# Patient Record
Sex: Male | Born: 1950 | Hispanic: Refuse to answer | Smoking: Never smoker
Health system: Southern US, Community
[De-identification: ages and names within clinical notes are randomized; demographics above are authoritative.]

## PROBLEM LIST (undated history)

## (undated) DIAGNOSIS — M1990 Unspecified osteoarthritis, unspecified site: Secondary | ICD-10-CM

## (undated) DIAGNOSIS — R569 Unspecified convulsions: Secondary | ICD-10-CM

## (undated) DIAGNOSIS — I10 Essential (primary) hypertension: Secondary | ICD-10-CM

## (undated) DIAGNOSIS — D649 Anemia, unspecified: Secondary | ICD-10-CM

## (undated) DIAGNOSIS — M2550 Pain in unspecified joint: Secondary | ICD-10-CM

## (undated) DIAGNOSIS — C61 Malignant neoplasm of prostate: Secondary | ICD-10-CM

## (undated) DIAGNOSIS — M109 Gout, unspecified: Secondary | ICD-10-CM

## (undated) HISTORY — DX: Anemia, unspecified: D64.9

## (undated) HISTORY — DX: Essential (primary) hypertension: I10

## (undated) HISTORY — DX: Pain in unspecified joint: M25.50

## (undated) HISTORY — PX: PROSTATE BIOPSY: SHX241

## (undated) HISTORY — DX: Malignant neoplasm of prostate: C61

## (undated) HISTORY — DX: Gout, unspecified: M10.9

## (undated) HISTORY — DX: Unspecified convulsions: R56.9

## (undated) HISTORY — DX: Unspecified osteoarthritis, unspecified site: M19.90

## (undated) HISTORY — PX: EYE SURGERY: SHX253

---

## 2014-02-01 ENCOUNTER — Encounter: Payer: Self-pay | Admitting: Internal Medicine

## 2014-04-11 ENCOUNTER — Telehealth: Payer: Self-pay | Admitting: Internal Medicine

## 2014-04-11 ENCOUNTER — Ambulatory Visit: Payer: Self-pay | Admitting: Internal Medicine

## 2014-04-11 NOTE — Telephone Encounter (Signed)
No charge. 

## 2014-04-11 NOTE — Telephone Encounter (Signed)
Dr Henrene Pastor do you want to charge the pt?

## 2014-04-15 ENCOUNTER — Emergency Department (HOSPITAL_COMMUNITY)
Admission: EM | Admit: 2014-04-15 | Discharge: 2014-04-15 | Disposition: A | Discharge status: Home / Self Care | Payer: Medicaid Other | Attending: Emergency Medicine | Admitting: Emergency Medicine

## 2014-04-15 ENCOUNTER — Encounter (HOSPITAL_COMMUNITY): Payer: Self-pay | Admitting: Emergency Medicine

## 2014-04-15 DIAGNOSIS — D649 Anemia, unspecified: Secondary | ICD-10-CM | POA: Diagnosis not present

## 2014-04-15 DIAGNOSIS — Z79899 Other long term (current) drug therapy: Secondary | ICD-10-CM | POA: Insufficient documentation

## 2014-04-15 DIAGNOSIS — I10 Essential (primary) hypertension: Secondary | ICD-10-CM | POA: Insufficient documentation

## 2014-04-15 DIAGNOSIS — Z791 Long term (current) use of non-steroidal anti-inflammatories (NSAID): Secondary | ICD-10-CM | POA: Insufficient documentation

## 2014-04-15 DIAGNOSIS — R569 Unspecified convulsions: Secondary | ICD-10-CM | POA: Insufficient documentation

## 2014-04-15 DIAGNOSIS — F17200 Nicotine dependence, unspecified, uncomplicated: Secondary | ICD-10-CM | POA: Diagnosis not present

## 2014-04-15 LAB — URINE MICROSCOPIC-ADD ON

## 2014-04-15 LAB — CBC WITH DIFFERENTIAL/PLATELET
BASOS ABS: 0.1 10*3/uL (ref 0.0–0.1)
Basophils Relative: 1 % (ref 0–1)
EOS ABS: 0.1 10*3/uL (ref 0.0–0.7)
Eosinophils Relative: 1 % (ref 0–5)
HCT: 42.1 % (ref 39.0–52.0)
Hemoglobin: 14.5 g/dL (ref 13.0–17.0)
LYMPHS PCT: 21 % (ref 12–46)
Lymphs Abs: 2.2 10*3/uL (ref 0.7–4.0)
MCH: 36.8 pg — ABNORMAL HIGH (ref 26.0–34.0)
MCHC: 34.4 g/dL (ref 30.0–36.0)
MCV: 106.9 fL — ABNORMAL HIGH (ref 78.0–100.0)
Monocytes Absolute: 1.3 10*3/uL — ABNORMAL HIGH (ref 0.1–1.0)
Monocytes Relative: 13 % — ABNORMAL HIGH (ref 3–12)
NEUTROS PCT: 64 % (ref 43–77)
Neutro Abs: 6.7 10*3/uL (ref 1.7–7.7)
PLATELETS: 153 10*3/uL (ref 150–400)
RBC: 3.94 MIL/uL — ABNORMAL LOW (ref 4.22–5.81)
RDW: 14.1 % (ref 11.5–15.5)
WBC: 10.3 10*3/uL (ref 4.0–10.5)

## 2014-04-15 LAB — URINALYSIS, ROUTINE W REFLEX MICROSCOPIC
Bilirubin Urine: NEGATIVE
GLUCOSE, UA: NEGATIVE mg/dL
KETONES UR: NEGATIVE mg/dL
Leukocytes, UA: NEGATIVE
Nitrite: NEGATIVE
Specific Gravity, Urine: 1.014 (ref 1.005–1.030)
Urobilinogen, UA: 1 mg/dL (ref 0.0–1.0)
pH: 5.5 (ref 5.0–8.0)

## 2014-04-15 LAB — BASIC METABOLIC PANEL
Anion gap: 35 — ABNORMAL HIGH (ref 5–15)
Anion gap: 15 (ref 5–15)
BUN: 4 mg/dL — ABNORMAL LOW (ref 6–23)
BUN: 4 mg/dL — AB (ref 6–23)
CALCIUM: 8.5 mg/dL (ref 8.4–10.5)
CHLORIDE: 100 meq/L (ref 96–112)
CO2: 11 mEq/L — ABNORMAL LOW (ref 19–32)
CO2: 23 meq/L (ref 19–32)
CREATININE: 0.86 mg/dL (ref 0.50–1.35)
Calcium: 9.1 mg/dL (ref 8.4–10.5)
Chloride: 90 mEq/L — ABNORMAL LOW (ref 96–112)
Creatinine, Ser: 0.96 mg/dL (ref 0.50–1.35)
GFR calc Af Amer: >90 mL/min (ref >90)
GFR calc Af Amer: >90 mL/min (ref >90)
GFR calc non Af Amer: 90 mL/min — ABNORMAL LOW (ref >90)
GFR, EST NON AFRICAN AMERICAN: 86 mL/min — AB (ref >90)
GLUCOSE: 230 mg/dL — AB (ref 70–99)
GLUCOSE: 102 mg/dL — AB (ref 70–99)
POTASSIUM: 3.4 meq/L — AB (ref 3.7–5.3)
Potassium: 3.4 mEq/L — ABNORMAL LOW (ref 3.7–5.3)
SODIUM: 136 meq/L — AB (ref 137–147)
Sodium: 138 mEq/L (ref 137–147)

## 2014-04-15 MED ORDER — SODIUM CHLORIDE 0.9 % IV BOLUS (SEPSIS)
1000.0000 mL | Freq: Once | INTRAVENOUS | Status: AC
  Administered 2014-04-15: 1000 mL via INTRAVENOUS

## 2014-04-15 MED ORDER — LORAZEPAM 2 MG/ML IJ SOLN
1.0000 mg | Freq: Once | INTRAMUSCULAR | Status: AC
  Administered 2014-04-15: 1 mg via INTRAVENOUS
  Filled 2014-04-15: qty 1

## 2014-04-15 NOTE — ED Notes (Signed)
Patient denies pain and is resting comfortably.  

## 2014-04-15 NOTE — ED Notes (Signed)
Patient is resting comfortably. 

## 2014-04-15 NOTE — ED Notes (Signed)
Pt provided with urinal for sample.

## 2014-04-15 NOTE — ED Provider Notes (Signed)
CSN: [id number]     Arrival date & time 04/15/14  6333 History   First MD Initiated Contact with Patient 04/15/14 [phone]     Chief Complaint  Patient presents with  . Seizures  . Hypertension     (Consider location/radiation/quality/duration/timing/severity/associated sxs/prior Treatment) Patient is a 63 y.o. male presenting with seizures.  Seizures Seizure activity on arrival: no   Seizure type:  Grand mal Episode characteristics: generalized shaking and unresponsiveness   Postictal symptoms: confusion   Return to baseline: no   Severity:  Severe Duration:  2 minutes Timing:  Clustered Number of seizures this episode:  2 Progression:  Resolved Context comment:  Heavy alcohol use.  has been throwing up for past two days. History of seizures: yes (friend reports no seizures in at least 3 or 4 years)     Past Medical History  Diagnosis Date  . Anemia   . Hypertension   . Arthralgia    Past Surgical History  Procedure Laterality Date  . Eye surgery      R eye removal   No family history on file. History  Substance Use Topics  . Smoking status: Current Some Day Smoker  . Smokeless tobacco: Not on file  . Alcohol Use: Yes     Comment: pint of alcohol daily    Review of Systems  Unable to perform ROS: Mental status change  Neurological: Positive for seizures.      Allergies  Review of patient's allergies indicates no known allergies.  Home Medications   Prior to Admission medications   Medication Sig Start Date End Date Taking? Authorizing Provider  diltiazem (TIAZAC) 240 MG 24 hr capsule Take 240 mg by mouth daily.    Historical Provider, MD  ferrous sulfate 325 (65 FE) MG tablet Take 325 mg by mouth daily with breakfast.    Historical Provider, MD  lisinopril (PRINIVIL,ZESTRIL) 20 MG tablet Take 20 mg by mouth daily.    Historical Provider, MD  naproxen (NAPROSYN) 500 MG tablet Take 500 mg by mouth 2 (two) times daily with a meal.    Historical Provider, MD    BP 160/110  Pulse 120  Temp(Src) 98.4 F (36.9 C) (Oral)  Resp 20  SpO2 98% Physical Exam  Nursing note and vitals reviewed. Constitutional: He appears well-developed and well-nourished. No distress.  HENT:  Head: Normocephalic and atraumatic.  Mouth/Throat: Oropharynx is clear and moist.  Eyes: Conjunctivae are normal. Pupils are equal, round, and reactive to light. No scleral icterus.  Neck: Neck supple.  Cardiovascular: Normal rate, regular rhythm, normal heart sounds and intact distal pulses.   No murmur heard. Pulmonary/Chest: Effort normal and breath sounds normal. No stridor. No respiratory distress. He has no wheezes. He has no rales.  Abdominal: Soft. He exhibits no distension. There is no tenderness.  Musculoskeletal: Normal range of motion. He exhibits no edema.  Neurological: He is alert. He is disoriented.  Confused  Skin: Skin is warm and dry. No rash noted.  Psychiatric: He has a normal mood and affect. His behavior is normal.    ED Course  Procedures (including critical care time) Labs Review Labs Reviewed  CBC WITH DIFFERENTIAL - Abnormal; Notable for the following:    RBC 3.94 (*)    MCV 106.9 (*)    MCH 36.8 (*)    Monocytes Relative 13 (*)    Monocytes Absolute 1.3 (*)    All other components within normal limits  BASIC METABOLIC PANEL - Abnormal; Notable for the  following:    Sodium 136 (*)    Potassium 3.4 (*)    Chloride 90 (*)    CO2 11 (*)    Glucose, Bld 230 (*)    BUN 4 (*)    GFR calc non Af Amer 86 (*)    Anion gap 35 (*)    All other components within normal limits  URINALYSIS, ROUTINE W REFLEX MICROSCOPIC - Abnormal; Notable for the following:    Hgb urine dipstick SMALL (*)    Protein, ur >300 (*)    All other components within normal limits  URINE MICROSCOPIC-ADD ON - Abnormal; Notable for the following:    Casts HYALINE CASTS (*)    All other components within normal limits  BASIC METABOLIC PANEL - Abnormal; Notable for the  following:    Potassium 3.4 (*)    Glucose, Bld 102 (*)    BUN 4 (*)    GFR calc non Af Amer 90 (*)    All other components within normal limits    Imaging Review No results found.   EKG Interpretation   Date/Time:  Friday April 15 2014 09:07:41 EDT Ventricular Rate:  120 PR Interval:  167 QRS Duration: 85 QT Interval:  327 QTC Calculation: 462 R Axis:   33 Text Interpretation:  Sinus tachycardia Probable left atrial enlargement  No old tracing to compare Confirmed by [facility]  MD, TREY ([id number]) on  04/15/2014 11:06:50 AM      MDM   Final diagnoses:  Seizure    63 yo male with a history of alcoholism and alcohol related seizures who presents after two witnessed seizures.  Pt confused but alert on my initial exam.  I called his listed home phone number and spoke with Judd Gaudier, who says he knows Mr Mierzejewski well and witnessed the seizure this morning.  He saw generalized shaking and loss of consciousness.  EMS then found him post ictal and witnessed a second generalized TC seizure.   3:39 PM Pt has returned to baseline mental status.  He and his friend report that he has been suffering from a vomiting and diarrheal illness for past few days.  I suspect he has had decreased alcohol intake secondary to this, causing his seizures.  His labs initially showed signs of metabolic acidosis, felt to be secondary to seizure and dehydration.  Repeat labs showed improvement.  He appears stable for dc with outpatient follow up.    Houston Siren III, MD 04/15/14 [phone]

## 2014-04-15 NOTE — Discharge Instructions (Signed)
Seizure, Adult °A seizure is abnormal electrical activity in the brain. Seizures usually last from 30 seconds to 2 minutes. There are various types of seizures. °Before a seizure, you may have a warning sensation (aura) that a seizure is about to occur. An aura may include the following symptoms:  °· Fear or anxiety. °· Nausea. °· Feeling like the room is spinning (vertigo). °· Vision changes, such as seeing flashing lights or spots. °Common symptoms during a seizure include: °· A change in attention or behavior (altered mental status). °· Convulsions with rhythmic jerking movements. °· Drooling. °· Rapid eye movements. °· Grunting. °· Loss of bladder and bowel control. °· Bitter taste in the mouth. °· Tongue biting. °After a seizure, you may feel confused and sleepy. You may also have an injury resulting from convulsions during the seizure. °HOME CARE INSTRUCTIONS  °· If you are given medicines, take them exactly as prescribed by your health care provider. °· Keep all follow-up appointments as directed by your health care provider. °· Do not swim or drive or engage in risky activity during which a seizure could cause further injury to you or others until your health care provider says it is OK. °· Get adequate rest. °· Teach friends and family what to do if you have a seizure. They should: °¨ Lay you on the ground to prevent a fall. °¨ Put a cushion under your head. °¨ Loosen any tight clothing around your neck. °¨ Turn you on your side. If vomiting occurs, this helps keep your airway clear. °¨ Stay with you until you recover. °¨ Know whether or not you need emergency care. °SEEK IMMEDIATE MEDICAL CARE IF: °· The seizure lasts longer than 5 minutes. °· The seizure is severe or you do not wake up immediately after the seizure. °· You have an altered mental status after the seizure. °· You are having more frequent or worsening seizures. °Someone should drive you to the emergency department or call local emergency  services (911 in [place] °MAKE SURE YOU: °· Understand these instructions. °· Will watch your condition. °· Will get help right away if you are not doing well or get worse. °Document Released: 08/02/2000 Document Revised: 05/26/2013 Document Reviewed: 03/17/2013 °ExitCare® Patient Information ©2015 [employer]. This information is not intended to replace advice given to you by your health care provider. Make sure you discuss any questions you have with your health care provider. ° °Emergency Department Resource Guide °1) Find a Doctor and Pay Out of Pocket °Although you won't have to find out who is covered by your insurance plan, it is a good idea to ask around and get recommendations. You will then need to call the office and see if the doctor you have chosen will accept you as a new patient and what types of options they offer for patients who are self-pay. Some doctors offer discounts or will set up payment plans for their patients who do not have insurance, but you will need to ask so you aren't surprised when you get to your appointment. ° °2) Contact Your Local Health Department °Not all health departments have doctors that can see patients for sick visits, but many do, so it is worth a call to see if yours does. If you don't know where your local health department is, you can check in your phone book. The CDC also has a tool to help you locate your state's health department, and many state websites also have listings of all of their   local health departments. ° °3) Find a Walk-in Clinic °If your illness is not likely to be very severe or complicated, you may want to try a walk in clinic. These are popping up all over the country in pharmacies, drugstores, and shopping centers. They're usually staffed by nurse practitioners or physician assistants that have been trained to treat common illnesses and complaints. They're usually fairly quick and inexpensive. However, if you have serious medical issues or  chronic medical problems, these are probably not your best option. ° °No Primary Care Doctor: °- Call [facility] Connect at  832-8000 - they can help you locate a primary care doctor that  accepts your insurance, provides certain services, etc. °- Physician Referral Service- 1-800-533-3463 ° °Chronic Pain Problems: °Organization         Address  Phone   Notes  °[facility]  (336) 297-2271 Patients need to be referred by their primary care doctor.  ° °Medication Assistance: °Organization         Address  Phone   Notes  °[place] [facility] [address] °[place], [place] [address] (336) 641-8030 --Must be a resident of [place] °-- Must have NO insurance coverage whatsoever (no Medicaid/ Medicare, etc.) °-- The pt. MUST have a primary care doctor that directs their care regularly and follows them in the community °  °[facility]  (866) 331-1348   °[employer]  (888) 892-1162   ° °Agencies that provide inexpensive medical care: °Organization         Address  Phone   Notes  °[facility]  (336) 832-8035   °[facility]    (336) 832-7272   °[facility] [address] °[place], [place] [address] (336) 832-4777   °[facility] [address], °[place] (336) 271-4999   °[facility]    (336) 373-0678   °[facility]    (336) 272-1050   °[facility] ° [address], [place] Phone:  (336) 832-4444, Fax:  (336) 832-4440 Hours of Operation:  9 am - 6 pm, M-F.  Also accepts Medicaid/Medicare and self-pay.  °[facility] ° [address], [place] Phone: (336) 832-3150, Fax: (336) 832-3151. Hours of Operation:  8:30 am - 5:30 pm, M-F.  Also accepts Medicaid and self-pay.  °[facility] [place] [address], [place] Phone: (336) 878-6027   °[facility] [address], [place], [place]  (336)723-1848, Ext. 123 Mondays & Thursdays: 7-9 AM.  First 15 patients are seen on a first come, first serve basis. °  ° °Medicaid-accepting [place] Providers: ° °Organization         Address  Phone   Notes  °[facility] [address], [place] (336) 641-2100 Also accepts self-pay patients.  °[facility] [address], [place] ° (336) 856-9996   °[facility] [address], [place] (336) 288-8857   °[facility] [address] [place] [address], [place] (336) 299-7000   °Veita Bland [address], [place]  ° (336) 373-1557 Only accepts [place] Access Medicaid patients after they have their name applied to their card.  ° °Self-Pay (no insurance) in [place]: ° °Organization         Address  Phone   Notes  °Sickle Cell Patients,   [facility] [address], [place] (336) 832-1970   °[facility] [address], [place] (336) 832-4400   °[facility] [place] ° [address], [place] (336) 992-4800   °[facility]/Dr. Osei-Bonsu ° [address] [place] [address], [place] or [address], [place] (336) 841-8500 Phone number for both [place] and [place] locations is the same.  °[facility] [address], [place] (336) 299-0000   °[facility] [place] [address] [place] [address], [place] or [address] (336) 852-7530 °(336) 878-2260   °[facility] [address], [place] (336) 350-1642, phone; (336) 294-5005, fax Sees patients 1st and 3rd Saturday of every month.  Must not qualify for public or private insurance (i.e. Medicaid, Medicare, [place] Health Choice, Veterans' Benefits) • Household income should be no more than 200% of the poverty level •The clinic cannot treat you if you are pregnant or think you are pregnant • Sexually transmitted  diseases are not treated at the clinic.  ° ° °Dental Care: °Organization         Address  Phone  Notes  °[facility] [address], [place] (336) 641-6152 Accepts children up to age 21 who are enrolled in Medicaid or [place] Health Choice; pregnant women with a Medicaid card; and children who have applied for Medicaid or [place] Health Choice, but were declined, whose parents can pay a reduced fee at time of service.  °[facility] [place]  [address], [place] (336) 641-7733 Accepts children up to age 21 who are enrolled in Medicaid or [place] Health Choice; pregnant women with a Medicaid card; and children who have applied for Medicaid or [place] Health Choice, but were declined, whose parents can pay a reduced fee at time of service.  °[facility] ° [address], [place] (336) 641-4533 Patients are seen by appointment only. Walk-ins are not accepted. [facility] will see patients 18 years of age and older. °Monday - Tuesday (8am-5pm) °Most Wednesdays (8:30-5pm) °$30 per visit, cash only  °[facility] ° [address], [place] (336) 641-4533 Patients are seen by appointment only. Walk-ins are not accepted. [facility] will see patients 18 years of age and older. °One Wednesday Evening (Monthly: Volunteer Based).  $30 per visit, cash only  °[facility]  (919) 537-3737 for adults; Children under age 4, call Graduate Pediatric Dentistry at (919) [phone]. Children aged 4-14, please call (919) [phone] to request a pediatric application. ° Dental services are provided in all areas of dental care including fillings, crowns and bridges, complete and partial dentures, implants, gum treatment, root canals, and extractions. Preventive care is also provided. Treatment is provided to both adults and children. °Patients are selected via a  lottery and there is often a waiting list. °  °[facility] [address], °[place] ° (336) 763-8833 [email or web address] °  °[facility] [address], [place], [place] (336)723-1848, Ext. 123 Second and Fourth Thursday of each month, opens at 6:30 AM; Clinic ends at 9 AM.  Patients are seen on a first-come first-served basis, and a limited number are seen during each clinic.  ° °[facility] ° [address], [place], [place] (336) 723-7904   Eligibility Requirements °You must   have lived in [place], or [place] counties for at least the last three months. °  You cannot be eligible for state or federal sponsored healthcare insurance, including Veterans Administration, Medicaid, or Medicare. °  You generally cannot be eligible for healthcare insurance through your employer.  °  How to apply: °Eligibility screenings are held every Tuesday and Wednesday afternoon from 1:00 pm until 4:00 pm. You do not need an appointment for the interview!  °[facility] [address], [place], [place] 336-631-2330   °[facility] [facility]  336-342-8273   °[place] [facility]  336-703-3100   °[facility]  336-570-6415   ° °Behavioral Health Resources in the Community: °Intensive Outpatient Programs °Organization         Address  Phone  Notes  °[facility] [address], [place], [place] 336-878-6098   °[facility] [address], [place], [place] 336-832-9800   °[facility] [address], [place], [place] ° 336-882-2125   °[facility] [address],  °[place], [place] 1-800-853-5163 or 336-641-4981   °Substance Abuse Resources °Organization         Address  Phone  Notes  °[facility]  336-882-2125   °[facility]  336-784-9470   °[facility]  336-285-9073   °[facility]  336-845-3988   °Residential &  Outpatient Substance Abuse Program  1-800-659-3381   °Psychological Services °Organization         Address  Phone  Notes  °[facility]  336- 832-9600   °[facility]  336- 378-7881   °[facility] [address], [place] 1-800-853-5163 or 336-641-4981   ° °Mobile Crisis Teams °Organization         Address  Phone  Notes  °[facility], Mobile Crisis Care Unit  1-877-626-1772   °Assertive °[facility] ° [address] [place], [place] 336-834-9664   °Sharon DeEsch [address] °[place] 336-554-5454   ° °Self-Help/Support Groups °Organization         Address  Phone             Notes  °[facility] [place] - variety of support groups  336- 373-1402 Call for more information  °Narcotics Anonymous (NA), [facility] [address], °[place]  2 meetings at this location  ° °Residential Treatment Programs °Organization         Address  Phone  Notes  °[facility] [address],    °[place]  1-866-801-8205   °[facility] ° [address], [place], [place] 704-293-8524   °[facility] [address], [place] 336-845-3988 Admissions: 8am-3pm M-F  °[facility] [address]    °[place], [place] 336-841-1104   °[facility] [address], [place], [place] 336-379-7146   °[facility] [address]  °[place], [place] 336-285-9073   °[facility] Intensive Outpatient [address], [place], [place] 336-852-3033   °[facility] [address]  °[place], [place] 1-877-615-2722 or 336-784-9470   °[facility] [address] [place], [place] 336-227-7417 Accepts Medicaid  °[facility] [address]  °[place]  1-800-659-3381 Substance Abuse/Addiction Treatment  ° °[place] Behavioral Health Resources °Organization          Address    Phone  Notes  °[facility]  (888) 581-9988   °Julie Brannon, PhD [address] [place], [place]   (336) 349-5553 or (336) 951-0000   °[facility]   [address] °[place], [place] (336) 349-4454   °[facility] [address], [place], [place] (336) 342-8316 Insurance/Medicaid/sponsorship through [facility]  °[facility] [address]                                    [place], [place] (336) 342-8316 Therapy/tele-psych/case  °[facility] [address]  ° [place], [place] (336) 349-2233    °Dr. Arfeen  (336) 349-4544   °[facility] [place]  [employer] [place] [facility] 1) [address],  °2) [address], [place] °3)  [address], [place] (336) 349-3220 °(336) 342-7768 ° °(336) 342-8140   °[place] [facility] (336) 342-1394 or (336) 342-3537 (After Hours)    ° ° °

## 2014-04-15 NOTE — ED Notes (Signed)
MD at bedside. 

## 2014-04-15 NOTE — ED Notes (Signed)
Called pt friend to come and answer questions and take pt home per Dr. Doy Mince

## 2014-04-15 NOTE — ED Notes (Addendum)
Pt post-ictal and having a hard time answering questions. Pt had episode of urinary incontinence. Dr Doy Mince at bedside. Pt states that he has a seizure and then goes back to bed approx every week. Pt unaware of year, month or president.Pt oriented to self, DOB and states that he is in a "doctor's office." Pt denies pain and is in NAD

## 2014-04-15 NOTE — ED Notes (Addendum)
Pt from home via EMS-Per EMS pt was post-ictal upon arrival and then had another seizure that lasted approx 2 minutes. Pt was hypertensive on scene. Pt has hx of seizures, HTN and alcoholism.

## 2014-04-15 NOTE — ED Notes (Signed)
Bed: ML46 Expected date:  Expected time:  Means of arrival:  Comments: EMS-SZ/HTN

## 2014-04-15 NOTE — ED Notes (Signed)
Judd Gaudier [phone] requests to be called when pt is ready to be d/c so that he may pick him up

## 2014-04-15 NOTE — ED Notes (Signed)
Pt family and Dr Doy Mince at bedside

## 2014-06-13 ENCOUNTER — Ambulatory Visit: Payer: Self-pay | Admitting: Internal Medicine

## 2014-06-27 NOTE — Progress Notes (Signed)
     HPI: 63 yo male for evaluation of murmur.  Current Outpatient Prescriptions  Medication Sig Dispense Refill  . diltiazem (TIAZAC) 240 MG 24 hr capsule Take 240 mg by mouth daily.    . ferrous sulfate 325 (65 FE) MG tablet Take 325 mg by mouth daily with breakfast.    . lisinopril (PRINIVIL,ZESTRIL) 20 MG tablet Take 20 mg by mouth daily.    . naproxen (NAPROSYN) 500 MG tablet Take 500 mg by mouth 2 (two) times daily with a meal.     No current facility-administered medications for this visit.    No Known Allergies  Past Medical History  Diagnosis Date  . Anemia   . Hypertension   . Arthralgia     Past Surgical History  Procedure Laterality Date  . Eye surgery      R eye removal    History   Social History  . Marital Status: Legally Separated    Spouse Name: N/A    Number of Children: N/A  . Years of Education: N/A   Occupational History  . Not on file.   Social History Main Topics  . Smoking status: Current Some Day Smoker  . Smokeless tobacco: Not on file  . Alcohol Use: Yes     Comment: pint of alcohol daily  . Drug Use: Not on file  . Sexual Activity: Not on file   Other Topics Concern  . Not on file   Social History Narrative  . No narrative on file    No family history on file.  ROS: no fevers or chills, productive cough, hemoptysis, dysphasia, odynophagia, melena, hematochezia, dysuria, hematuria, rash, seizure activity, orthopnea, PND, pedal edema, claudication. Remaining systems are negative.  Physical Exam:   There were no vitals taken for this visit.  General:  Well developed/well nourished in NAD Skin warm/dry Patient not depressed No peripheral clubbing Back-normal HEENT-normal/normal eyelids Neck supple/normal carotid upstroke bilaterally; no bruits; no JVD; no thyromegaly chest - CTA/ normal expansion CV - RRR/normal S1 and S2; no murmurs, rubs or gallops;  PMI nondisplaced Abdomen -NT/ND, no HSM, no mass, + bowel sounds, no  bruit 2+ femoral pulses, no bruits Ext-no edema, chords, 2+ DP Neuro-grossly nonfocal  ECG    This encounter was created in error - please disregard.

## 2014-06-28 ENCOUNTER — Encounter: Payer: Medicaid Other | Admitting: Cardiology

## 2014-07-01 ENCOUNTER — Telehealth: Payer: Self-pay | Admitting: Cardiology

## 2014-07-04 ENCOUNTER — Telehealth: Payer: Self-pay | Admitting: Cardiology

## 2014-07-04 NOTE — Telephone Encounter (Signed)
Received records from [facility] (Dr Kevan Ny) for appointment with Dr Stanford Breed on 09/01/14.  Records given to [facility] (medical records) for Dr Jacalyn Lefevre schedule on 09/01/14.  lp

## 2014-07-12 NOTE — Telephone Encounter (Signed)
Closed encounter °

## 2014-08-30 NOTE — Progress Notes (Signed)
     HPI: 64 yo male for evaluation of murmur. No prior cardiac history.  Current Outpatient Prescriptions  Medication Sig Dispense Refill  . diltiazem (TIAZAC) 240 MG 24 hr capsule Take 240 mg by mouth daily.    . ferrous sulfate 325 (65 FE) MG tablet Take 325 mg by mouth daily with breakfast.    . lisinopril (PRINIVIL,ZESTRIL) 20 MG tablet Take 20 mg by mouth daily.    . naproxen (NAPROSYN) 500 MG tablet Take 500 mg by mouth 2 (two) times daily with a meal.     No current facility-administered medications for this visit.    No Known Allergies  Past Medical History  Diagnosis Date  . Anemia   . Hypertension   . Arthralgia     Past Surgical History  Procedure Laterality Date  . Eye surgery      R eye removal    History   Social History  . Marital Status: Legally Separated    Spouse Name: N/A    Number of Children: N/A  . Years of Education: N/A   Occupational History  . Not on file.   Social History Main Topics  . Smoking status: Current Some Day Smoker  . Smokeless tobacco: Not on file  . Alcohol Use: Yes     Comment: pint of alcohol daily  . Drug Use: Not on file  . Sexual Activity: Not on file   Other Topics Concern  . Not on file   Social History Narrative  . No narrative on file    No family history on file.  ROS: no fevers or chills, productive cough, hemoptysis, dysphasia, odynophagia, melena, hematochezia, dysuria, hematuria, rash, seizure activity, orthopnea, PND, pedal edema, claudication. Remaining systems are negative.  Physical Exam:   There were no vitals taken for this visit.  General:  Well developed/well nourished in NAD Skin warm/dry Patient not depressed No peripheral clubbing Back-normal HEENT-normal/normal eyelids Neck supple/normal carotid upstroke bilaterally; no bruits; no JVD; no thyromegaly chest - CTA/ normal expansion CV - RRR/normal S1 and S2; no murmurs, rubs or gallops;  PMI nondisplaced Abdomen -NT/ND, no HSM, no  mass, + bowel sounds, no bruit 2+ femoral pulses, no bruits Ext-no edema, chords, 2+ DP Neuro-grossly nonfocal  ECG    This encounter was created in error - please disregard.

## 2014-09-01 ENCOUNTER — Encounter: Payer: Medicaid Other | Admitting: Cardiology

## 2014-09-14 ENCOUNTER — Encounter: Payer: Self-pay | Admitting: Cardiology

## 2014-09-15 ENCOUNTER — Other Ambulatory Visit (HOSPITAL_COMMUNITY): Payer: Self-pay | Admitting: Urology

## 2014-09-15 DIAGNOSIS — C61 Malignant neoplasm of prostate: Secondary | ICD-10-CM

## 2014-09-23 ENCOUNTER — Encounter (HOSPITAL_COMMUNITY)
Admission: RE | Admit: 2014-09-23 | Discharge: 2014-09-23 | Disposition: A | Discharge status: Home / Self Care | Payer: Medicaid Other | Source: Ambulatory Visit | Attending: Urology | Admitting: Urology

## 2014-09-23 DIAGNOSIS — C61 Malignant neoplasm of prostate: Secondary | ICD-10-CM | POA: Diagnosis not present

## 2014-09-23 DIAGNOSIS — M25561 Pain in right knee: Secondary | ICD-10-CM | POA: Insufficient documentation

## 2014-09-23 DIAGNOSIS — M25562 Pain in left knee: Secondary | ICD-10-CM | POA: Diagnosis not present

## 2014-09-23 MED ORDER — TECHNETIUM TC 99M MEDRONATE IV KIT
26.3000 | PACK | Freq: Once | INTRAVENOUS | Status: AC | PRN
  Administered 2014-09-23: 26.3 via INTRAVENOUS

## 2014-10-26 ENCOUNTER — Other Ambulatory Visit (HOSPITAL_COMMUNITY): Payer: Self-pay | Admitting: Urology

## 2014-10-26 ENCOUNTER — Ambulatory Visit (HOSPITAL_COMMUNITY)
Admission: RE | Admit: 2014-10-26 | Discharge: 2014-10-26 | Disposition: A | Discharge status: Home / Self Care | Payer: Medicaid Other | Source: Ambulatory Visit | Attending: Urology | Admitting: Urology

## 2014-10-26 DIAGNOSIS — C61 Malignant neoplasm of prostate: Secondary | ICD-10-CM

## 2014-10-26 DIAGNOSIS — M5136 Other intervertebral disc degeneration, lumbar region: Secondary | ICD-10-CM | POA: Insufficient documentation

## 2014-10-26 DIAGNOSIS — M545 Low back pain: Secondary | ICD-10-CM | POA: Insufficient documentation

## 2014-10-26 DIAGNOSIS — Z8546 Personal history of malignant neoplasm of prostate: Secondary | ICD-10-CM | POA: Diagnosis not present

## 2014-10-26 DIAGNOSIS — M419 Scoliosis, unspecified: Secondary | ICD-10-CM | POA: Diagnosis not present

## 2014-10-28 ENCOUNTER — Encounter: Payer: Self-pay | Admitting: Radiation Oncology

## 2014-10-28 NOTE — Progress Notes (Signed)
GU Location of Tumor / Histology: prostatic adenocarcinoma  If Prostate Cancer, Gleason Score is (3 + 4) and PSA is (13.56)  Shawn Hendrix presented 06/22/2014 with an elevated PSA of 13.58. [facility] referred patient to Dr. Janice Norrie.  Biopsies of prostate (if applicable) revealed:    Past/Anticipated interventions by urology, if any: patient not interested in radial prostatectomy thus was referred to Dr. Tammi Klippel for consideration of radiotherapy  Past/Anticipated interventions by medical oncology, if any: no  Weight changes, if any: no  Bowel/Bladder complaints, if any: yes, nocturia x3. Denies dysuria or hematuria.   Nausea/Vomiting, if any: no  Pain issues, if any:  no  SAFETY ISSUES:  Prior radiation? no  Pacemaker/ICD? no  Possible current pregnancy? no  Is the patient on methotrexate? no  Current Complaints / other details:  64 year old male. Divorced. Father of one daughter. PROSTATE VOLUME 25.19. Bone scan negative.

## 2014-10-31 ENCOUNTER — Ambulatory Visit
Admission: RE | Admit: 2014-10-31 | Discharge: 2014-10-31 | Disposition: A | Discharge status: Home / Self Care | Payer: Medicare Other | Source: Ambulatory Visit | Attending: Radiation Oncology | Admitting: Radiation Oncology

## 2014-10-31 ENCOUNTER — Encounter: Payer: Self-pay | Admitting: Radiation Oncology

## 2014-10-31 VITALS — BP 143/98 | HR 87 | Temp 98.0°F | Resp 16 | Ht 62.0 in | Wt 115.1 lb

## 2014-10-31 DIAGNOSIS — C61 Malignant neoplasm of prostate: Secondary | ICD-10-CM | POA: Insufficient documentation

## 2014-10-31 DIAGNOSIS — Z510 Encounter for antineoplastic radiation therapy: Secondary | ICD-10-CM | POA: Diagnosis not present

## 2014-10-31 DIAGNOSIS — D649 Anemia, unspecified: Secondary | ICD-10-CM | POA: Diagnosis not present

## 2014-10-31 DIAGNOSIS — Z791 Long term (current) use of non-steroidal anti-inflammatories (NSAID): Secondary | ICD-10-CM | POA: Insufficient documentation

## 2014-10-31 DIAGNOSIS — I10 Essential (primary) hypertension: Secondary | ICD-10-CM | POA: Insufficient documentation

## 2014-10-31 NOTE — Progress Notes (Signed)
Radiation Oncology         [phone] ________________________________  Initial outpatient Consultation  Name: Shawn Hendrix MRN: [id number]  Date: 10/31/2014  DOB: 04-01-1951  CC:No primary care provider on file.  Lowella Bandy, MD   REFERRING PHYSICIAN: Lowella Bandy, MD  DIAGNOSIS: 64 y.o. gentleman with stage T1c adenocarcinoma of the prostate with a Gleason's score of 3+4 and a PSA of 13.56    ICD-9-CM ICD-10-CM   1. Prostate ca 185 C61   2. Malignant neoplasm of prostate [place] ILLNESS::Shawn Hendrix is a 64 y.o. gentleman.  He was noted to have an elevated PSA of 13.56 by his primary care physician, at the [facility].  Accordingly, he was referred for evaluation in urology by Dr. Janice Norrie on 08/01/14,  digital rectal examination was performed at that time revealing a 3+ gland with no nodules.  The patient proceeded to transrectal ultrasound with 12 biopsies of the prostate on 09/07/14.  The prostate volume measured 25.19 cc.  Out of 12 core biopsies, 11 were positive.  The maximum Gleason score was 3+4.  The patient reviewed the biopsy results with his urologist and he has kindly been referred today for discussion of potential radiation treatment options.  PREVIOUS RADIATION THERAPY: No  PAST MEDICAL HISTORY:  has a past medical history of Anemia; Hypertension; Arthralgia; Prostate cancer; Arthritis; Gout; and Seizure.    PAST SURGICAL HISTORY: Past Surgical History  Procedure Laterality Date  . Eye surgery      R eye removal  . Prostate biopsy      FAMILY HISTORY: family history includes Cancer in his sister and sister.  SOCIAL HISTORY:  reports that he has never smoked. He has never used smokeless tobacco. He reports that he drinks alcohol. He reports that he does not use illicit drugs.  ALLERGIES: Review of patient's allergies indicates no known allergies.  MEDICATIONS:  Current Outpatient Prescriptions  Medication Sig Dispense  Refill  . diltiazem (TIAZAC) 240 MG 24 hr capsule Take 240 mg by mouth daily.    . ferrous sulfate 325 (65 FE) MG tablet Take 325 mg by mouth daily with breakfast.    . lisinopril (PRINIVIL,ZESTRIL) 20 MG tablet Take 20 mg by mouth daily.    . meloxicam (MOBIC) 7.5 MG tablet Take 7.5 mg by mouth daily.  3  . naproxen (NAPROSYN) 500 MG tablet Take 500 mg by mouth 2 (two) times daily with a meal.    . VOLTAREN 1 % GEL   1   No current facility-administered medications for this encounter.    REVIEW OF SYSTEMS:  A 15 point review of systems is documented in the electronic medical record. This was obtained by the nursing staff. However, I reviewed this with the patient to discuss relevant findings and make appropriate changes.  A comprehensive review of systems was negative..  The patient completed an IPSS and IIEF questionnaire.  His IPSS score was 1 indicating mild urinary outflow obstructive symptoms.  He indicated that his erectile function is able to complete sexual activity much less than half the time.   PHYSICAL EXAM: This patient is in no acute distress.  He is alert and oriented.   height is 5\' 2"  (1.575 m) and weight is 115 lb 1.6 oz (52.209 kg). His oral temperature is 98 F (36.7 C). His blood pressure is 143/98 and his pulse is 87. His respiration is 16 and oxygen saturation is 100%.  He  exhibits no respiratory distress or labored breathing.  He appears neurologically intact.  His mood is pleasant.  His affect is appropriate.  Please note the digital rectal exam findings described above.  KPS = 100  100 - Normal; no complaints; no evidence of disease. 90   - Able to carry on normal activity; minor signs or symptoms of disease. 80   - Normal activity with effort; some signs or symptoms of disease. 70   - Cares for self; unable to carry on normal activity or to do active work. 60   - Requires occasional assistance, but is able to care for most of his personal needs. 50   - Requires  considerable assistance and frequent medical care. 50   - Disabled; requires special care and assistance. 56   - Severely disabled; hospital admission is indicated although death not imminent. 73   - Very sick; hospital admission necessary; active supportive treatment necessary. 10   - Moribund; fatal processes progressing rapidly. 0     - Dead  Karnofsky DA, Abelmann [place], Craver LS and Burchenal [facility] [phone]) The use of the nitrogen mustards in the palliative treatment of carcinoma: with particular reference to bronchogenic carcinoma Cancer 1 634-56   LABORATORY DATA:  Lab Results  Component Value Date   WBC 10.3 04/15/2014   HGB 14.5 04/15/2014   HCT 42.1 04/15/2014   MCV 106.9* 04/15/2014   PLT 153 04/15/2014   Lab Results  Component Value Date   NA 138 04/15/2014   K 3.4* 04/15/2014   CL 100 04/15/2014   CO2 23 04/15/2014   No results found for: ALT, AST, GGT, ALKPHOS, BILITOT   RADIOGRAPHY: Dg Lumbar Spine Complete  10/26/2014   CLINICAL DATA:  History of prostate carcinoma, left-sided low back pain  EXAM: LUMBAR SPINE - COMPLETE 4+ VIEW  COMPARISON:  Bone scan of 09/23/2014  FINDINGS: There is a lumbar scoliosis convex to the left by approximately 19 degrees. Degenerative disc disease is noted at T12-L1, L1-2, and L2-3 levels with some loss of disc space and osteophyte formation with sclerosis. These degenerative changes account for the increased activity noted in the mid upper lumbar spine by bone scan. However no lytic or blastic lesion is seen and no compression deformity is noted. The SI joints are corticated.  IMPRESSION: 1. Degenerative change at T12-L1, L1-2, and L2-3 levels with loss of disc space and spurring with sclerosis. 2. Lumbar scoliosis convex left by 19 degrees. 3. No lytic or blastic bony lesion is seen.   Electronically Signed   By: Ivar Drape M.D.   On: 10/26/2014 09:02      IMPRESSION: This gentleman is a 64 y.o. gentleman with stage T1c adenocarcinoma of the  prostate with a Gleason's score of 3+4 and a PSA of 13.56.  His Gleason's Score and PSA both put him into the intermediate risk group.  Accordingly he is eligible for a variety of potential treatment options including radical prostatectomy, or externally radiotherapy with or without androgen deprivation, with or without prostate seed implant boost.  PLAN:Today I reviewed the findings and workup thus far.  We discussed the natural history of prostate cancer.  We reviewed the the implications of T-stage, Gleason's Score, and PSA on decision-making and outcomes in prostate cancer.  We discussed radiation treatment in the management of prostate cancer with regard to the logistics and delivery of external beam radiation treatment as well as the logistics and delivery of prostate brachytherapy.  We compared and contrasted  each of these approaches and also compared these against prostatectomy.  The patient expressed interest in external beam radiotherapy.   We also spent time discussing androgen deprivation in conjunction with radiotherapy. This form of therapy is shown to provide an overall survival benefit and prostate cancer free survival benefit for high-risk patients. For intermediate risk patients, the impact of concurrent androgen deprivation is less clear. There is suggestion that androgen deprivation improves prostate cancer free survival but may have less of an impact on overall survival. In this subset of patients, selection of whether to use androgen deprivation should be tailored to the individual patient based on personal preference and quality of life in addition to prostate cancer disease factors. In this instance, I am concerned about the high volume of prostate cancer within the gland with 11 out of 12 core biopsies positive. We talked about androgen deprivation today and the possible side effects associated with this treatment. The patient is not opposed to implying a short course (4-6 months) of  androgen deprivation beginning 2 months prior to external radiation and continuing during and possibly after radiation.  The patient would like to proceed with androgen deprivation in conjunction with prostate IMRT.  I will share my findings with Dr. Janice Norrie and move forward with scheduling a discussion on androgen deprivation followed by placement of three gold fiducial markers into the prostate to proceed with IMRT 2 months after the initiation of androgen deprivation.     I enjoyed meeting with him today, and will look forward to participating in the care of this very nice gentleman.   I spent 60 minutes face to face with the patient and more than 50% of that time was spent in counseling and/or coordination of care.   ------------------------------------------------  Sheral Apley. Tammi Klippel, M.D.

## 2014-10-31 NOTE — Progress Notes (Signed)
See progress note under physician encounter. 

## 2014-11-01 ENCOUNTER — Telehealth: Payer: Self-pay | Admitting: *Deleted

## 2014-11-01 NOTE — Telephone Encounter (Signed)
CALLED PATIENT TO INFORM OF GOLD SEED PLACEMENT ON 12-09-14 - ARRIVAL TIME- 3:15 PM @ DR. Janice Norrie' OFFICE AND HIS SIM ON 12-16-14 @ 2 PM @ DR. MANNING'S OFFICE, AND DR. NESI' NURSE - RUTH WILL SET UP THIS PATIENT HORMONE INJECTION - SOMETIME THIS WEEK, RUTH WILL CALL THIS PATIENT WITH THIS APPT, SPOKE WITH PATIENT AND HE IS AWARE OF THESE APPTS.

## 2014-12-16 ENCOUNTER — Ambulatory Visit
Admission: RE | Admit: 2014-12-16 | Discharge: 2014-12-16 | Disposition: A | Discharge status: Home / Self Care | Payer: Medicare Other | Source: Ambulatory Visit | Attending: Radiation Oncology | Admitting: Radiation Oncology

## 2014-12-16 DIAGNOSIS — Z510 Encounter for antineoplastic radiation therapy: Secondary | ICD-10-CM | POA: Diagnosis not present

## 2014-12-16 DIAGNOSIS — C61 Malignant neoplasm of prostate: Secondary | ICD-10-CM

## 2014-12-16 NOTE — Progress Notes (Signed)
  Radiation Oncology         [phone] ________________________________  Name: Shawn Hendrix MRN: [id number]  Date: 12/16/2014  DOB: 1950/11/04  SIMULATION AND TREATMENT PLANNING NOTE    ICD-9-CM ICD-10-CM   1. Malignant neoplasm of prostate 185 C61     DIAGNOSIS:  64 y.o. gentleman with stage T1c adenocarcinoma of the prostate with a Gleason's score of 3+4 and a PSA of 13.56  NARRATIVE:  The patient was brought to the [facility].  Identity was confirmed.  All relevant records and images related to the planned course of therapy were reviewed.  The patient freely provided informed written consent to proceed with treatment after reviewing the details related to the planned course of therapy. The consent form was witnessed and verified by the simulation staff.  Then, the patient was set-up in a stable reproducible supine position for radiation therapy.  A vacuum lock pillow device was custom fabricated to position his legs in a reproducible immobilized position.  Then, I performed a urethrogram under sterile conditions to identify the prostatic apex.  CT images were obtained.  Surface markings were placed.  The CT images were loaded into the planning software.  Then the prostate target and avoidance structures including the rectum, bladder, bowel and hips were contoured.  Treatment planning then occurred.  The radiation prescription was entered and confirmed.  A total of 1 complex treatment device was fabricated. I have requested : Intensity Modulated Radiotherapy (IMRT) is medically necessary for this case for the following reason:  Rectal sparing.Marland Kitchen  PLAN:  The patient will receive 78 Gy in 40 fractions.  This document serves as a record of services personally performed by Tyler Pita, MD. It was created on his behalf by Darcus Austin, a trained medical scribe. The creation of this record is based on the scribe's personal observations and the provider's statements to them.  This document has been checked and approved by the attending provider.     ________________________________  Sheral Apley. Tammi Klippel, M.D.

## 2014-12-21 DIAGNOSIS — Z510 Encounter for antineoplastic radiation therapy: Secondary | ICD-10-CM | POA: Diagnosis not present

## 2014-12-22 DIAGNOSIS — Z510 Encounter for antineoplastic radiation therapy: Secondary | ICD-10-CM | POA: Diagnosis not present

## 2014-12-23 DIAGNOSIS — Z510 Encounter for antineoplastic radiation therapy: Secondary | ICD-10-CM | POA: Diagnosis not present

## 2014-12-27 ENCOUNTER — Ambulatory Visit
Admission: RE | Admit: 2014-12-27 | Discharge: 2014-12-27 | Disposition: A | Discharge status: Home / Self Care | Payer: Medicare Other | Source: Ambulatory Visit | Attending: Radiation Oncology | Admitting: Radiation Oncology

## 2014-12-27 DIAGNOSIS — Z510 Encounter for antineoplastic radiation therapy: Secondary | ICD-10-CM | POA: Diagnosis not present

## 2014-12-28 ENCOUNTER — Ambulatory Visit
Admission: RE | Admit: 2014-12-28 | Discharge: 2014-12-28 | Disposition: A | Discharge status: Home / Self Care | Payer: Medicare Other | Source: Ambulatory Visit | Attending: Radiation Oncology | Admitting: Radiation Oncology

## 2014-12-29 ENCOUNTER — Ambulatory Visit
Admission: RE | Admit: 2014-12-29 | Discharge: 2014-12-29 | Disposition: A | Discharge status: Home / Self Care | Payer: Medicare Other | Source: Ambulatory Visit | Attending: Radiation Oncology | Admitting: Radiation Oncology

## 2014-12-29 DIAGNOSIS — Z510 Encounter for antineoplastic radiation therapy: Secondary | ICD-10-CM | POA: Diagnosis not present

## 2014-12-30 ENCOUNTER — Ambulatory Visit
Admission: RE | Admit: 2014-12-30 | Discharge: 2014-12-30 | Disposition: A | Discharge status: Home / Self Care | Payer: Medicaid Other | Source: Ambulatory Visit | Attending: Radiation Oncology | Admitting: Radiation Oncology

## 2014-12-30 ENCOUNTER — Ambulatory Visit
Admission: RE | Admit: 2014-12-30 | Discharge: 2014-12-30 | Disposition: A | Discharge status: Home / Self Care | Payer: Medicare Other | Source: Ambulatory Visit | Attending: Radiation Oncology | Admitting: Radiation Oncology

## 2014-12-30 ENCOUNTER — Encounter: Payer: Self-pay | Admitting: Radiation Oncology

## 2014-12-30 VITALS — BP 173/107 | HR 90 | Resp 16 | Wt 116.7 lb

## 2014-12-30 DIAGNOSIS — Z510 Encounter for antineoplastic radiation therapy: Secondary | ICD-10-CM | POA: Diagnosis not present

## 2014-12-30 DIAGNOSIS — C61 Malignant neoplasm of prostate: Secondary | ICD-10-CM

## 2014-12-30 NOTE — Progress Notes (Signed)
  Radiation Oncology         [phone] ________________________________  Name: Shawn Hendrix MRN: [id number]  Date: 12/30/2014  DOB: 22-Feb-1951  Weekly Radiation Therapy Management    ICD-9-CM ICD-10-CM   1. Malignant neoplasm of prostate 185 C61     Current Dose: 5.85 Gy     Planned Dose:  78 Gy  Narrative . . . . . . . . The patient presents for routine under treatment assessment.                                   The patient is without complaint.                                 Set-up films were reviewed.                                 The chart was checked. Physical Findings. . .  weight is 116 lb 11.2 oz (52.935 kg). His blood pressure is 173/107 and his pulse is 90. His respiration is 16. . Weight essentially stable.  No significant changes. Impression . . . . . . . The patient is tolerating radiation. Plan . . . . . . . . . . . . Continue treatment as planned.  ________________________________  Sheral Apley. Tammi Klippel, M.D.

## 2014-12-30 NOTE — Progress Notes (Signed)
Weight stable. Denies pain. Reports nocturia x 2. Denies dysuria, hematuria, or diarrhea. Denies incontinence or leakage. Denies urgency. Denies fatigue. BP elevated. Advised patient to take BP medication when he arrives home since he forgot to take it this morning. Patient verbalized understanding. Post sim education completed.

## 2014-12-30 NOTE — Progress Notes (Signed)
Completed post sim education with the patient. Oriented patient to staff and routine of the clinic. Provided patient with RADIATION THERAPY AND YOU handbook then, reviewed pertinent information. Educated patient reference potential side effects and management such as fatigue, urinary/bladder changes and diarrhea. Patient verbalized understanding of all reviewed.

## 2015-01-02 ENCOUNTER — Ambulatory Visit
Admission: RE | Admit: 2015-01-02 | Discharge: 2015-01-02 | Disposition: A | Discharge status: Home / Self Care | Payer: Medicare Other | Source: Ambulatory Visit | Attending: Radiation Oncology | Admitting: Radiation Oncology

## 2015-01-02 DIAGNOSIS — Z510 Encounter for antineoplastic radiation therapy: Secondary | ICD-10-CM | POA: Diagnosis not present

## 2015-01-03 ENCOUNTER — Ambulatory Visit
Admission: RE | Admit: 2015-01-03 | Discharge: 2015-01-03 | Disposition: A | Discharge status: Home / Self Care | Payer: Medicare Other | Source: Ambulatory Visit | Attending: Radiation Oncology | Admitting: Radiation Oncology

## 2015-01-03 DIAGNOSIS — Z510 Encounter for antineoplastic radiation therapy: Secondary | ICD-10-CM | POA: Diagnosis not present

## 2015-01-04 ENCOUNTER — Ambulatory Visit
Admission: RE | Admit: 2015-01-04 | Discharge: 2015-01-04 | Disposition: A | Discharge status: Home / Self Care | Payer: Medicare Other | Source: Ambulatory Visit | Attending: Radiation Oncology | Admitting: Radiation Oncology

## 2015-01-04 DIAGNOSIS — Z510 Encounter for antineoplastic radiation therapy: Secondary | ICD-10-CM | POA: Diagnosis not present

## 2015-01-05 ENCOUNTER — Encounter: Payer: Self-pay | Admitting: Radiation Oncology

## 2015-01-05 ENCOUNTER — Ambulatory Visit
Admission: RE | Admit: 2015-01-05 | Discharge: 2015-01-05 | Disposition: A | Discharge status: Home / Self Care | Payer: Medicaid Other | Source: Ambulatory Visit | Attending: Radiation Oncology | Admitting: Radiation Oncology

## 2015-01-05 ENCOUNTER — Ambulatory Visit
Admission: RE | Admit: 2015-01-05 | Discharge: 2015-01-05 | Disposition: A | Discharge status: Home / Self Care | Payer: Medicare Other | Source: Ambulatory Visit | Attending: Radiation Oncology | Admitting: Radiation Oncology

## 2015-01-05 VITALS — BP 134/92 | HR 84 | Resp 16 | Wt 117.0 lb

## 2015-01-05 DIAGNOSIS — Z510 Encounter for antineoplastic radiation therapy: Secondary | ICD-10-CM | POA: Diagnosis not present

## 2015-01-05 DIAGNOSIS — C61 Malignant neoplasm of prostate: Secondary | ICD-10-CM

## 2015-01-05 NOTE — Progress Notes (Signed)
  Radiation Oncology         [phone] ________________________________  Name: Shawn Hendrix MRN: [id number]  Date: 01/05/2015  DOB: 11/17/1950    Weekly Radiation Therapy Management    ICD-9-CM ICD-10-CM   1. Malignant neoplasm of prostate 185 C61     Current Dose: 13.65 Gy     Planned Dose:  78 Gy  Narrative . . . . . . . . The patient presents for routine under treatment assessment.                                   Weight stable. BP slightly elevated. Reports nocturia x2. Denies dysuria, hematuria or diarrhea. Denies incontinence or leakage. Denies urgency. Denies fatigue. BP slightly elevated.                                 Set-up films were reviewed.                                 The chart was checked. Physical Findings. . .  weight is 117 lb (53.071 kg). His blood pressure is 134/92 and his pulse is 84. His respiration is 16. . Weight essentially stable.  No significant changes. Impression . . . . . . . The patient is tolerating radiation. Plan . . . . . . . . . . . . Continue treatment as planned.  This document serves as a record of services personally performed by Tyler Pita, MD. It was created on his behalf by Arlyce Harman, a trained medical scribe. The creation of this record is based on the scribe's personal observations and the provider's statements to them. This document has been checked and approved by the attending provider.     ________________________________  Sheral Apley. Tammi Klippel, M.D.

## 2015-01-05 NOTE — Progress Notes (Signed)
Weight stable. BP slightly elevated. Reports nocturia x2. Denies dysuria, hematuria or diarrhea. Denies incontinence or leakage. Denies urgency. Denies fatigue. BP slightly elevated.

## 2015-01-06 ENCOUNTER — Ambulatory Visit
Admission: RE | Admit: 2015-01-06 | Discharge: 2015-01-06 | Disposition: A | Discharge status: Home / Self Care | Payer: Medicare Other | Source: Ambulatory Visit | Attending: Radiation Oncology | Admitting: Radiation Oncology

## 2015-01-06 DIAGNOSIS — Z510 Encounter for antineoplastic radiation therapy: Secondary | ICD-10-CM | POA: Diagnosis not present

## 2015-01-09 ENCOUNTER — Ambulatory Visit
Admission: RE | Admit: 2015-01-09 | Discharge: 2015-01-09 | Disposition: A | Discharge status: Home / Self Care | Payer: Medicare Other | Source: Ambulatory Visit | Attending: Radiation Oncology | Admitting: Radiation Oncology

## 2015-01-09 DIAGNOSIS — Z510 Encounter for antineoplastic radiation therapy: Secondary | ICD-10-CM | POA: Diagnosis not present

## 2015-01-10 ENCOUNTER — Ambulatory Visit
Admission: RE | Admit: 2015-01-10 | Discharge: 2015-01-10 | Disposition: A | Discharge status: Home / Self Care | Payer: Medicare Other | Source: Ambulatory Visit | Attending: Radiation Oncology | Admitting: Radiation Oncology

## 2015-01-10 DIAGNOSIS — Z510 Encounter for antineoplastic radiation therapy: Secondary | ICD-10-CM | POA: Diagnosis not present

## 2015-01-11 ENCOUNTER — Ambulatory Visit
Admission: RE | Admit: 2015-01-11 | Discharge: 2015-01-11 | Disposition: A | Discharge status: Home / Self Care | Payer: Medicare Other | Source: Ambulatory Visit | Attending: Radiation Oncology | Admitting: Radiation Oncology

## 2015-01-11 DIAGNOSIS — Z510 Encounter for antineoplastic radiation therapy: Secondary | ICD-10-CM | POA: Diagnosis not present

## 2015-01-12 ENCOUNTER — Ambulatory Visit
Admission: RE | Admit: 2015-01-12 | Discharge: 2015-01-12 | Disposition: A | Discharge status: Home / Self Care | Payer: Medicare Other | Source: Ambulatory Visit | Attending: Radiation Oncology | Admitting: Radiation Oncology

## 2015-01-12 DIAGNOSIS — Z510 Encounter for antineoplastic radiation therapy: Secondary | ICD-10-CM | POA: Diagnosis not present

## 2015-01-13 ENCOUNTER — Encounter: Payer: Self-pay | Admitting: Radiation Oncology

## 2015-01-13 ENCOUNTER — Ambulatory Visit
Admission: RE | Admit: 2015-01-13 | Discharge: 2015-01-13 | Disposition: A | Discharge status: Home / Self Care | Payer: Medicaid Other | Source: Ambulatory Visit | Attending: Radiation Oncology | Admitting: Radiation Oncology

## 2015-01-13 ENCOUNTER — Ambulatory Visit
Admission: RE | Admit: 2015-01-13 | Discharge: 2015-01-13 | Disposition: A | Discharge status: Home / Self Care | Payer: Medicare Other | Source: Ambulatory Visit | Attending: Radiation Oncology | Admitting: Radiation Oncology

## 2015-01-13 VITALS — BP 153/108 | HR 93 | Resp 16 | Wt 115.8 lb

## 2015-01-13 DIAGNOSIS — C61 Malignant neoplasm of prostate: Secondary | ICD-10-CM

## 2015-01-13 DIAGNOSIS — Z510 Encounter for antineoplastic radiation therapy: Secondary | ICD-10-CM | POA: Diagnosis not present

## 2015-01-13 NOTE — Progress Notes (Signed)
Weight stable. Denies pain. Denies dysuria, hematuria, diarrhea, or incontinence. Describes a strong steady urine stream without difficulty emptying. Denies urgency. BP elevated. Patient forgot to take bp medication this morning but, plans to when he arrives home.   BP 153/108 mmHg  Pulse 93  Resp 16  Wt 115 lb 12.8 oz (52.527 kg) Wt Readings from Last 3 Encounters:  No data found for [employer]

## 2015-01-15 NOTE — Progress Notes (Signed)
  Radiation Oncology         [phone] ________________________________  Name: Shawn Hendrix MRN: [id number]  Date: 01/13/2015  DOB: [birth year]    Weekly Radiation Therapy Management    ICD-9-CM ICD-10-CM   1. Malignant neoplasm of prostate 185 C61     Current Dose: 25.35 Gy     Planned Dose:  78 Gy  Narrative:  The patient presents for routine under treatment assessment.    Weight stable. Denies pain. Denies dysuria, hematuria, diarrhea, or incontinence. Describes a strong steady urine stream without difficulty emptying. Denies urgency. BP elevated. Patient forgot to take bp medication this morning but, plans to when he arrives home.                           Set-up films were reviewed. The chart was checked. Physical Findings:  weight is 115 lb 12.8 oz (52.527 kg). His blood pressure is 153/108 and his pulse is 93. His respiration is 16. . Weight essentially stable.  No significant changes. Impression: The patient is tolerating radiation. Plan: Continue treatment as planned.    This document serves as a record of services personally performed by Tyler Pita, MD. It was created on his behalf by Lenn Cal, a trained medical scribe. The creation of this record is based on the scribe's personal observations and the provider's statements to them. This document has been checked and approved by the attending provider.    ________________________________  Sheral Apley. Tammi Klippel, M.D.

## 2015-01-17 ENCOUNTER — Ambulatory Visit
Admission: RE | Admit: 2015-01-17 | Discharge: 2015-01-17 | Disposition: A | Discharge status: Home / Self Care | Payer: Medicare Other | Source: Ambulatory Visit | Attending: Radiation Oncology | Admitting: Radiation Oncology

## 2015-01-17 DIAGNOSIS — Z510 Encounter for antineoplastic radiation therapy: Secondary | ICD-10-CM | POA: Diagnosis not present

## 2015-01-18 ENCOUNTER — Ambulatory Visit
Admission: RE | Admit: 2015-01-18 | Discharge: 2015-01-18 | Disposition: A | Discharge status: Home / Self Care | Payer: Medicare Other | Source: Ambulatory Visit | Attending: Radiation Oncology | Admitting: Radiation Oncology

## 2015-01-18 DIAGNOSIS — C61 Malignant neoplasm of prostate: Secondary | ICD-10-CM | POA: Diagnosis not present

## 2015-01-18 DIAGNOSIS — Z791 Long term (current) use of non-steroidal anti-inflammatories (NSAID): Secondary | ICD-10-CM | POA: Diagnosis not present

## 2015-01-18 DIAGNOSIS — I10 Essential (primary) hypertension: Secondary | ICD-10-CM | POA: Diagnosis not present

## 2015-01-18 DIAGNOSIS — Z510 Encounter for antineoplastic radiation therapy: Secondary | ICD-10-CM | POA: Diagnosis not present

## 2015-01-18 DIAGNOSIS — D649 Anemia, unspecified: Secondary | ICD-10-CM | POA: Diagnosis not present

## 2015-01-19 ENCOUNTER — Ambulatory Visit
Admission: RE | Admit: 2015-01-19 | Discharge: 2015-01-19 | Disposition: A | Discharge status: Home / Self Care | Payer: Medicare Other | Source: Ambulatory Visit | Attending: Radiation Oncology | Admitting: Radiation Oncology

## 2015-01-19 DIAGNOSIS — Z510 Encounter for antineoplastic radiation therapy: Secondary | ICD-10-CM | POA: Diagnosis not present

## 2015-01-20 ENCOUNTER — Encounter: Payer: Self-pay | Admitting: Radiation Oncology

## 2015-01-20 ENCOUNTER — Ambulatory Visit
Admission: RE | Admit: 2015-01-20 | Discharge: 2015-01-20 | Disposition: A | Discharge status: Home / Self Care | Payer: Medicare Other | Source: Ambulatory Visit | Attending: Radiation Oncology | Admitting: Radiation Oncology

## 2015-01-20 VITALS — BP 89/65 | HR 78 | Resp 16 | Wt 113.8 lb

## 2015-01-20 DIAGNOSIS — C61 Malignant neoplasm of prostate: Secondary | ICD-10-CM

## 2015-01-20 DIAGNOSIS — Z510 Encounter for antineoplastic radiation therapy: Secondary | ICD-10-CM | POA: Diagnosis not present

## 2015-01-20 NOTE — Progress Notes (Signed)
  Radiation Oncology         [phone] ________________________________  Name: Shawn Hendrix MRN: [id number]  Date: 01/20/2015  DOB: September 08, 1950    Weekly Radiation Therapy Management    ICD-9-CM ICD-10-CM   1. Malignant neoplasm of prostate 185 C61     Current Dose: 33.15 Gy     Planned Dose:  78 Gy  Narrative:  The patient presents for routine under treatment assessment. Weight stable. Pt denies dysuria, hematuria, nocturia, diarrhea, or incontinence. Describes a strong steady urine stream without difficulty emptying. Denies urgency. Denies pain. Denies fatigue.                           Set-up films were reviewed. The chart was checked.  Physical Findings:  weight is 113 lb 12.8 oz (51.619 kg). His blood pressure is 89/65 and his pulse is 78. His respiration is 16. . Weight essentially stable.  No significant changes.  Impression: The patient is tolerating radiation.  Plan: Continue treatment as planned.  This document serves as a record of services personally performed by Tyler Pita, MD. It was created on his behalf by Darcus Austin, a trained medical scribe. The creation of this record is based on the scribe's personal observations and the provider's statements to them. This document has been checked and approved by the attending provider.    ________________________________  Sheral Apley. Tammi Klippel, M.D.

## 2015-01-20 NOTE — Progress Notes (Signed)
Weight stable. BP low. Denies dysuria, hematuria, nocturia, diarrhea, or incontinence. Describes a strong steady urine stream without difficulty emptying. Denies urgency. Denies pain. Denies fatigue.   BP 89/65 mmHg  Pulse 78  Resp 16  Wt 113 lb 12.8 oz (51.619 kg)

## 2015-01-23 ENCOUNTER — Ambulatory Visit
Admission: RE | Admit: 2015-01-23 | Discharge: 2015-01-23 | Disposition: A | Discharge status: Home / Self Care | Payer: Medicare Other | Source: Ambulatory Visit | Attending: Radiation Oncology | Admitting: Radiation Oncology

## 2015-01-23 DIAGNOSIS — Z510 Encounter for antineoplastic radiation therapy: Secondary | ICD-10-CM | POA: Diagnosis not present

## 2015-01-24 ENCOUNTER — Ambulatory Visit
Admission: RE | Admit: 2015-01-24 | Discharge: 2015-01-24 | Disposition: A | Discharge status: Home / Self Care | Payer: Medicare Other | Source: Ambulatory Visit | Attending: Radiation Oncology | Admitting: Radiation Oncology

## 2015-01-24 DIAGNOSIS — Z510 Encounter for antineoplastic radiation therapy: Secondary | ICD-10-CM | POA: Diagnosis not present

## 2015-01-25 ENCOUNTER — Ambulatory Visit
Admission: RE | Admit: 2015-01-25 | Discharge: 2015-01-25 | Disposition: A | Discharge status: Home / Self Care | Payer: Medicare Other | Source: Ambulatory Visit | Attending: Radiation Oncology | Admitting: Radiation Oncology

## 2015-01-25 DIAGNOSIS — Z510 Encounter for antineoplastic radiation therapy: Secondary | ICD-10-CM | POA: Diagnosis not present

## 2015-01-26 ENCOUNTER — Ambulatory Visit
Admission: RE | Admit: 2015-01-26 | Discharge: 2015-01-26 | Disposition: A | Discharge status: Home / Self Care | Payer: Medicaid Other | Source: Ambulatory Visit | Attending: Radiation Oncology | Admitting: Radiation Oncology

## 2015-01-26 ENCOUNTER — Ambulatory Visit
Admission: RE | Admit: 2015-01-26 | Discharge: 2015-01-26 | Disposition: A | Discharge status: Home / Self Care | Payer: Medicare Other | Source: Ambulatory Visit | Attending: Radiation Oncology | Admitting: Radiation Oncology

## 2015-01-26 ENCOUNTER — Encounter: Payer: Self-pay | Admitting: Radiation Oncology

## 2015-01-26 VITALS — BP 125/84 | HR 77 | Temp 98.3°F | Resp 20 | Wt 114.8 lb

## 2015-01-26 DIAGNOSIS — C61 Malignant neoplasm of prostate: Secondary | ICD-10-CM

## 2015-01-26 DIAGNOSIS — Z510 Encounter for antineoplastic radiation therapy: Secondary | ICD-10-CM | POA: Diagnosis not present

## 2015-01-26 NOTE — Progress Notes (Signed)
  Radiation Oncology         [phone] ________________________________  Name: Shawn Hendrix MRN: [id number]  Date: 01/26/2015  DOB: Nov 08, 1950    Weekly Radiation Therapy Management    ICD-9-CM ICD-10-CM   1. Malignant neoplasm of prostate 185 C61     Current Dose:  40.95 Gy     Planned Dose:  78 Gy  Narrative: The patient presents for routine under treatment assessment. Weight stable. Weekly rad txs prostate 21/40 completed, no dysuria,hematuria,no freqency or urgency good stream, no nocturia, regular bowel movements,  Appetite good, no fatigue stated. Set-up films were reviewed. The chart was checked. Pt. has no complaints.  Physical Findings: BP 125/84 mmHg  Pulse 77  Temp(Src) 98.3 F (36.8 C) (Oral)  Resp 20  Wt 114 lb 12.8 oz (52.073 kg)  Weight essentially stable.  No significant changes.  Wt Readings from Last 3 Encounters:  01/26/15 114 lb 12.8 oz (52.073 kg)  01/20/15 113 lb 12.8 oz (51.619 kg)  01/13/15 115 lb 12.8 oz (52.527 kg)    Impression: The patient is tolerating radiation.  Plan: Continue treatment as planned.  This document serves as a record of services personally performed by Tyler Pita, MD. It was created on his behalf by Jeralene Peters, a trained medical scribe. The creation of this record is based on the scribe's personal observations and the provider's statements to them. This document has been checked and approved by the attending provider.        ________________________________  Sheral Apley. Tammi Klippel, M.D.

## 2015-01-26 NOTE — Progress Notes (Signed)
Weekly rad txs prostate 21/40 completed, no dysuria,hematuria,no freqency or urgency good stream, no nocturia, regular bowel movements,  Appetite good, no fatigue stated 10:56 AM BP 125/84 mmHg  Pulse 77  Temp(Src) 98.3 F (36.8 C) (Oral)  Resp 20  Wt 114 lb 12.8 oz (52.073 kg)  Wt Readings from Last 3 Encounters:  01/26/15 114 lb 12.8 oz (52.073 kg)  01/20/15 113 lb 12.8 oz (51.619 kg)  01/13/15 115 lb 12.8 oz (52.527 kg)

## 2015-01-27 ENCOUNTER — Ambulatory Visit
Admission: RE | Admit: 2015-01-27 | Discharge: 2015-01-27 | Disposition: A | Discharge status: Home / Self Care | Payer: Medicare Other | Source: Ambulatory Visit | Attending: Radiation Oncology | Admitting: Radiation Oncology

## 2015-01-27 DIAGNOSIS — Z510 Encounter for antineoplastic radiation therapy: Secondary | ICD-10-CM | POA: Diagnosis not present

## 2015-01-30 ENCOUNTER — Ambulatory Visit
Admission: RE | Admit: 2015-01-30 | Discharge: 2015-01-30 | Disposition: A | Discharge status: Home / Self Care | Payer: Medicare Other | Source: Ambulatory Visit | Attending: Radiation Oncology | Admitting: Radiation Oncology

## 2015-01-30 DIAGNOSIS — Z510 Encounter for antineoplastic radiation therapy: Secondary | ICD-10-CM | POA: Diagnosis not present

## 2015-01-31 ENCOUNTER — Ambulatory Visit
Admission: RE | Admit: 2015-01-31 | Discharge: 2015-01-31 | Disposition: A | Discharge status: Home / Self Care | Payer: Medicare Other | Source: Ambulatory Visit | Attending: Radiation Oncology | Admitting: Radiation Oncology

## 2015-01-31 DIAGNOSIS — Z510 Encounter for antineoplastic radiation therapy: Secondary | ICD-10-CM | POA: Diagnosis not present

## 2015-02-01 ENCOUNTER — Ambulatory Visit
Admission: RE | Admit: 2015-02-01 | Discharge: 2015-02-01 | Disposition: A | Discharge status: Home / Self Care | Payer: Medicare Other | Source: Ambulatory Visit | Attending: Radiation Oncology | Admitting: Radiation Oncology

## 2015-02-01 DIAGNOSIS — Z510 Encounter for antineoplastic radiation therapy: Secondary | ICD-10-CM | POA: Diagnosis not present

## 2015-02-02 ENCOUNTER — Ambulatory Visit
Admission: RE | Admit: 2015-02-02 | Discharge: 2015-02-02 | Disposition: A | Discharge status: Home / Self Care | Payer: Medicare Other | Source: Ambulatory Visit | Attending: Radiation Oncology | Admitting: Radiation Oncology

## 2015-02-02 DIAGNOSIS — Z510 Encounter for antineoplastic radiation therapy: Secondary | ICD-10-CM | POA: Diagnosis not present

## 2015-02-03 ENCOUNTER — Ambulatory Visit
Admission: RE | Admit: 2015-02-03 | Discharge: 2015-02-03 | Disposition: A | Discharge status: Home / Self Care | Payer: Medicaid Other | Source: Ambulatory Visit | Attending: Radiation Oncology | Admitting: Radiation Oncology

## 2015-02-03 ENCOUNTER — Encounter: Payer: Self-pay | Admitting: Radiation Oncology

## 2015-02-03 ENCOUNTER — Ambulatory Visit
Admission: RE | Admit: 2015-02-03 | Discharge: 2015-02-03 | Disposition: A | Discharge status: Home / Self Care | Payer: Medicare Other | Source: Ambulatory Visit | Attending: Radiation Oncology | Admitting: Radiation Oncology

## 2015-02-03 VITALS — BP 110/82 | HR 93 | Resp 16 | Wt 110.2 lb

## 2015-02-03 DIAGNOSIS — Z510 Encounter for antineoplastic radiation therapy: Secondary | ICD-10-CM | POA: Diagnosis not present

## 2015-02-03 DIAGNOSIS — C61 Malignant neoplasm of prostate: Secondary | ICD-10-CM

## 2015-02-03 NOTE — Progress Notes (Signed)
Weight and vitals stable. Denies pain. Denies dysuria or hematuria. Denies frequency or urgency. Describes a strong steady urine stream. Denies diarrhea. Denies fatigue.  BP 110/82 mmHg  Pulse 93  Resp 16  Wt 110 lb 3.2 oz (49.986 kg) Wt Readings from Last 3 Encounters:  02/03/15 110 lb 3.2 oz (49.986 kg)  01/26/15 114 lb 12.8 oz (52.073 kg)  01/20/15 113 lb 12.8 oz (51.619 kg)

## 2015-02-03 NOTE — Progress Notes (Signed)
  Radiation Oncology         [phone] ________________________________  Name: Shawn Hendrix MRN: [id number]  Date: 02/03/2015  DOB: 07/16/1951    Weekly Radiation Therapy Management    ICD-9-CM ICD-10-CM   1. Malignant neoplasm of prostate 185 C61     Current Dose: 52.65 Gy     Planned Dose:  78 Gy  Narrative:  The patient presents for routine under treatment assessment. Weight and vitals stable. Denies pain. Denies dysuria or hematuria. Denies frequency or urgency. Describes a strong steady urine stream. Denies diarrhea. Denies fatigue.                           Set-up films were reviewed. The chart was checked.  Physical Findings:  weight is 110 lb 3.2 oz (49.986 kg). His blood pressure is 110/82 and his pulse is 93. His respiration is 16. . Weight essentially stable.  No significant changes.  Impression: The patient is tolerating radiation.  Plan: Continue treatment as planned.  This document serves as a record of services personally performed by Tyler Pita, MD. It was created on his behalf by Darcus Austin, a trained medical scribe. The creation of this record is based on the scribe's personal observations and the provider's statements to them. This document has been checked and approved by the attending provider.    ________________________________  Sheral Apley. Tammi Klippel, M.D.

## 2015-02-06 ENCOUNTER — Ambulatory Visit
Admission: RE | Admit: 2015-02-06 | Discharge: 2015-02-06 | Disposition: A | Discharge status: Home / Self Care | Payer: Medicare Other | Source: Ambulatory Visit | Attending: Radiation Oncology | Admitting: Radiation Oncology

## 2015-02-06 DIAGNOSIS — Z510 Encounter for antineoplastic radiation therapy: Secondary | ICD-10-CM | POA: Diagnosis not present

## 2015-02-07 ENCOUNTER — Ambulatory Visit
Admission: RE | Admit: 2015-02-07 | Discharge: 2015-02-07 | Disposition: A | Discharge status: Home / Self Care | Payer: Medicare Other | Source: Ambulatory Visit | Attending: Radiation Oncology | Admitting: Radiation Oncology

## 2015-02-07 DIAGNOSIS — Z510 Encounter for antineoplastic radiation therapy: Secondary | ICD-10-CM | POA: Diagnosis not present

## 2015-02-08 ENCOUNTER — Ambulatory Visit
Admission: RE | Admit: 2015-02-08 | Discharge: 2015-02-08 | Disposition: A | Discharge status: Home / Self Care | Payer: Medicare Other | Source: Ambulatory Visit | Attending: Radiation Oncology | Admitting: Radiation Oncology

## 2015-02-08 DIAGNOSIS — Z510 Encounter for antineoplastic radiation therapy: Secondary | ICD-10-CM | POA: Diagnosis not present

## 2015-02-09 ENCOUNTER — Encounter: Payer: Self-pay | Admitting: Radiation Oncology

## 2015-02-09 ENCOUNTER — Ambulatory Visit
Admission: RE | Admit: 2015-02-09 | Discharge: 2015-02-09 | Disposition: A | Discharge status: Home / Self Care | Payer: Medicaid Other | Source: Ambulatory Visit | Attending: Radiation Oncology | Admitting: Radiation Oncology

## 2015-02-09 ENCOUNTER — Ambulatory Visit
Admission: RE | Admit: 2015-02-09 | Discharge: 2015-02-09 | Disposition: A | Discharge status: Home / Self Care | Payer: Medicare Other | Source: Ambulatory Visit | Attending: Radiation Oncology | Admitting: Radiation Oncology

## 2015-02-09 VITALS — BP 115/79 | HR 83 | Temp 98.3°F | Resp 16 | Wt 114.7 lb

## 2015-02-09 DIAGNOSIS — C61 Malignant neoplasm of prostate: Secondary | ICD-10-CM

## 2015-02-09 DIAGNOSIS — Z510 Encounter for antineoplastic radiation therapy: Secondary | ICD-10-CM | POA: Diagnosis not present

## 2015-02-09 NOTE — Progress Notes (Signed)
Weekly rad txs prostate 31/40, no dysuria,hematuria  , no c/o bladder or bowels, regular bowel movements, appetite good, takes daily nap, no pain 10:44 AM BP 115/79 mmHg  Pulse 83  Temp(Src) 98.3 F (36.8 C) (Oral)  Resp 16  Wt 114 lb 11.2 oz (52.028 kg)  Wt Readings from Last 3 Encounters:  02/09/15 114 lb 11.2 oz (52.028 kg)  02/03/15 110 lb 3.2 oz (49.986 kg)  01/26/15 114 lb 12.8 oz (52.073 kg)  Gaspar Garbe, RN II Rad/Onc

## 2015-02-09 NOTE — Progress Notes (Signed)
  Radiation Oncology         [phone] ________________________________  Name: Shawn Hendrix MRN: [id number]  Date: 02/09/2015  DOB: [birth year]    Weekly Radiation Therapy Management    ICD-9-CM ICD-10-CM   1. Malignant neoplasm of prostate 185 C61     Current Dose: 60.45 Gy     Planned Dose:  78 Gy  Narrative:  The patient presents for routine under treatment assessment. Weekly rad txs prostate 31/40. He denies dysuria, hematuria, and bladder or bowel issues. Regular bowel movements, appetite good, takes daily nap, no pain.                           Set-up films were reviewed. The chart was checked.  Physical Findings:  weight is 114 lb 11.2 oz (52.028 kg). His oral temperature is 98.3 F (36.8 C). His blood pressure is 115/79 and his pulse is 83. His respiration is 16. . Weight essentially stable.  No significant changes.  Impression: The patient is tolerating radiation.  Plan: Continue treatment as planned.  This document serves as a record of services personally performed by Tyler Pita, MD. It was created on his behalf by Darcus Austin, a trained medical scribe. The creation of this record is based on the scribe's personal observations and the provider's statements to them. This document has been checked and approved by the attending provider.    ________________________________  Sheral Apley. Tammi Klippel, M.D.

## 2015-02-10 ENCOUNTER — Ambulatory Visit
Admission: RE | Admit: 2015-02-10 | Discharge: 2015-02-10 | Disposition: A | Discharge status: Home / Self Care | Payer: Medicare Other | Source: Ambulatory Visit | Attending: Radiation Oncology | Admitting: Radiation Oncology

## 2015-02-10 DIAGNOSIS — Z510 Encounter for antineoplastic radiation therapy: Secondary | ICD-10-CM | POA: Diagnosis not present

## 2015-02-13 ENCOUNTER — Ambulatory Visit
Admission: RE | Admit: 2015-02-13 | Discharge: 2015-02-13 | Disposition: A | Discharge status: Home / Self Care | Payer: Medicare Other | Source: Ambulatory Visit | Attending: Radiation Oncology | Admitting: Radiation Oncology

## 2015-02-13 DIAGNOSIS — Z510 Encounter for antineoplastic radiation therapy: Secondary | ICD-10-CM | POA: Diagnosis not present

## 2015-02-14 ENCOUNTER — Ambulatory Visit
Admission: RE | Admit: 2015-02-14 | Discharge: 2015-02-14 | Disposition: A | Discharge status: Home / Self Care | Payer: Medicare Other | Source: Ambulatory Visit | Attending: Radiation Oncology | Admitting: Radiation Oncology

## 2015-02-14 DIAGNOSIS — Z510 Encounter for antineoplastic radiation therapy: Secondary | ICD-10-CM | POA: Diagnosis not present

## 2015-02-15 ENCOUNTER — Ambulatory Visit
Admission: RE | Admit: 2015-02-15 | Discharge: 2015-02-15 | Disposition: A | Discharge status: Home / Self Care | Payer: Medicare Other | Source: Ambulatory Visit | Attending: Radiation Oncology | Admitting: Radiation Oncology

## 2015-02-15 DIAGNOSIS — Z510 Encounter for antineoplastic radiation therapy: Secondary | ICD-10-CM | POA: Diagnosis not present

## 2015-02-16 ENCOUNTER — Ambulatory Visit
Admission: RE | Admit: 2015-02-16 | Discharge: 2015-02-16 | Disposition: A | Discharge status: Home / Self Care | Payer: Medicare Other | Source: Ambulatory Visit | Attending: Radiation Oncology | Admitting: Radiation Oncology

## 2015-02-16 ENCOUNTER — Ambulatory Visit
Admission: RE | Admit: 2015-02-16 | Discharge: 2015-02-16 | Disposition: A | Discharge status: Home / Self Care | Payer: Medicaid Other | Source: Ambulatory Visit | Attending: Radiation Oncology | Admitting: Radiation Oncology

## 2015-02-16 ENCOUNTER — Encounter: Payer: Self-pay | Admitting: Radiation Oncology

## 2015-02-16 VITALS — BP 144/90 | HR 79 | Resp 16 | Wt 113.2 lb

## 2015-02-16 DIAGNOSIS — Z510 Encounter for antineoplastic radiation therapy: Secondary | ICD-10-CM | POA: Diagnosis not present

## 2015-02-16 DIAGNOSIS — C61 Malignant neoplasm of prostate: Secondary | ICD-10-CM

## 2015-02-16 NOTE — Progress Notes (Signed)
  Radiation Oncology         [phone] ________________________________  Name: Shawn Hendrix MRN: [id number]  Date: 02/16/2015  DOB: 08-Dec-1950    Weekly Radiation Therapy Management  Diagnosis: Shawn Hendrix is a 64 year old gentleman presenting to clinic in regards to his malignant neoplasm of the prostate.  Current Dose: 70.2 Gy     Planned Dose:  78 Gy  Narrative: The patient presents for routine under treatment assessment. The patient's weight and vitals are stable. He reports nocturia x 2. The patient denies dysuria, hematuria, diarrhea, or fatigue. Additionally, he denies incontinence or leakage. Fitzgibbon's left eye was closed shut, he did not produce clear speech, and had trouble getting up from his chair.  Physical Findings:  weight is 113 lb 3.2 oz (51.347 kg). His blood pressure is 144/90 and his pulse is 79. His respiration is 16. . The patient's weight is currently essentially stable with no significant changes in overall health were noted. The patient is alert and oriented X3.  Impression: Shawn Hendrix is a 64 year old gentleman presenting to clinic in regards to his malignant neoplasm of the prostate. The patient is tolerating radiation.  Plan: The patient was advised to continue treatment as planned.  He was made aware of his appointment with radiation oncology next week.  This document serves as a record of services personally performed by Tyler Pita, MD. It was created on his behalf by Lenn Cal, a trained medical scribe. The creation of this record is based on the scribe's personal observations and the provider's statements to them. This document has been checked and approved by the attending provider.  ________________________________  Sheral Apley. Tammi Klippel, M.D.

## 2015-02-16 NOTE — Progress Notes (Signed)
Weight and vitals stable. Denies pain. Reports nocturia x 2. Denies dysuria or hematuria. Denies diarrhea. Denies fatigue. Denies incontinence or leakage.   BP 144/90 mmHg  Pulse 79  Resp 16  Wt 113 lb 3.2 oz (51.347 kg) Wt Readings from Last 3 Encounters:  02/16/15 113 lb 3.2 oz (51.347 kg)  02/09/15 114 lb 11.2 oz (52.028 kg)  02/03/15 110 lb 3.2 oz (49.986 kg)

## 2015-02-17 ENCOUNTER — Ambulatory Visit
Admission: RE | Admit: 2015-02-17 | Discharge: 2015-02-17 | Disposition: A | Discharge status: Home / Self Care | Payer: Medicare Other | Source: Ambulatory Visit | Attending: Radiation Oncology | Admitting: Radiation Oncology

## 2015-02-17 DIAGNOSIS — C61 Malignant neoplasm of prostate: Secondary | ICD-10-CM | POA: Diagnosis present

## 2015-02-21 ENCOUNTER — Ambulatory Visit
Admission: RE | Admit: 2015-02-21 | Discharge: 2015-02-21 | Disposition: A | Discharge status: Home / Self Care | Payer: Medicare Other | Source: Ambulatory Visit | Attending: Radiation Oncology | Admitting: Radiation Oncology

## 2015-02-21 DIAGNOSIS — C61 Malignant neoplasm of prostate: Secondary | ICD-10-CM | POA: Diagnosis not present

## 2015-02-22 ENCOUNTER — Ambulatory Visit: Payer: Medicare Other

## 2015-02-22 ENCOUNTER — Ambulatory Visit
Admission: RE | Admit: 2015-02-22 | Discharge: 2015-02-22 | Disposition: A | Discharge status: Home / Self Care | Payer: Medicare Other | Source: Ambulatory Visit | Attending: Radiation Oncology | Admitting: Radiation Oncology

## 2015-02-22 DIAGNOSIS — C61 Malignant neoplasm of prostate: Secondary | ICD-10-CM | POA: Diagnosis not present

## 2015-02-23 ENCOUNTER — Ambulatory Visit
Admission: RE | Admit: 2015-02-23 | Discharge: 2015-02-23 | Disposition: A | Discharge status: Home / Self Care | Payer: Medicare Other | Source: Ambulatory Visit | Attending: Radiation Oncology | Admitting: Radiation Oncology

## 2015-02-23 ENCOUNTER — Encounter: Payer: Self-pay | Admitting: Radiation Oncology

## 2015-02-23 VITALS — BP 136/92 | HR 99 | Resp 16 | Wt 111.2 lb

## 2015-02-23 DIAGNOSIS — C61 Malignant neoplasm of prostate: Secondary | ICD-10-CM | POA: Diagnosis not present

## 2015-02-23 NOTE — Progress Notes (Signed)
Weight and vitals stable. Denies pain. Reports nocturia x2. Denies dysuria or hematuria. Denies diarrhea. Denies fatigue. Denies leakage. Completed treatment today. One month follow up appointment card given.  BP 136/92 mmHg  Pulse 99  Resp 16  Wt 111 lb 3.2 oz (50.44 kg) Wt Readings from Last 3 Encounters:  02/23/15 111 lb 3.2 oz (50.44 kg)  02/16/15 113 lb 3.2 oz (51.347 kg)  02/09/15 114 lb 11.2 oz (52.028 kg)

## 2015-02-23 NOTE — Progress Notes (Signed)
  Radiation Oncology         [phone] ________________________________  Name: Shawn Hendrix MRN: [id number]  Date: 02/23/2015  DOB: March 11, 1951    Weekly Radiation Therapy Management  Diagnosis: Shawn Hendrix is a 64 year old gentleman presenting to clinic in regards to his malignant neoplasm of the prostate.  Narrative: The patient presents for routine under treatment assessment. The patient rung the bell today!  Denies pain. Reports nocturia x 2. Denies dysuria or hematuria. Denies diarrhea. Denies fatigue. Denies incontinence or leakage.   Physical Findings:  weight is 111 lb 3.2 oz (50.44 kg). His blood pressure is 136/92 and his pulse is 99. His respiration is 16. . The patient's weight is currently essentially stable with no significant changes in overall health were noted. The patient is alert and oriented x3. Weight and vitals stable.   Impression: Shawn Hendrix is a 64 year old gentleman presenting to clinic in regards to his malignant neoplasm of the prostate. The patient is tolerating radiation.  Plan: The patient completes treatment as planned.  He was made aware of his appointment with radiation oncology next month. He was made aware of future appointment to be scheduled with Dr. Janice Norrie and educated on what to expect with his future PSA levels. He is always welcome to contact Dr. Tammi Klippel, MD with any further questions or concerns.  This document serves as a record of services personally performed by Tyler Pita, MD. It was created on his behalf by Lenn Cal, a trained medical scribe. The creation of this record is based on the scribe's personal observations and the provider's statements to them. This document has been checked and approved by the attending provider.  ________________________________  Sheral Apley. Tammi Klippel, M.D.

## 2015-03-07 NOTE — Progress Notes (Signed)
  Radiation Oncology         [phone]) [phone] ________________________________  Name: Shawn Hendrix MRN: [id number]  Date: 02/23/2015  DOB: 1951/01/30  End of Treatment Note     ICD-9-CM ICD-10-CM   1. Malignant neoplasm of prostate 185 C61     DIAGNOSIS: 64 y.o. gentleman with stage T1c adenocarcinoma of the prostate with a Gleason's score of 3+4 and a PSA of 13.56     Indication for treatment:  Curative, Definitive Radiotherapy       Radiation treatment dates:   12/27/2014-02/23/2015  Site/dose:   The prostate was treated to 78 Gy in 40 fractions of 1.95 Gy  Beams/energy:   The patient was treated with IMRT using volumetric arc therapy delivering 6 MV X-rays to clockwise and counterclockwise circumferential arcs with a 90 degree collimator offset to avoid dose scalloping.  Image guidance was performed with daily cone beam CT prior to each fraction to align to gold markers in the prostate and assure proper bladder and rectal fill volumes.  Immobilization was achieved with Body[employer] custom mold.  Narrative: The patient tolerated radiation treatment relatively well.   The patient experienced some minor urinary irritation and modest fatigue.  Denies pain. Reports nocturia x2. Denies dysuria or hematuria. Denies diarrhea. Denies fatigue. Denies leakage. Completed treatment on schedule.  Plan: The patient has completed radiation treatment. He will return to radiation oncology clinic for routine followup in one month. I advised him to call or return sooner if he has any questions or concerns related to his recovery or treatment. ________________________________  Sheral Apley. Tammi Klippel, M.D.

## 2015-04-06 ENCOUNTER — Ambulatory Visit: Admission: RE | Admit: 2015-04-06 | Payer: Medicaid Other | Source: Ambulatory Visit | Admitting: Radiation Oncology

## 2016-12-02 ENCOUNTER — Emergency Department (HOSPITAL_COMMUNITY): Payer: Medicare Other

## 2016-12-02 ENCOUNTER — Inpatient Hospital Stay (HOSPITAL_COMMUNITY)
Admission: EM | Admit: 2016-12-02 | Discharge: 2016-12-08 | DRG: 897 | Disposition: A | Discharge status: Skilled Nursing Facility | Payer: Medicare Other | Attending: Family Medicine | Admitting: Family Medicine

## 2016-12-02 ENCOUNTER — Encounter (HOSPITAL_COMMUNITY): Payer: Self-pay

## 2016-12-02 DIAGNOSIS — F10230 Alcohol dependence with withdrawal, uncomplicated: Secondary | ICD-10-CM

## 2016-12-02 DIAGNOSIS — Z9001 Acquired absence of eye: Secondary | ICD-10-CM | POA: Diagnosis not present

## 2016-12-02 DIAGNOSIS — D649 Anemia, unspecified: Secondary | ICD-10-CM | POA: Diagnosis present

## 2016-12-02 DIAGNOSIS — Z8546 Personal history of malignant neoplasm of prostate: Secondary | ICD-10-CM

## 2016-12-02 DIAGNOSIS — M109 Gout, unspecified: Secondary | ICD-10-CM | POA: Diagnosis present

## 2016-12-02 DIAGNOSIS — Z681 Body mass index (BMI) 19 or less, adult: Secondary | ICD-10-CM

## 2016-12-02 DIAGNOSIS — K709 Alcoholic liver disease, unspecified: Secondary | ICD-10-CM | POA: Diagnosis present

## 2016-12-02 DIAGNOSIS — M6281 Muscle weakness (generalized): Secondary | ICD-10-CM | POA: Diagnosis present

## 2016-12-02 DIAGNOSIS — I10 Essential (primary) hypertension: Secondary | ICD-10-CM | POA: Diagnosis present

## 2016-12-02 DIAGNOSIS — R509 Fever, unspecified: Secondary | ICD-10-CM

## 2016-12-02 DIAGNOSIS — R569 Unspecified convulsions: Secondary | ICD-10-CM | POA: Diagnosis present

## 2016-12-02 DIAGNOSIS — R2689 Other abnormalities of gait and mobility: Secondary | ICD-10-CM | POA: Diagnosis present

## 2016-12-02 DIAGNOSIS — Z79899 Other long term (current) drug therapy: Secondary | ICD-10-CM | POA: Diagnosis not present

## 2016-12-02 DIAGNOSIS — F10239 Alcohol dependence with withdrawal, unspecified: Principal | ICD-10-CM | POA: Diagnosis present

## 2016-12-02 DIAGNOSIS — E440 Moderate protein-calorie malnutrition: Secondary | ICD-10-CM | POA: Diagnosis present

## 2016-12-02 DIAGNOSIS — M1990 Unspecified osteoarthritis, unspecified site: Secondary | ICD-10-CM | POA: Diagnosis present

## 2016-12-02 DIAGNOSIS — E876 Hypokalemia: Secondary | ICD-10-CM | POA: Diagnosis present

## 2016-12-02 DIAGNOSIS — R2681 Unsteadiness on feet: Secondary | ICD-10-CM | POA: Diagnosis present

## 2016-12-02 DIAGNOSIS — R41841 Cognitive communication deficit: Secondary | ICD-10-CM | POA: Diagnosis present

## 2016-12-02 DIAGNOSIS — D696 Thrombocytopenia, unspecified: Secondary | ICD-10-CM | POA: Diagnosis present

## 2016-12-02 LAB — HEPATIC FUNCTION PANEL
ALBUMIN: 3.4 g/dL — AB (ref 3.5–5.0)
ALT: 22 U/L (ref 17–63)
AST: 64 U/L — AB (ref 15–41)
Alkaline Phosphatase: 68 U/L (ref 38–126)
BILIRUBIN INDIRECT: 3.1 mg/dL — AB (ref 0.3–0.9)
Bilirubin, Direct: 0.7 mg/dL — ABNORMAL HIGH (ref 0.1–0.5)
TOTAL PROTEIN: 6.7 g/dL (ref 6.5–8.1)
Total Bilirubin: 3.8 mg/dL — ABNORMAL HIGH (ref 0.3–1.2)

## 2016-12-02 LAB — BASIC METABOLIC PANEL
ANION GAP: 15 (ref 5–15)
CHLORIDE: 97 mmol/L — AB (ref 101–111)
CO2: 24 mmol/L (ref 22–32)
Calcium: 8.5 mg/dL — ABNORMAL LOW (ref 8.9–10.3)
Creatinine, Ser: 0.97 mg/dL (ref 0.61–1.24)
GFR calc Af Amer: >60 mL/min (ref >60)
GLUCOSE: 86 mg/dL (ref 65–99)
POTASSIUM: 3 mmol/L — AB (ref 3.5–5.1)
Sodium: 136 mmol/L (ref 135–145)

## 2016-12-02 LAB — URINALYSIS, ROUTINE W REFLEX MICROSCOPIC
Bacteria, UA: NONE SEEN
Bilirubin Urine: NEGATIVE
GLUCOSE, UA: NEGATIVE mg/dL
Ketones, ur: 5 mg/dL — AB
Leukocytes, UA: NEGATIVE
Nitrite: NEGATIVE
PROTEIN: 100 mg/dL — AB
SPECIFIC GRAVITY, URINE: 1.01 (ref 1.005–1.030)
Squamous Epithelial / LPF: NONE SEEN
pH: 6 (ref 5.0–8.0)

## 2016-12-02 LAB — POC OCCULT BLOOD, ED: Fecal Occult Bld: NEGATIVE

## 2016-12-02 LAB — CBC
HCT: 22.9 % — ABNORMAL LOW (ref 39.0–52.0)
HEMOGLOBIN: 7.6 g/dL — AB (ref 13.0–17.0)
MCH: 38.2 pg — AB (ref 26.0–34.0)
MCHC: 33.2 g/dL (ref 30.0–36.0)
MCV: 115.1 fL — ABNORMAL HIGH (ref 78.0–100.0)
PLATELETS: 56 10*3/uL — AB (ref 150–400)
RBC: 1.99 MIL/uL — ABNORMAL LOW (ref 4.22–5.81)
RDW: 16.1 % — ABNORMAL HIGH (ref 11.5–15.5)
WBC: 5.2 10*3/uL (ref 4.0–10.5)

## 2016-12-02 LAB — MAGNESIUM: Magnesium: 1.3 mg/dL — ABNORMAL LOW (ref 1.7–2.4)

## 2016-12-02 LAB — RAPID URINE DRUG SCREEN, HOSP PERFORMED
AMPHETAMINES: NOT DETECTED
BENZODIAZEPINES: NOT DETECTED
Barbiturates: NOT DETECTED
Cocaine: NOT DETECTED
Opiates: NOT DETECTED
TETRAHYDROCANNABINOL: NOT DETECTED

## 2016-12-02 LAB — DIFFERENTIAL
BASOS ABS: 0 10*3/uL (ref 0.0–0.1)
Basophils Relative: 0 %
EOS ABS: 0 10*3/uL (ref 0.0–0.7)
Eosinophils Relative: 0 %
LYMPHS ABS: 0.4 10*3/uL — AB (ref 0.7–4.0)
LYMPHS PCT: 7 %
MONO ABS: 0.6 10*3/uL (ref 0.1–1.0)
Monocytes Relative: 12 %
Neutro Abs: 4.2 10*3/uL (ref 1.7–7.7)
Neutrophils Relative %: 81 %

## 2016-12-02 LAB — ETHANOL

## 2016-12-02 LAB — CBG MONITORING, ED: GLUCOSE-CAPILLARY: 118 mg/dL — AB (ref 65–99)

## 2016-12-02 MED ORDER — ACETAMINOPHEN 650 MG RE SUPP: 650.0000 mg | Freq: Four times a day (QID) | RECTAL | Status: DC | PRN

## 2016-12-02 MED ORDER — FOLIC ACID 5 MG/ML IJ SOLN
Freq: Once | INTRAMUSCULAR | Status: AC
Start: 2016-12-02 — End: 2016-12-02
  Administered 2016-12-02: 17:00:00 via INTRAVENOUS
  Filled 2016-12-02: qty 1000

## 2016-12-02 MED ORDER — LORAZEPAM 2 MG/ML IJ SOLN
0.0000 mg | Freq: Four times a day (QID) | INTRAMUSCULAR | Status: AC
  Administered 2016-12-04: 2 mg via INTRAVENOUS
  Filled 2016-12-02: qty 1

## 2016-12-02 MED ORDER — SODIUM CHLORIDE 0.9% FLUSH
3.0000 mL | Freq: Two times a day (BID) | INTRAVENOUS | Status: DC
  Administered 2016-12-02 – 2016-12-08 (×12): 3 mL via INTRAVENOUS

## 2016-12-02 MED ORDER — LORAZEPAM 2 MG/ML IJ SOLN
2.0000 mg | Freq: Once | INTRAMUSCULAR | Status: AC
  Administered 2016-12-02: 2 mg via INTRAVENOUS
  Filled 2016-12-02: qty 1

## 2016-12-02 MED ORDER — SODIUM CHLORIDE 0.9 % IV SOLN
30.0000 meq | Freq: Once | INTRAVENOUS | Status: AC
  Administered 2016-12-02: 30 meq via INTRAVENOUS
  Filled 2016-12-02: qty 15

## 2016-12-02 MED ORDER — ACETAMINOPHEN 325 MG PO TABS
650.0000 mg | ORAL_TABLET | Freq: Four times a day (QID) | ORAL | Status: DC | PRN
  Administered 2016-12-06 – 2016-12-07 (×3): 650 mg via ORAL
  Filled 2016-12-02 (×3): qty 2

## 2016-12-02 MED ORDER — ONDANSETRON HCL 4 MG PO TABS: 4.0000 mg | ORAL_TABLET | Freq: Four times a day (QID) | ORAL | Status: DC | PRN

## 2016-12-02 MED ORDER — ONDANSETRON HCL 4 MG/2ML IJ SOLN: 4.0000 mg | Freq: Four times a day (QID) | INTRAMUSCULAR | Status: DC | PRN

## 2016-12-02 MED ORDER — POTASSIUM CHLORIDE IN NACL 40-0.9 MEQ/L-% IV SOLN
INTRAVENOUS | Status: AC
  Administered 2016-12-02: 100 mL/h via INTRAVENOUS
  Filled 2016-12-02 (×2): qty 1000

## 2016-12-02 MED ORDER — SODIUM CHLORIDE 0.9 % IV BOLUS (SEPSIS)
1000.0000 mL | Freq: Once | INTRAVENOUS | Status: AC
Start: 2016-12-02 — End: 2016-12-02
  Administered 2016-12-02: 1000 mL via INTRAVENOUS

## 2016-12-02 MED ORDER — PANTOPRAZOLE SODIUM 40 MG PO TBEC
40.0000 mg | DELAYED_RELEASE_TABLET | Freq: Every day | ORAL | Status: DC
  Administered 2016-12-03 – 2016-12-08 (×6): 40 mg via ORAL
  Filled 2016-12-02 (×6): qty 1

## 2016-12-02 MED ORDER — FERROUS SULFATE 325 (65 FE) MG PO TABS
325.0000 mg | ORAL_TABLET | Freq: Every day | ORAL | Status: DC
  Administered 2016-12-03 – 2016-12-08 (×6): 325 mg via ORAL
  Filled 2016-12-02 (×6): qty 1

## 2016-12-02 MED ORDER — LORAZEPAM 2 MG/ML IJ SOLN
0.0000 mg | Freq: Two times a day (BID) | INTRAMUSCULAR | Status: AC
  Administered 2016-12-04: 4 mg via INTRAVENOUS
  Administered 2016-12-05: 1 mg via INTRAVENOUS
  Filled 2016-12-02: qty 1
  Filled 2016-12-02: qty 2

## 2016-12-02 MED ORDER — MAGNESIUM SULFATE 2 GM/50ML IV SOLN
2.0000 g | Freq: Once | INTRAVENOUS | Status: AC
  Administered 2016-12-02: 2 g via INTRAVENOUS
  Filled 2016-12-02: qty 50

## 2016-12-02 MED ORDER — POTASSIUM CHLORIDE 20 MEQ/15ML (10%) PO SOLN
40.0000 meq | Freq: Once | ORAL | Status: AC
  Administered 2016-12-02: 40 meq via ORAL
  Filled 2016-12-02: qty 30

## 2016-12-02 MED ORDER — LISINOPRIL 20 MG PO TABS
20.0000 mg | ORAL_TABLET | Freq: Every day | ORAL | Status: DC
  Administered 2016-12-03 – 2016-12-08 (×6): 20 mg via ORAL
  Filled 2016-12-02 (×6): qty 1

## 2016-12-02 MED ORDER — DILTIAZEM HCL ER COATED BEADS 240 MG PO CP24
240.0000 mg | ORAL_CAPSULE | Freq: Every day | ORAL | Status: DC
  Administered 2016-12-03 – 2016-12-08 (×6): 240 mg via ORAL
  Filled 2016-12-02 (×7): qty 1

## 2016-12-02 NOTE — ED Provider Notes (Signed)
[facility] DEPT Provider Note   CSN: [id number] Arrival date & time: 12/02/16  1607     History   Chief Complaint Chief Complaint  Patient presents with  . Seizures    no history     HPI Shawn Hendrix is a 66 y.o. male.  HPI 66 year old male who presents with seizures. He reports history of seizures related to alcohol withdrawal. History of HTN and alcohol abuse. States he started drinking water recently instead of alcohol and states he does not know why he stopped drinking alcohol. Normally drinks a bottle of wine a day, he states. He does not remember what happened, but just prior to arrival had 2 seizures back to back, described by EMS to be grand mal. He was lethargic and arousable only to pain prior to arrival. Endorses tremoring and n/v intermittently. Unsure when his last drink was, "maybe a week ago" "maybe 2-3 days ago". No hallucinations. No fever or chills, abdominal pain, cough, dyspnea, chest pain.  Past Medical History:  Diagnosis Date  . Anemia   . Arthralgia   . Arthritis   . Gout   . Hypertension   . Prostate cancer ([place])   . Seizure [facility])     Patient Active Problem List   Diagnosis Date Noted  . Malignant neoplasm of prostate ([place]) 10/31/2014    Past Surgical History:  Procedure Laterality Date  . EYE SURGERY     R eye removal  . PROSTATE BIOPSY         Home Medications    Prior to Admission medications   Medication Sig Start Date End Date Taking? Authorizing Provider  diltiazem (TIAZAC) 240 MG 24 hr capsule Take 240 mg by mouth daily.    Historical Provider, MD  ferrous sulfate 325 (65 FE) MG tablet Take 325 mg by mouth daily with breakfast.    Historical Provider, MD  lisinopril (PRINIVIL,ZESTRIL) 20 MG tablet Take 20 mg by mouth daily.    Historical Provider, MD  meloxicam (MOBIC) 7.5 MG tablet Take 7.5 mg by mouth daily. 08/03/14   Historical Provider, MD  naproxen (NAPROSYN) 500 MG tablet Take 500 mg by mouth 2 (two) times daily  with a meal.    Historical Provider, MD  VOLTAREN 1 % GEL  08/03/14   Historical Provider, MD    Family History Family History  Problem Relation Age of Onset  . Cancer Sister   . Cancer Sister     Social History Social History  Substance Use Topics  . Smoking status: Never Smoker  . Smokeless tobacco: Never Used  . Alcohol use Yes     Comment: pint of alcohol daily     Allergies   Patient has no known allergies.   Review of Systems Review of Systems  Constitutional: Negative for fever.  HENT: Positive for congestion.   Respiratory: Negative for shortness of breath.   Cardiovascular: Negative for chest pain.  Gastrointestinal: Positive for nausea and vomiting. Negative for abdominal pain.  Genitourinary: Negative for difficulty urinating.  Neurological: Positive for seizures.  Hematological: Does not bruise/bleed easily.  Psychiatric/Behavioral: Negative for confusion.  All other systems reviewed and are negative.    Physical Exam Updated Vital Signs BP (!) 160/110 Comment: Simultaneous filing. User may not have seen previous data.  Pulse (!) 104 Comment: Simultaneous filing. User may not have seen previous data.  Temp 98.6 F (37 C) (Oral)   Resp 19 Comment: Simultaneous filing. User may not have seen previous data.  SpO2 99% Comment: Simultaneous filing. User may not have seen previous data.  Physical Exam Physical Exam  Nursing note and vitals reviewed. Constitutional: No nourished appearing, cachectic, disheveled, non-toxic, and in no acute distress Head: Normocephalic and atraumatic.  Mouth/Throat: Oropharynx is clear and dry mucous membranes, poor dentition.  Neck: Normal range of motion. Neck supple.  Cardiovascular: Tachycardic rate and regular rhythm.   no lower extremity edema Pulmonary/Chest: Effort normal and breath sounds normal.  Abdominal: Soft. There is no tenderness. There is no rebound and no guarding.  Musculoskeletal: Normal range of  motion.  Neurological: Alert, no facial droop, fluent speech, moves all extremities symmetrically to command, sensation to light touch intact throughout Skin: Skin is warm and dry.  Psychiatric: Cooperative   ED Treatments / Results  Labs (all labs ordered are listed, but only abnormal results are displayed) Labs Reviewed  CBC - Abnormal; Notable for the following:       Result Value   RBC 1.99 (*)    Hemoglobin 7.6 (*)    HCT 22.9 (*)    MCV 115.1 (*)    MCH 38.2 (*)    RDW 16.1 (*)    Platelets 56 (*)    All other components within normal limits  DIFFERENTIAL - Abnormal; Notable for the following:    Lymphs Abs 0.4 (*)    All other components within normal limits  BASIC METABOLIC PANEL - Abnormal; Notable for the following:    Potassium 3.0 (*)    Chloride 97 (*)    BUN <5 (*)    Calcium 8.5 (*)    All other components within normal limits  MAGNESIUM - Abnormal; Notable for the following:    Magnesium 1.3 (*)    All other components within normal limits  CBG MONITORING, ED - Abnormal; Notable for the following:    Glucose-Capillary 118 (*)    All other components within normal limits  ETHANOL  HEPATIC FUNCTION PANEL  RAPID URINE DRUG SCREEN, HOSP PERFORMED  POC OCCULT BLOOD, ED    EKG  EKG Interpretation  Date/Time:  Monday December 02 2016 16:47:43 EDT Ventricular Rate:  87 PR Interval:    QRS Duration: 87 QT Interval:  422 QTC Calculation: 508 R Axis:   58 Text Interpretation:  Sinus rhythm Prolonged QT interval no acute changes other than prolonged QT Confirmed by Candido Flott MD, Lindee Leason [phone]) on 12/02/2016 5:51:05 PM       Radiology Ct Head Wo Contrast  Result Date: 12/02/2016 CLINICAL DATA:  Acute onset of seizures.  Initial encounter. EXAM: CT HEAD WITHOUT CONTRAST TECHNIQUE: Contiguous axial images were obtained from the base of the skull through the vertex without intravenous contrast. COMPARISON:  None. FINDINGS: Brain: No evidence of acute infarction,  hemorrhage, hydrocephalus, extra-axial collection or mass lesion/mass effect. Prominence of the ventricles and sulci reflects mild to moderate cortical volume loss. Cerebellar atrophy is noted. Scattered periventricular white matter change likely reflects small vessel ischemic microangiopathy. The brainstem and fourth ventricle are within normal limits. The basal ganglia are unremarkable in appearance. The cerebral hemispheres demonstrate grossly normal gray-white differentiation. No mass effect or midline shift is seen. Vascular: No hyperdense vessel or unexpected calcification. Skull: There is no evidence of fracture; visualized osseous structures are unremarkable in appearance. Sinuses/Orbits: The visualized portions of the orbits are grossly unremarkable. A right optic globe prosthesis is noted. The paranasal sinuses and mastoid air cells are well-aerated. Other: No significant soft tissue abnormalities are seen. IMPRESSION: 1. No acute intracranial  pathology seen on CT. 2. Mild to moderate cortical volume loss and scattered small vessel ischemic microangiopathy. Electronically Signed   By: Garald Balding M.D.   On: 12/02/2016 17:32    Procedures Procedures (including critical care time)  Medications Ordered in ED Medications  LORazepam (ATIVAN) injection 0-4 mg (not administered)    Followed by  LORazepam (ATIVAN) injection 0-4 mg (not administered)  magnesium sulfate IVPB 2 g 50 mL (not administered)  potassium chloride 30 mEq in sodium chloride 0.9 % 265 mL (KCL MULTIRUN) IVPB (not administered)  sodium chloride 0.9 % bolus 1,000 mL (1,000 mLs Intravenous New Bag/Given 12/02/16 1650)  sodium chloride 0.9 % 1,000 mL with thiamine 013 mg, folic acid 1 mg, multivitamins adult 10 mL infusion ( Intravenous New Bag/Given 12/02/16 1659)  LORazepam (ATIVAN) injection 2 mg (2 mg Intravenous Given 12/02/16 1651)     Initial Impression / Assessment and Plan / ED Course  I have reviewed the triage vital  signs and the nursing notes.  Pertinent labs & imaging results that were available during my care of the patient were reviewed by me and considered in my medical decision making (see chart for details).    Presenting with 2 grand mal seizures, and unclear who this was witnessed by. Patient does not remember this, I suspect may be alcohol withdrawal seizures as the etiology. I he has been endorsing some nausea and vomiting and tremors recently, but is a poor historian and unclear of when he stopped drinking. Mildly tachycardic here. No focal neurological deficits. With hypomagnesemia and hypokalemia, and is repleted by IV. Also with new anemia of 7.6, but no evidence or history of GI bleed. Guaiac negative brown stool. Also thrombocytopenic. Question of this may be related to his alcohol use. CT head is visualized and does not show acute intracranial processes. He is presents febrile protocol. Did initially receive 2 mg of Ativan. No further seizures in the ED. Discussed with Dr. Eulas Post who will admit for ongoing treatment.  Dr. Eulas Post requested neurology consult. Spoke with Dr. Nicole Kindred. Recommending treating as alcohol withdrawal for now. Requested to be consulted if the patient has more seizures. He does not need to see currently.  Final Clinical Impressions(s) / ED Diagnoses   Final diagnoses:  Seizure [facility])    New Prescriptions New Prescriptions   No medications on file     Forde Dandy, MD 12/02/16 2113

## 2016-12-02 NOTE — ED Triage Notes (Signed)
Patient here from home with new onset seizures.  Had 2 grand mal seizures last 3 min a piece.  Had a 1 minute rest between.  Was arousalble to pain only for EMS.  No lethargic and can say complete sentences.  Patient states has no history of seizures.  Does drink ETOH daily and is having having tremors.  CBG is 118

## 2016-12-02 NOTE — H&P (Signed)
History and Physical    Shawn Hendrix XTG:[id number] DOB: Oct 09, 1950 DOA: 12/02/2016  PCP: No PCP Per Patient   Patient coming from: Home  Chief Complaint: Brought in by EMS for seizure  HPI: Shawn Hendrix is a 66 y.o. gentleman with a history of HTN, gout, arthritis, prostate cancer, anemia, and daily EtOH use who presents to the ED via EMS after having multiple seizures at home.  He is a poor historian, but he thinks that his neighbor found him down and called 911.  Per the ED attending, EMS personnel witnessed at least one seizure, described as "grand mal", in the field.  The patient reports that he does not know when he had his last drink, but he suspects that it has been several days (at least).  No prior history of seizure.  He has had tremors in the past 1-2 days, but he denies hallucinations.  He had nausea and vomiting this morning.  No chest pain.  No abdominal pain.  No dysuria.  No diarrhea.  ED Course: The patient received 2mg  of IV ativan in the ED.  Hgb 7.6 (last level normal in 2015).  Platelet count 56 (normal in 2015).  Potassium 3.  Seizures attributed to EtOH withdrawal at this point.  Hospitalist asked to admit.  Review of Systems: As per HPI otherwise 10 systems reviewed and negative.   Past Medical History:  Diagnosis Date  . Anemia   . Arthralgia   . Arthritis   . Gout   . Hypertension   . Prostate cancer ([place])   . Seizure [facility])     Past Surgical History:  Procedure Laterality Date  . EYE SURGERY     R eye removal  . PROSTATE BIOPSY       reports that he has never smoked. He has never used smokeless tobacco. He reports that he drinks alcohol. He reports that he does not use drugs. Daily EtOH use.  No Known Allergies  Family History  Problem Relation Age of Onset  . Cancer Sister   . Cancer Sister      Prior to Admission medications   Medication Sig Start Date End Date Taking? Authorizing Provider  diltiazem (TIAZAC) 240 MG 24 hr  capsule Take 240 mg by mouth daily.    Historical Provider, MD  ferrous sulfate 325 (65 FE) MG tablet Take 325 mg by mouth daily with breakfast.    Historical Provider, MD  lisinopril (PRINIVIL,ZESTRIL) 20 MG tablet Take 20 mg by mouth daily.    Historical Provider, MD  meloxicam (MOBIC) 7.5 MG tablet Take 7.5 mg by mouth daily. 08/03/14   Historical Provider, MD  naproxen (NAPROSYN) 500 MG tablet Take 500 mg by mouth 2 (two) times daily with a meal.    Historical Provider, MD  VOLTAREN 1 % GEL  08/03/14   Historical Provider, MD    Physical Exam: Vitals:   12/02/16 1630 12/02/16 1645 12/02/16 1715 12/02/16 2250  BP: (!) 165/103 (!) 165/103 (!) 160/110 (!) 155/95  Pulse: 97 97 (!) 104 86  Resp: (!) 22  19 (!) 27  Temp:    99.3 F (37.4 C)  TempSrc:    Oral  SpO2: (!) 87%  99% 99%  Weight:    50 kg (110 lb 3.2 oz)  Height:    5\' 6"  (1.676 m)      Constitutional: NAD, calm, comfortable Vitals:   12/02/16 1630 12/02/16 1645 12/02/16 1715 12/02/16 2250  BP: (!) 165/103 (!) 165/103 Marland Kitchen)  160/110 (!) 155/95  Pulse: 97 97 (!) 104 86  Resp: (!) 22  19 (!) 27  Temp:    99.3 F (37.4 C)  TempSrc:    Oral  SpO2: (!) 87%  99% 99%  Weight:    50 kg (110 lb 3.2 oz)  Height:    5\' 6"  (1.676 m)   Eyes: traumatic injury to right orbit, pupil reactive on the left, conjunctiva clear on the left ENMT: Mucous membranes are moist. Posterior pharynx not completely visualized. Poor dentition, missing several teeth Neck: normal appearance, supple, no masses Respiratory: clear to auscultation bilaterally, no wheezing, no crackles. Normal respiratory effort. No accessory muscle use.  Cardiovascular: Normal rate, regular rhythm, no murmurs / rubs / gallops. No extremity edema. 2+ pedal pulses. GI: abdomen is soft and compressible.  No distention.  No tenderness.  Bowel sounds are present. Musculoskeletal:  No joint deformity in upper and lower extremities. Good ROM, no contractures. Normal muscle tone.    Skin: no rashes, warm and dry Neurologic: CN 2-12 grossly intact (except missing right eye). Sensation intact, Strength symmetric bilaterally.  Mild tremor noted.  No pronator drift. Psychiatric: Flat affect.  Oriented to person, place, [place] president but not date/time.  Judgment impaired.    Labs on Admission: I have personally reviewed following labs and imaging studies  CBC:  Recent Labs Lab 12/02/16 1630  WBC 5.2  NEUTROABS 4.2  HGB 7.6*  HCT 22.9*  MCV 115.1*  PLT 56*   Basic Metabolic Panel:  Recent Labs Lab 12/02/16 1900  NA 136  K 3.0*  CL 97*  CO2 24  GLUCOSE 86  BUN <5*  CREATININE 0.97  CALCIUM 8.5*  MG 1.3*   GFR: Estimated Creatinine Clearance: 53 mL/min (by C-G formula based on SCr of 0.97 mg/dL). Liver Function Tests:  Recent Labs Lab 12/02/16 2250  AST 64*  ALT 22  ALKPHOS 68  BILITOT 3.8*  PROT 6.7  ALBUMIN 3.4*   CBG:  Recent Labs Lab 12/02/16 1627  GLUCAP 118*   Urine analysis:    Component Value Date/Time   COLORURINE YELLOW 12/02/2016 2057   APPEARANCEUR CLEAR 12/02/2016 2057   LABSPEC 1.010 12/02/2016 2057   PHURINE 6.0 12/02/2016 2057   GLUCOSEU NEGATIVE 12/02/2016 2057   HGBUR MODERATE (A) 12/02/2016 2057   BILIRUBINUR NEGATIVE 12/02/2016 2057   KETONESUR 5 (A) 12/02/2016 2057   PROTEINUR 100 (A) 12/02/2016 2057   UROBILINOGEN 1.0 04/15/2014 1013   NITRITE NEGATIVE 12/02/2016 2057   LEUKOCYTESUR NEGATIVE 12/02/2016 2057    Radiological Exams on Admission: Ct Head Wo Contrast  Result Date: 12/02/2016 CLINICAL DATA:  Acute onset of seizures.  Initial encounter. EXAM: CT HEAD WITHOUT CONTRAST TECHNIQUE: Contiguous axial images were obtained from the base of the skull through the vertex without intravenous contrast. COMPARISON:  None. FINDINGS: Brain: No evidence of acute infarction, hemorrhage, hydrocephalus, extra-axial collection or mass lesion/mass effect. Prominence of the ventricles and sulci reflects mild to  moderate cortical volume loss. Cerebellar atrophy is noted. Scattered periventricular white matter change likely reflects small vessel ischemic microangiopathy. The brainstem and fourth ventricle are within normal limits. The basal ganglia are unremarkable in appearance. The cerebral hemispheres demonstrate grossly normal gray-white differentiation. No mass effect or midline shift is seen. Vascular: No hyperdense vessel or unexpected calcification. Skull: There is no evidence of fracture; visualized osseous structures are unremarkable in appearance. Sinuses/Orbits: The visualized portions of the orbits are grossly unremarkable. A right optic globe prosthesis is noted. The  paranasal sinuses and mastoid air cells are well-aerated. Other: No significant soft tissue abnormalities are seen. IMPRESSION: 1. No acute intracranial pathology seen on CT. 2. Mild to moderate cortical volume loss and scattered small vessel ischemic microangiopathy. Electronically Signed   By: Garald Balding M.D.   On: 12/02/2016 17:32    EKG: Independently reviewed. NSR.  No acute ST segment changes.  Prolonged QT interval.  Assessment/Plan Principal Problem:   Alcohol withdrawal seizure, uncomplicated (HCC) Active Problems:   Anemia   Thrombocytopenia (HCC)   Hypokalemia      Alcohol withdrawal seizure --Place in stepdown unit --CIWA protocol --Seizure precautions --Aspiration precautions --Will need formal neurology consult if he has another seizure --Additional neuro imaging deferred for now  Hypomagnesemia --IV replacement ordered  Hypokalemia --Replacement ordered  HTN --Lisinopril, cardizem  Prolonged QT interval on EKG, likely related to electrolyte abnormalities --Telemetry monitoring --Replace electrolytes as needed  Anemia, likely chronic --Stool heme occult --Monitor for signs of active bleeding --Anemia panel --Oral PPI for now --HOLD NSAIDs  Thrombocytopenia --Suspect underlying liver  disease related to EtOH abuse --Avoid anticoagulants     DVT prophylaxis: SCDs Code Status: FULL Family Communication: Patient alone in the ED at time of admission.  Disposition Plan: To be determined. Consults called: Case discussed with neurology per ED attending.  We will need to call for formal consultation if the patient has another seizure. Admission status: Place in observation, stepdown unit.   TIME SPENT: 60 minutes   Eber Jones MD [facility] Pager [phone]  If 7PM-7AM, please contact night-coverage [email or web address] Password [facility]  12/02/2016, 10:54 PM

## 2016-12-02 NOTE — ED Notes (Signed)
Patient transported to CT 

## 2016-12-03 ENCOUNTER — Encounter (HOSPITAL_COMMUNITY): Payer: Self-pay

## 2016-12-03 ENCOUNTER — Observation Stay (HOSPITAL_COMMUNITY): Payer: Medicare Other

## 2016-12-03 DIAGNOSIS — E440 Moderate protein-calorie malnutrition: Secondary | ICD-10-CM | POA: Diagnosis present

## 2016-12-03 DIAGNOSIS — Z8546 Personal history of malignant neoplasm of prostate: Secondary | ICD-10-CM | POA: Diagnosis not present

## 2016-12-03 DIAGNOSIS — R2681 Unsteadiness on feet: Secondary | ICD-10-CM | POA: Diagnosis present

## 2016-12-03 DIAGNOSIS — F10230 Alcohol dependence with withdrawal, uncomplicated: Secondary | ICD-10-CM | POA: Diagnosis not present

## 2016-12-03 DIAGNOSIS — F10239 Alcohol dependence with withdrawal, unspecified: Secondary | ICD-10-CM | POA: Diagnosis present

## 2016-12-03 DIAGNOSIS — R41841 Cognitive communication deficit: Secondary | ICD-10-CM | POA: Diagnosis present

## 2016-12-03 DIAGNOSIS — I10 Essential (primary) hypertension: Secondary | ICD-10-CM | POA: Diagnosis present

## 2016-12-03 DIAGNOSIS — R569 Unspecified convulsions: Secondary | ICD-10-CM | POA: Diagnosis present

## 2016-12-03 DIAGNOSIS — M109 Gout, unspecified: Secondary | ICD-10-CM | POA: Diagnosis present

## 2016-12-03 DIAGNOSIS — D649 Anemia, unspecified: Secondary | ICD-10-CM | POA: Diagnosis present

## 2016-12-03 DIAGNOSIS — E876 Hypokalemia: Secondary | ICD-10-CM | POA: Diagnosis present

## 2016-12-03 DIAGNOSIS — Z9001 Acquired absence of eye: Secondary | ICD-10-CM | POA: Diagnosis not present

## 2016-12-03 DIAGNOSIS — Z681 Body mass index (BMI) 19 or less, adult: Secondary | ICD-10-CM | POA: Diagnosis not present

## 2016-12-03 DIAGNOSIS — M6281 Muscle weakness (generalized): Secondary | ICD-10-CM | POA: Diagnosis present

## 2016-12-03 DIAGNOSIS — M1990 Unspecified osteoarthritis, unspecified site: Secondary | ICD-10-CM | POA: Diagnosis present

## 2016-12-03 DIAGNOSIS — Z79899 Other long term (current) drug therapy: Secondary | ICD-10-CM | POA: Diagnosis not present

## 2016-12-03 DIAGNOSIS — R2689 Other abnormalities of gait and mobility: Secondary | ICD-10-CM | POA: Diagnosis present

## 2016-12-03 DIAGNOSIS — K709 Alcoholic liver disease, unspecified: Secondary | ICD-10-CM | POA: Diagnosis present

## 2016-12-03 DIAGNOSIS — D696 Thrombocytopenia, unspecified: Secondary | ICD-10-CM | POA: Diagnosis present

## 2016-12-03 LAB — CBC
HCT: 34.6 % — ABNORMAL LOW (ref 39.0–52.0)
HEMOGLOBIN: 12 g/dL — AB (ref 13.0–17.0)
MCH: 38.7 pg — ABNORMAL HIGH (ref 26.0–34.0)
MCHC: 34.7 g/dL (ref 30.0–36.0)
MCV: 111.6 fL — ABNORMAL HIGH (ref 78.0–100.0)
Platelets: 89 10*3/uL — ABNORMAL LOW (ref 150–400)
RBC: 3.1 MIL/uL — AB (ref 4.22–5.81)
RDW: 16.3 % — ABNORMAL HIGH (ref 11.5–15.5)
WBC: 10.5 10*3/uL (ref 4.0–10.5)

## 2016-12-03 LAB — IRON AND TIBC
Iron: 68 ug/dL (ref 45–182)
Saturation Ratios: 26 % (ref 17.9–39.5)
TIBC: 266 ug/dL (ref 250–450)
UIBC: 198 ug/dL

## 2016-12-03 LAB — BASIC METABOLIC PANEL
Anion gap: 9 (ref 5–15)
BUN: <5 mg/dL — ABNORMAL LOW (ref 6–20)
CHLORIDE: 101 mmol/L (ref 101–111)
CO2: 27 mmol/L (ref 22–32)
Calcium: 8 mg/dL — ABNORMAL LOW (ref 8.9–10.3)
Creatinine, Ser: 0.8 mg/dL (ref 0.61–1.24)
Glucose, Bld: 85 mg/dL (ref 65–99)
Potassium: 3.9 mmol/L (ref 3.5–5.1)
SODIUM: 137 mmol/L (ref 135–145)

## 2016-12-03 LAB — RETICULOCYTES
RBC.: 3.04 MIL/uL — ABNORMAL LOW (ref 4.22–5.81)
RETIC COUNT ABSOLUTE: 73 10*3/uL (ref 19.0–186.0)
Retic Ct Pct: 2.4 % (ref 0.4–3.1)

## 2016-12-03 LAB — FERRITIN: FERRITIN: 1003 ng/mL — AB (ref 24–336)

## 2016-12-03 LAB — FOLATE: Folate: 41.1 ng/mL (ref >5.9)

## 2016-12-03 LAB — VITAMIN B12: VITAMIN B 12: 435 pg/mL (ref 180–914)

## 2016-12-03 LAB — TSH: TSH: 0.725 u[IU]/mL (ref 0.350–4.500)

## 2016-12-03 LAB — MRSA PCR SCREENING: MRSA BY PCR: NEGATIVE

## 2016-12-03 MED ORDER — PROSIGHT PO TABS
1.0000 | ORAL_TABLET | Freq: Every day | ORAL | Status: DC
  Administered 2016-12-03 – 2016-12-08 (×6): 1 via ORAL
  Filled 2016-12-03 (×6): qty 1

## 2016-12-03 MED ORDER — FOLIC ACID 1 MG PO TABS
1.0000 mg | ORAL_TABLET | Freq: Every day | ORAL | Status: DC
  Administered 2016-12-03 – 2016-12-08 (×6): 1 mg via ORAL
  Filled 2016-12-03 (×6): qty 1

## 2016-12-03 MED ORDER — VITAMIN B-1 100 MG PO TABS
100.0000 mg | ORAL_TABLET | Freq: Every day | ORAL | Status: DC
  Administered 2016-12-03 – 2016-12-08 (×6): 100 mg via ORAL
  Filled 2016-12-03 (×6): qty 1

## 2016-12-03 NOTE — Progress Notes (Signed)
Patient has visitor, Lorrine Kin, that states he should be patients HPOA. The visitor states that he brought all of patients health insurance information to the unit for him.  There is no information in system regarding this. The patient is off the unit; RN escorted visitor to waiting room until patients return.

## 2016-12-03 NOTE — Care Management Important Message (Signed)
Important Message  Patient Details  Name: Shawn Hendrix MRN: [id number] Date of Birth: 22-Nov-1950   Medicare Important Message Given:  Yes    Zenon Mayo, RN 12/03/2016, 6:56 PMImportant Message  Patient Details  Name: Shawn Hendrix MRN: [id number] Date of Birth: Jun 05, 1951   Medicare Important Message Given:  Yes    Zenon Mayo, RN 12/03/2016, 6:56 PM

## 2016-12-03 NOTE — Care Management Note (Signed)
Case Management Note  Patient Details  Name: Shawn Hendrix MRN: [id number] Date of Birth: 10/22/1950  Subjective/Objective:  From home presents with seizures due to ETOH withdrawal, on ciwa protocol, NCM will follow for dc needs.                   Action/Plan:   Expected Discharge Date:                  Expected Discharge Plan:  Home/Self Care  In-House Referral:     Discharge planning Services  CM Consult  Post Acute Care Choice:    Choice offered to:     DME Arranged:    DME Agency:     HH Arranged:    HH Agency:     Status of Service:  In process, will continue to follow  If discussed at Long Length of Stay Meetings, dates discussed:    Additional Comments:  Zenon Mayo, RN 12/03/2016, 6:47 PM

## 2016-12-03 NOTE — Progress Notes (Signed)
EEG Completed; Results Pending  

## 2016-12-03 NOTE — Procedures (Signed)
ELECTROENCEPHALOGRAM REPORT  Date of Study: 12/03/2016  Patient's Name: Shawn Hendrix MRN: [id number] Date of Birth: [birth year]  Referring Provider: Aline August, MD  Clinical History: 66 year old man with new-onset seizures.  Medications: acetaminophen (TYLENOL)  diltiazem (CARDIZEM CD) 24 hr capsule 240 mg  ferrous sulfate tablet 562 mg  folic acid (FOLVITE) tablet 1 mg  lisinopril (PRINIVIL,ZESTRIL) tablet 20 mg  LORazepam (ATIVAN)  multivitamin (PROSIGHT) tablet 1 tablet  ondansetron (ZOFRAN) tablet 4 mg  pantoprazole (PROTONIX) EC tablet 40 mg  thiamine (VITAMIN B-1) tablet 100 mg   Technical Summary: A multichannel digital EEG recording measured by the international 10-20 system with electrodes applied with paste and impedances below 5000 ohms performed as portable with EKG monitoring in an awake patient.  Hyperventilation and photic stimulation were not performed.  The digital EEG was referentially recorded, reformatted, and digitally filtered in a variety of bipolar and referential montages for optimal display.   Description: The patient is awake during the recording.  During maximal wakefulness, there is a symmetric, low voltage 8 Hz posterior dominant rhythm. There is diffuse excess beta activity and mild  2-3 Hz delta slowing of the waking background.  Stage 2 sleep was not seen. There were no epileptiform discharges or electrographic seizures seen.    EKG lead was unremarkable.  Impression: This awake EEG is abnormal due to excess beta activity and mild diffuse slowing of the waking background.  Clinical Correlation of the above findings indicates diffuse cerebral dysfunction that is non-specific in etiology and can be seen with hypoxic/ischemic injury, toxic/metabolic encephalopathies, neurodegenerative disorders, or medication effect.  Excess beta activity may be seen in setting of benzodiazepines.  The absence of epileptiform discharges does not rule out a  clinical diagnosis of epilepsy.  Clinical correlation is advised.   Metta Clines, DO

## 2016-12-03 NOTE — Consult Note (Signed)
Neurology Consult Note  Reason for Consultation: Seizures  Requesting provider: Aline August, MD  CC: "I had a seizure"  HPI: This is a 75-yo RH man who was admitted on 12/02/16 for evaluation of seizures. History is obtained from the patient who is a vague and limited historian. I have also reviewed his available medical records at length.   Per ED MD note, the patient was brought in by EMS for evaluation of seizures. He reportedly had two seizures in close proximity yesterday. These were witnessed by EMS who described GTC seizure activity. He was then postictal and before arrival to the ED was lethargic and responsive only to pain. His mentation gradually improved and he was able to tell the ED MD that he has a history of seizures due to EtOH withdrawal in the past. He told her that he recently cut back on his drinking. He had no recollection of yesterday's events. He was noted to have a nonfocal neurologic exam and it was felt that his seizures were likely due to EtOH withdrawal. He was admitted for further evaluation. Neurology is now consulted by the attending hospitalist for further recommendations.   The patient confirms to me that he cut back on his drinking several days ago. He reports that he had been drinking two pints per day "for forever" but for the past few days he has been drinking less than half a pint daily. He is unable to tell me exactly when he cut back. His last drink was the day prior to admission. He states that he had seizures in the past but cannot tell me when or what they were like, only that he was told that they were "because of drinking."   PMH:  Past Medical History:  Diagnosis Date  . Anemia   . Arthralgia   . Arthritis   . Gout   . Hypertension   . Prostate cancer ([place])   . Seizure ([place])     PSH:  Past Surgical History:  Procedure Laterality Date  . EYE SURGERY     R eye removal  . PROSTATE BIOPSY      Family history: Family History  Problem  Relation Age of Onset  . Cancer Sister   . Cancer Sister     Social history:  Social History   Social History  . Marital status: Legally Separated    Spouse name: N/A  . Number of children: N/A  . Years of education: N/A   Occupational History  . Not on file.   Social History Main Topics  . Smoking status: Never Smoker  . Smokeless tobacco: Never Used  . Alcohol use Yes     Comment: pint of alcohol daily  . Drug use: No  . Sexual activity: Not Currently   Other Topics Concern  . Not on file   Social History Narrative  . No narrative on file    Current outpatient meds: Medications reviewed and reconciled.  Current Meds  Medication Sig  . Aspirin-Salicylamide-Caffeine (BC HEADACHE POWDER PO) Take 1 packet by mouth as needed (for headache).    Current inpatient meds: Medications reviewed and reconciled.  Current Facility-Administered Medications  Medication Dose Route Frequency Provider Last Rate Last Dose  . acetaminophen (TYLENOL) tablet 650 mg  650 mg Oral Q6H PRN Lily Kocher, MD       Or  . acetaminophen (TYLENOL) suppository 650 mg  650 mg Rectal Q6H PRN Lily Kocher, MD      . diltiazem (CARDIZEM CD)  24 hr capsule 240 mg  240 mg Oral Daily Lily Kocher, MD   240 mg at 12/03/16 1116  . ferrous sulfate tablet 325 mg  325 mg Oral Q breakfast Lily Kocher, MD   325 mg at 12/03/16 1116  . folic acid (FOLVITE) tablet 1 mg  1 mg Oral Daily Kshitiz Alekh, MD   1 mg at 12/03/16 1116  . lisinopril (PRINIVIL,ZESTRIL) tablet 20 mg  20 mg Oral Daily Lily Kocher, MD   20 mg at 12/03/16 1117  . LORazepam (ATIVAN) injection 0-4 mg  0-4 mg Intravenous Q6H Forde Dandy, MD       Followed by  . [START ON 12/04/2016] LORazepam (ATIVAN) injection 0-4 mg  0-4 mg Intravenous Q12H Forde Dandy, MD      . multivitamin (PROSIGHT) tablet 1 tablet  1 tablet Oral Daily Kshitiz Alekh, MD      . ondansetron (ZOFRAN) tablet 4 mg  4 mg Oral Q6H PRN Lily Kocher, MD       Or  . ondansetron  [facility]) injection 4 mg  4 mg Intravenous Q6H PRN Lily Kocher, MD      . pantoprazole (PROTONIX) EC tablet 40 mg  40 mg Oral Daily Lily Kocher, MD   40 mg at 12/03/16 1116  . sodium chloride flush (NS) 0.9 % injection 3 mL  3 mL Intravenous Q12H Lily Kocher, MD   3 mL at 12/03/16 1117  . thiamine (VITAMIN B-1) tablet 100 mg  100 mg Oral Daily Kshitiz Alekh, MD   100 mg at 12/03/16 1115    Allergies: No Known Allergies  ROS: As per HPI. A full 14-point review of systems was performed and is otherwise unremarkable.   PE:  BP (!) 126/96   Pulse 90   Temp 99 F (37.2 C) (Oral)   Resp 18   Ht 5\' 6"  (1.676 m)   Wt 50 kg (110 lb 3.2 oz)   SpO2 100%   BMI 17.79 kg/m   General: WDWN, no acute distress. AAO to self, place, and day of week. Speech clear, no dysarthria. No aphasia. Follows commands briskly. Affect is restricted. Comportment is normal.  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 Neck supple without LAD. MMM, OP clear. Dentition poor. Sclerae anicteric. No conjunctival injection.  CV: Regular, 2/6 murmur that is loudest at the apex. Carotid pulses full and symmetric, no bruits. Distal pulses 2+ and symmetric.  Lungs: CTAB on anterior exam.  Abdomen: Soft, non-distended, non-tender. Bowel sounds present x4.  Extremities: No C/C/E. Neuro:  CN: The left pupil is round and reactive from 3-->2 mm. The R eye is not examined 2/2 remote trauma. VFFTC in the left eye, no vision in the R. EOMI without nystagmus. Facial sensation is intact to light touch. Face is symmetric at rest with normal strength and mobility. Hearing is intact to conversational voice. Palate elevates symmetrically and uvula is midline. Voice is normal in tone, pitch and quality. Bilateral SCM and trapezii are 5/5. Tongue is midline with normal bulk and mobility.  Motor: Normal bulk, tone, and strength. He has a mild intention tremor in both arms.  No drift.  Sensation: Intact to light touch.  DTRs: 2+, symmetric. Toes downgoing bilaterally.  Coordination: Finger-to-nose and heel-to-shin are without dysmetria. Intention tremor noted BUE. Finger taps are normal in amplitude and speed, no decrement.    Labs:  Lab Results  Component Value  Date   WBC 10.5 12/03/2016   HGB 12.0 (L) 12/03/2016   HCT 34.6 (L) 12/03/2016   PLT 89 (L) 12/03/2016   GLUCOSE 85 12/03/2016   ALT 22 12/02/2016   AST 64 (H) 12/02/2016   NA 137 12/03/2016   K 3.9 12/03/2016   CL 101 12/03/2016   CREATININE 0.80 12/03/2016   BUN <5 (L) 12/03/2016   CO2 27 12/03/2016   TSH 0.725 12/03/2016   Folate 41.1 Vitamin B12 435  Imaging:  I have personally and independently reviewed the [facility] without contrast from 12/02/16. This shows moderate to severe diffuse atrophy of the cerebrum and cerebellum. There is associated hydrocephalus ex-vacuo. He has moderate chronic small vessel ischemic disease. No acute abnormality.   Other diagnostic studies: EEG prelim review showed low voltage with diffuse beta, no epileptiform discharges or seizures. Formal read is pending.   Assessment and Plan:  1. Seizure: His presentation is most suggestive of EtOH withdrawal seizures given his report of recent drastic reduction in daily EtOH intake. EEG unrevealing. No need for AEDs in this setting. Continue to watch for s/s of EtOH withdrawal and treat accordingly. Seizure precautions.   2. Alcohol abuse: This is chronic and severe. Discussed the importance of longterm abstinence to avoid more seizure and to mitigate risk of cerebrovascular disease, dementia, and other medical problems. Monitor closely for s/s of withdrawal and treat with benzos as needed.   No additional recs at this time, will sign off. Call if any new issues arise.

## 2016-12-03 NOTE — Progress Notes (Signed)
Patient ID: Shawn Hendrix, male   DOB: 05-Mar-1951, 66 y.o.   MRN: [id number]  PROGRESS NOTE    [name]  FYB:[id number] DOB: 08-27-1950 DOA: 12/02/2016 PCP: No PCP Per Patient   Brief Narrative:  66 y.o. gentleman with a history of HTN, gout, arthritis, prostate cancer, anemia, and daily EtOH use was brought in by EMS for multiple seizures at home. Initial CT head was negative. Patient is still confused to time.   Assessment & Plan:   Principal Problem:   Alcohol withdrawal seizure, uncomplicated (HCC) Active Problems:   Anemia   Thrombocytopenia (HCC)   Hypokalemia   1. Seizures: probably due to Alcohol withdrawal seizure -- monitor mental status; EEG; Paged Neurology for consult --CIWA protocol --Seizure precautions; fall precautions --Aspiration precautions --Additional neuro imaging deferred for now -- Ativan iv prn seizures for now -- advance diet as tolerated --PT eval  2. Hypomagnesemia --repeat labs in am  3. Hypokalemia --improved  4. HTN --Lisinopril, cardizem. Monitor BP  5. Prolonged QT interval on EKG, likely related to electrolyte abnormalities --Telemetry monitoring --Replace electrolytes as needed  6. Anemia, likely chronic: - unclear if initial Hb was an error; Repeat Hb is 12; Repeat labs in AM --Monitor for signs of active bleeding --Anemia panel --Oral PPI for now --HOLD NSAIDs  7. Thrombocytopenia:  --Suspect underlying liver disease related to EtOH abuse --Avoid anticoagulants   DVT prophylaxis: SCDs Code Status: FULL Family Communication: none at bedside Disposition Plan: Might need home care in 1-2 days Consults called:Paged neurology for consult  Subjective: Patient seen and examined at bedside. He is a poor historian and slightly confused. No overnight fever; no seizures since admission as per patient's nurse.  Objective: Vitals:   12/02/16 1715 12/02/16 2250 12/03/16 0319 12/03/16 0700  BP: (!) 160/110  (!) 155/95 121/86 127/86  Pulse: (!) 104 86 80 87  Resp: 19 (!) 27 14 15   Temp:  99.3 F (37.4 C) 99.7 F (37.6 C) 99.7 F (37.6 C)  TempSrc:  Oral Oral Oral  SpO2: 99% 99% 100% 97%  Weight:  50 kg (110 lb 3.2 oz)    Height:  5\' 6"  (1.676 m)      Intake/Output Summary (Last 24 hours) at 12/03/16 0834 Last data filed at 12/03/16 0600  Gross per 24 hour  Intake           996.33 ml  Output              150 ml  Net           846.33 ml   Filed Weights   12/02/16 2250  Weight: 50 kg (110 lb 3.2 oz)    Examination:  General exam: Appears calm and comfortable  Respiratory system: Clear to auscultation. Respiratory effort normal. Cardiovascular system: S1 & S2 heard, RRR. No JVD, murmurs, rubs, gallops or clicks. No pedal edema. Gastrointestinal system: Abdomen is nondistended, soft and nontender. No organomegaly or masses felt. Normal bowel sounds heard. Central nervous system: Alert and awake. Confused to time. No focal neurological deficits. Moving extremities Extremities: no cyanosis, clubbing, edema    Data Reviewed: I have personally reviewed following labs and imaging studies  CBC:  Recent Labs Lab 12/02/16 1630 12/03/16 0230  WBC 5.2 10.5  NEUTROABS 4.2  --   HGB 7.6* 12.0*  HCT 22.9* 34.6*  MCV 115.1* 111.6*  PLT 56* 89*   Basic Metabolic Panel:  Recent Labs Lab 12/02/16 1900 12/03/16 0230  NA 136 137  K 3.0* 3.9  CL 97* 101  CO2 24 27  GLUCOSE 86 85  BUN <5* <5*  CREATININE 0.97 0.80  CALCIUM 8.5* 8.0*  MG 1.3*  --    GFR: Estimated Creatinine Clearance: 64.2 mL/min (by C-G formula based on SCr of 0.8 mg/dL). Liver Function Tests:  Recent Labs Lab 12/02/16 2250  AST 64*  ALT 22  ALKPHOS 68  BILITOT 3.8*  PROT 6.7  ALBUMIN 3.4*   No results for input(s): LIPASE, AMYLASE in the last 168 hours. No results for input(s): AMMONIA in the last 168 hours. Coagulation Profile: No results for input(s): INR, PROTIME in the last 168  hours. Cardiac Enzymes: No results for input(s): CKTOTAL, CKMB, CKMBINDEX, TROPONINI in the last 168 hours. BNP (last 3 results) No results for input(s): PROBNP in the last 8760 hours. HbA1C: No results for input(s): HGBA1C in the last 72 hours. CBG:  Recent Labs Lab 12/02/16 1627  GLUCAP 118*   Lipid Profile: No results for input(s): CHOL, HDL, LDLCALC, TRIG, CHOLHDL, LDLDIRECT in the last 72 hours. Thyroid Function Tests:  Recent Labs  12/03/16 0230  TSH 0.725   Anemia Panel:  Recent Labs  12/03/16 0230  VITAMINB12 435  FOLATE 41.1  FERRITIN 1,003*  TIBC 266  IRON 68  RETICCTPCT 2.4   Sepsis Labs: No results for input(s): PROCALCITON, LATICACIDVEN in the last 168 hours.  Recent Results (from the past 240 hour(s))  MRSA PCR Screening     Status: None   Collection Time: 12/02/16 11:04 PM  Result Value Ref Range Status   MRSA by PCR NEGATIVE NEGATIVE Final    Comment:        The GeneXpert MRSA Assay (FDA approved for NASAL specimens only), is one component of a comprehensive MRSA colonization surveillance program. It is not intended to diagnose MRSA infection nor to guide or monitor treatment for MRSA infections.          Radiology Studies: Ct Head Wo Contrast  Result Date: 12/02/2016 CLINICAL DATA:  Acute onset of seizures.  Initial encounter. EXAM: CT HEAD WITHOUT CONTRAST TECHNIQUE: Contiguous axial images were obtained from the base of the skull through the vertex without intravenous contrast. COMPARISON:  None. FINDINGS: Brain: No evidence of acute infarction, hemorrhage, hydrocephalus, extra-axial collection or mass lesion/mass effect. Prominence of the ventricles and sulci reflects mild to moderate cortical volume loss. Cerebellar atrophy is noted. Scattered periventricular white matter change likely reflects small vessel ischemic microangiopathy. The brainstem and fourth ventricle are within normal limits. The basal ganglia are unremarkable in  appearance. The cerebral hemispheres demonstrate grossly normal gray-white differentiation. No mass effect or midline shift is seen. Vascular: No hyperdense vessel or unexpected calcification. Skull: There is no evidence of fracture; visualized osseous structures are unremarkable in appearance. Sinuses/Orbits: The visualized portions of the orbits are grossly unremarkable. A right optic globe prosthesis is noted. The paranasal sinuses and mastoid air cells are well-aerated. Other: No significant soft tissue abnormalities are seen. IMPRESSION: 1. No acute intracranial pathology seen on CT. 2. Mild to moderate cortical volume loss and scattered small vessel ischemic microangiopathy. Electronically Signed   By: Garald Balding M.D.   On: 12/02/2016 17:32        Scheduled Meds: . diltiazem  240 mg Oral Daily  . ferrous sulfate  325 mg Oral Q breakfast  . folic acid  1 mg Oral Daily  . lisinopril  20 mg Oral Daily  . LORazepam  0-4 mg Intravenous Q6H  Followed by  . [START ON 12/04/2016] LORazepam  0-4 mg Intravenous Q12H  . multivitamin  1 tablet Oral Daily  . pantoprazole  40 mg Oral Daily  . sodium chloride flush  3 mL Intravenous Q12H  . thiamine  100 mg Oral Daily   Continuous Infusions: . 0.9 % NaCl with KCl 40 mEq / L 100 mL/hr (12/02/16 2313)     LOS: 0 days        Aline August, MD [facility] Pager [phone]  If 7PM-7AM, please contact night-coverage [email or web address] Password [facility] 12/03/2016, 8:34 AM

## 2016-12-03 NOTE — Care Management Obs Status (Signed)
MEDICARE OBSERVATION STATUS NOTIFICATION   Patient Details  Name: Shawn Hendrix MRN: [id number] Date of Birth: [birth year]   Medicare Observation Status Notification Given:       Zenon Mayo, RN 12/03/2016, 6:57 PM

## 2016-12-04 DIAGNOSIS — E876 Hypokalemia: Secondary | ICD-10-CM | POA: Diagnosis not present

## 2016-12-04 DIAGNOSIS — R569 Unspecified convulsions: Secondary | ICD-10-CM | POA: Diagnosis not present

## 2016-12-04 DIAGNOSIS — F10239 Alcohol dependence with withdrawal, unspecified: Secondary | ICD-10-CM | POA: Diagnosis not present

## 2016-12-04 DIAGNOSIS — F10230 Alcohol dependence with withdrawal, uncomplicated: Secondary | ICD-10-CM | POA: Diagnosis not present

## 2016-12-04 DIAGNOSIS — D696 Thrombocytopenia, unspecified: Secondary | ICD-10-CM | POA: Diagnosis not present

## 2016-12-04 DIAGNOSIS — D649 Anemia, unspecified: Secondary | ICD-10-CM | POA: Diagnosis not present

## 2016-12-04 LAB — COMPREHENSIVE METABOLIC PANEL
ALK PHOS: 55 U/L (ref 38–126)
ALT: 19 U/L (ref 17–63)
ANION GAP: 8 (ref 5–15)
AST: 50 U/L — ABNORMAL HIGH (ref 15–41)
Albumin: 2.9 g/dL — ABNORMAL LOW (ref 3.5–5.0)
BUN: 5 mg/dL — ABNORMAL LOW (ref 6–20)
CALCIUM: 8.2 mg/dL — AB (ref 8.9–10.3)
CO2: 28 mmol/L (ref 22–32)
Chloride: 97 mmol/L — ABNORMAL LOW (ref 101–111)
Creatinine, Ser: 0.76 mg/dL (ref 0.61–1.24)
Glucose, Bld: 88 mg/dL (ref 65–99)
Potassium: 3.4 mmol/L — ABNORMAL LOW (ref 3.5–5.1)
SODIUM: 133 mmol/L — AB (ref 135–145)
Total Bilirubin: 3.7 mg/dL — ABNORMAL HIGH (ref 0.3–1.2)
Total Protein: 5.9 g/dL — ABNORMAL LOW (ref 6.5–8.1)

## 2016-12-04 LAB — CBC WITH DIFFERENTIAL/PLATELET
Basophils Absolute: 0 10*3/uL (ref 0.0–0.1)
Basophils Relative: 0 %
Eosinophils Absolute: 0.1 10*3/uL (ref 0.0–0.7)
Eosinophils Relative: 1 %
HCT: 32.8 % — ABNORMAL LOW (ref 39.0–52.0)
Hemoglobin: 11.2 g/dL — ABNORMAL LOW (ref 13.0–17.0)
Lymphocytes Relative: 12 %
Lymphs Abs: 0.8 10*3/uL (ref 0.7–4.0)
MCH: 37.8 pg — AB (ref 26.0–34.0)
MCHC: 34.1 g/dL (ref 30.0–36.0)
MCV: 110.8 fL — ABNORMAL HIGH (ref 78.0–100.0)
MONO ABS: 1.1 10*3/uL — AB (ref 0.1–1.0)
Monocytes Relative: 17 %
NEUTROS ABS: 4.5 10*3/uL (ref 1.7–7.7)
Neutrophils Relative %: 70 %
PLATELETS: 98 10*3/uL — AB (ref 150–400)
RBC: 2.96 MIL/uL — ABNORMAL LOW (ref 4.22–5.81)
RDW: 15.5 % (ref 11.5–15.5)
WBC: 6.5 10*3/uL (ref 4.0–10.5)

## 2016-12-04 LAB — MAGNESIUM: MAGNESIUM: 1.5 mg/dL — AB (ref 1.7–2.4)

## 2016-12-04 MED ORDER — LORAZEPAM 2 MG/ML IJ SOLN
1.0000 mg | INTRAMUSCULAR | Status: DC | PRN
  Administered 2016-12-04 – 2016-12-05 (×3): 1 mg via INTRAVENOUS
  Filled 2016-12-04 (×3): qty 1

## 2016-12-04 MED ORDER — MAGNESIUM SULFATE 2 GM/50ML IV SOLN
2.0000 g | Freq: Once | INTRAVENOUS | Status: AC
  Administered 2016-12-04: 2 g via INTRAVENOUS
  Filled 2016-12-04: qty 50

## 2016-12-04 MED ORDER — POTASSIUM CHLORIDE CRYS ER 20 MEQ PO TBCR
40.0000 meq | EXTENDED_RELEASE_TABLET | Freq: Once | ORAL | Status: AC
  Administered 2016-12-04: 40 meq via ORAL
  Filled 2016-12-04: qty 2

## 2016-12-04 MED ORDER — BOOST / RESOURCE BREEZE PO LIQD
1.0000 | Freq: Three times a day (TID) | ORAL | Status: DC
  Administered 2016-12-04 – 2016-12-08 (×13): 1 via ORAL

## 2016-12-04 NOTE — Discharge Instructions (Signed)
Seizure, Adult °When you have a seizure: °· Parts of your body may move. °· How aware or awake (conscious) you are may change. °· You may shake (convulse). ° °Some people have symptoms right before a seizure happens. These symptoms may include: °· Fear. °· Worry (anxiety). °· Feeling like you are going to throw up (nausea). °· Feeling like the room is spinning (vertigo). °· Feeling like you saw or heard something before (deja vu). °· Odd tastes or smells. °· Changes in vision, such as seeing flashing lights or spots. ° °Seizures usually last from 30 seconds to 2 minutes. Usually, they are not harmful unless they last a long time. °Follow these instructions at home: °Medicines °· Take over-the-counter and prescription medicines only as told by your doctor. °· Avoid anything that may keep your medicine from working, such as alcohol. °Activity °· Do not do any activities that would be dangerous if you had another seizure, like driving or swimming. Wait until your doctor approves. °· If you live in the [place] ask your local DMV (department of motor vehicles) when you can drive. °· Rest. °Teaching others °· Teach friends and family what to do when you have a seizure. They should: °? Lay you on the ground. °? Protect your head and body. °? Loosen any tight clothing around your neck. °? Turn you on your side. °? Stay with you until you are better. °? Not hold you down. °? Not put anything in your mouth. °? Know whether or not you need emergency care. °General instructions °· Contact your doctor each time you have a seizure. °· Avoid anything that gives you seizures. °· Keep a seizure diary. Write down: °? What you think caused each seizure. °? What you remember about each seizure. °· Keep all follow-up visits as told by your doctor. This is important. °Contact a doctor if: °· You have another seizure. °· You have seizures more often. °· There is any change in what happens during your seizures. °· You continue to have  seizures with treatment. °· You have symptoms of being sick or having an infection. °Get help right away if: °· You have a seizure: °? That lasts longer than 5 minutes. °? That is different than seizures you had before. °? That makes it harder to breathe. °? After you hurt your head. °· After a seizure, you cannot speak or use a part of your body. °· After a seizure, you are confused or have a bad headache. °· You have two or more seizures in a row. °· You are having seizures more often. °· You do not wake up right after a seizure. °· You get hurt during a seizure. °In an emergency: °· These symptoms may be an emergency. Do not wait to see if the symptoms will go away. Get medical help right away. Call your local emergency services (911 in the [place] Do not drive yourself to the hospital. °This information is not intended to replace advice given to you by your health care provider. Make sure you discuss any questions you have with your health care provider. °Document Released: 01/22/2008 Document Revised: 04/17/2016 Document Reviewed: 04/17/2016 °Elsevier Interactive Patient Education © 2017 [employer] ° °

## 2016-12-04 NOTE — Progress Notes (Signed)
Patient has become increasingly agitated and combative beginning at 1145. The patient had a family friend and young child visiting at the time.  RN reassured patient, and was unsuccessful; obtained PRN ativan order for patient. Patient tolerated medication well; RN observing patient vital signs and further signs of agitiation.

## 2016-12-04 NOTE — Progress Notes (Addendum)
Duplicate. Did not start until 1600; work list requires completion

## 2016-12-04 NOTE — Progress Notes (Addendum)
Initial Nutrition Assessment  DOCUMENTATION CODES:   Non-severe (moderate) malnutrition in context of chronic illness, Underweight  INTERVENTION:    Boost Breeze po TID, each supplement provides 250 kcal and 9 grams of protein  NUTRITION DIAGNOSIS:   Malnutrition (moderate) related to chronic illness (alcoholism, prostate cancer) as evidenced by moderate depletion of body fat, moderate depletions of muscle mass  GOAL:   Patient will meet greater than or equal to 90% of their needs  MONITOR:   PO intake, Supplement acceptance, Labs, Weight trends, I & O's  REASON FOR ASSESSMENT:   Malnutrition Screening Tool  ASSESSMENT:   66 y.o.Male with a history of HTN, gout, arthritis, prostate cancer, anemia, and daily EtOH use who presents to the ED via EMS after having multiple seizures at home. He is a poor historian, but he thinks that his neighbor found him down and called 911. Per the ED attending, EMS personnel witnessed at least one seizure, described as "grand mal", in the field.   Pt was brought in by EMS for evaluation of seizures.  Hx of ETOH abuse. Not very conversant with RD.  Does reports his appetite is OK.   Currently on Clear Liquids.  Consumed 100% of breakfast tray. Medications reviewed and include folvite, MVI and thiamine.  Labs reviewed.  Sodium 133 (L).  Potassium 3.4 (L).  Nutrition-Focused physical exam completed. Findings are moderate fat depletion, moderate muscle depletion, and no edema.    Diet Order:  Diet clear liquid Room service appropriate? Yes; Fluid consistency: Thin  Skin:  Reviewed, no issues  Last BM:  4/18  Height:   Ht Readings from Last 1 Encounters:  12/02/16 5\' 6"  (1.676 m)   Weight:   Wt Readings from Last 1 Encounters:  12/02/16 110 lb 3.2 oz (50 kg)   Ideal Body Weight:  59 kg  BMI:  Body mass index is 17.79 kg/m.  Estimated Nutritional Needs:   Kcal:  8811-0315  Protein:  90-100 gm  Fluid:  1.7-1.9  L  EDUCATION NEEDS:   No education needs identified at this time  Arthur Holms, RD, LDN Pager #: [phone] After-Hours Pager #: [phone]

## 2016-12-04 NOTE — Progress Notes (Signed)
[facility]  PROGRESS NOTE  Shawn Hendrix MWN:[id number] DOB: 1950/12/28 DOA: 12/02/2016 PCP: No PCP Per Patient   Brief HPI:   66 y.o. gentleman with a history of HTN, gout, arthritis, prostate cancer, anemia, and daily EtOH use who presents to the ED via EMS after having multiple seizures at home.  He is a poor historian, but he thinks that his neighbor found him down and called 911.  Per the ED attending, EMS personnel witnessed at least one seizure, described as "grand mal", in the field.  The patient reports that he does not know when he had his last drink, but he suspects that it has been several days (at least).  No prior history of seizure.      Subjective   Patient denies chest pain or shortness of breath. No headache. No more seizures in the hospital.   Assessment/Plan:     1. Seizure- due to alcohol withdrawal, patient was seen by neurology, EEG was done which showed diffuse slowing. Neurology has signed off. He does not need antiepileptic drugs at this time. 2. Hypomagnesemia- magnesium today is 1.5, will give magnesium sulfate 2 g IV 1. Check magnesium level in a.m. 3. Hypokalemia-potassium is 3.4, will replace potassium with K deal 40 mg by mouth 1. Follow BMP in a.m. 4. Hypertension-blood pressure stable, continue Cardizem 240 mg by mouth daily, lisinopril 20 milligram by mouth daily 5. Anemia- likely chronic, today hemoglobin is 11.2, B12 435, folate 41.1, iron 68, TIBC 266, saturation 26%, ferritin 1003.  6. Prolonged QT interval on EKG- patient's admission EKG showed QTC prolongation with interrupted 508. Likely due to electrolyte abnormalities, which is replaced. Continue monitoring on telemetry. 7. 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    DVT prophylaxis: SCD'd  Code Status: Full code  Family Communication: No family at bedside   Disposition Plan: Likely discharge in next 24-48  hours.   Consultants:  Neurology  Procedures:  EEG-excess beta activity is mild diffuse slowing of the waking background.   Antibiotics:   Anti-infectives    None       Objective   Vitals:   12/04/16 0405 12/04/16 0800 12/04/16 0916 12/04/16 0954  BP: 113/84 127/65 133/83 133/83  Pulse: 66 71    Resp: 20 (!) 22    Temp: 98 F (36.7 C) 99 F (37.2 C)    TempSrc: Oral Oral    SpO2: 94%     Weight:      Height:        Intake/Output Summary (Last 24 hours) at 12/04/16 1216 Last data filed at 12/04/16 0919  Gross per 24 hour  Intake              843 ml  Output              300 ml  Net              543 ml   Filed Weights   12/02/16 2250  Weight: 50 kg (110 lb 3.2 oz)     Physical Examination:  General exam: Appears calm and comfortable. Respiratory system: Clear to auscultation. Respiratory effort normal. Cardiovascular system:  RRR. No  murmurs, rubs, gallops. No pedal edema. GI system: Abdomen is nondistended, soft and nontender. No organomegaly.  Central nervous system. No focal neurological deficits. 5 x 5 power in all extremities. Skin: No rashes, lesions or ulcers. Psychiatry: Alert, oriented x 3.Judgement and insight appear normal. Affect normal.    Data  Reviewed: I have personally reviewed following labs and imaging studies  CBG:  Recent Labs Lab 12/02/16 1627  GLUCAP 118*    CBC:  Recent Labs Lab 12/02/16 1630 12/03/16 0230 12/04/16 0240  WBC 5.2 10.5 6.5  NEUTROABS 4.2  --  4.5  HGB 7.6* 12.0* 11.2*  HCT 22.9* 34.6* 32.8*  MCV 115.1* 111.6* 110.8*  PLT 56* 89* 98*    Basic Metabolic Panel:  Recent Labs Lab 12/02/16 1900 12/03/16 0230 12/04/16 0240  NA 136 137 133*  K 3.0* 3.9 3.4*  CL 97* 101 97*  CO2 24 27 28   GLUCOSE 86 85 88  BUN <5* <5* 5*  CREATININE 0.97 0.80 0.76  CALCIUM 8.5* 8.0* 8.2*  MG 1.3*  --  1.5*    Recent Results (from the past 240 hour(s))  MRSA PCR Screening     Status: None   Collection  Time: 12/02/16 11:04 PM  Result Value Ref Range Status   MRSA by PCR NEGATIVE NEGATIVE Final    Comment:        The GeneXpert MRSA Assay (FDA approved for NASAL specimens only), is one component of a comprehensive MRSA colonization surveillance program. It is not intended to diagnose MRSA infection nor to guide or monitor treatment for MRSA infections.      Liver Function Tests:  Recent Labs Lab 12/02/16 2250 12/04/16 0240  AST 64* 50*  ALT 22 19  ALKPHOS 68 55  BILITOT 3.8* 3.7*  PROT 6.7 5.9*  ALBUMIN 3.4* 2.9*      Studies: Ct Head Wo Contrast  Result Date: 12/02/2016 CLINICAL DATA:  Acute onset of seizures.  Initial encounter. EXAM: CT HEAD WITHOUT CONTRAST TECHNIQUE: Contiguous axial images were obtained from the base of the skull through the vertex without intravenous contrast. COMPARISON:  None. FINDINGS: Brain: No evidence of acute infarction, hemorrhage, hydrocephalus, extra-axial collection or mass lesion/mass effect. Prominence of the ventricles and sulci reflects mild to moderate cortical volume loss. Cerebellar atrophy is noted. Scattered periventricular white matter change likely reflects small vessel ischemic microangiopathy. The brainstem and fourth ventricle are within normal limits. The basal ganglia are unremarkable in appearance. The cerebral hemispheres demonstrate grossly normal gray-white differentiation. No mass effect or midline shift is seen. Vascular: No hyperdense vessel or unexpected calcification. Skull: There is no evidence of fracture; visualized osseous structures are unremarkable in appearance. Sinuses/Orbits: The visualized portions of the orbits are grossly unremarkable. A right optic globe prosthesis is noted. The paranasal sinuses and mastoid air cells are well-aerated. Other: No significant soft tissue abnormalities are seen. IMPRESSION: 1. No acute intracranial pathology seen on CT. 2. Mild to moderate cortical volume loss and scattered small  vessel ischemic microangiopathy. Electronically Signed   By: Garald Balding M.D.   On: 12/02/2016 17:32    Scheduled Meds: . diltiazem  240 mg Oral Daily  . ferrous sulfate  325 mg Oral Q breakfast  . folic acid  1 mg Oral Daily  . lisinopril  20 mg Oral Daily  . LORazepam  0-4 mg Intravenous Q6H   Followed by  . LORazepam  0-4 mg Intravenous Q12H  . multivitamin  1 tablet Oral Daily  . pantoprazole  40 mg Oral Daily  . sodium chloride flush  3 mL Intravenous Q12H  . thiamine  100 mg Oral Daily      Time spent: 25 min  [facility] Pager [phone]. If 7PM-7AM, please contact night-coverage at [email or web address], Office  [phone]  password  TR[facility]1 12/04/2016, 12:16 PM  LOS: 1 day

## 2016-12-04 NOTE — Evaluation (Signed)
Physical Therapy Evaluation Patient Details Name: Shawn Hendrix MRN: [id number] DOB: 15-Oct-1950 Today's Date: 12/04/2016   History of Present Illness  HPI: Shawn Hendrix is a 66 y.o. gentleman with a history of HTN, gout, arthritis, prostate cancer, anemia, and daily EtOH use who presents to the ED via EMS after having multiple seizures at home.  He is a poor historian, but he thinks that his neighbor found him down and called 911.  Per the ED attending, EMS personnel witnessed at least one seizure, described as "grand mal", in the field.  The patient reports that he does not know when he had his last drink, but he suspects that it has been several days (at least).   Clinical Impression   Pt admitted with above diagnosis. Pt currently with functional limitations due to the deficits listed below (see PT Problem List). Presents with decr coordination, unsteadiness on feet; I am cautiously hopeful for good recovery after withdrawal period; Pt will benefit from skilled PT to increase their independence and safety with mobility to allow discharge to the venue listed below.       Follow Up Recommendations Other (comment) (Depends on recovery course, and if it can be confirmed that he has reliable assist at home)    Equipment Recommendations  Rolling walker with 5" wheels;3in1 (PT)    Recommendations for Other Services       Precautions / Restrictions Precautions Precautions: Fall Precaution Comments: Seizure      Mobility  Bed Mobility Overal bed mobility: Needs Assistance Bed Mobility: Supine to Sit     Supine to sit: Min assist     General bed mobility comments: Cues to initiate; very distracted by lines/leads; cues to attend to tasks  Transfers Overall transfer level: Needs assistance Equipment used: 1 person hand held assist Transfers: Sit to/from Stand Sit to Stand: Mod assist         General transfer comment: Mod assist to steady; sat back down without warning  due to posterior lean  Ambulation/Gait Ambulation/Gait assistance: Min assist;Mod assist   Assistive device: Rolling walker (2 wheeled) Gait Pattern/deviations: Trunk flexed (Erratic step width)     General Gait Details: Dependent on bil UE support for stability with amb  Stairs            Wheelchair Mobility    Modified Rankin (Stroke Patients Only)       Balance Overall balance assessment: Needs assistance   Sitting balance-Leahy Scale: Fair     Standing balance support: Bilateral upper extremity supported Standing balance-Leahy Scale: Poor                               Pertinent Vitals/Pain Pain Assessment: No/denies pain    Home Living Family/patient expects to be discharged to:: Private residence Living Arrangements: Alone Available Help at Discharge: Friend(s);Other (Comment);Family;Available PRN/intermittently Type of Home: Apartment Home Access: Stairs to enter   Entrance Stairs-Number of Steps: 3 Home Layout: One level        Prior Function Level of Independence: Independent         Comments: Tells me he has a RW he does not use     Hand Dominance        Extremity/Trunk Assessment   Upper Extremity Assessment Upper Extremity Assessment:  (decr coordination)    Lower Extremity Assessment Lower Extremity Assessment: Generalized weakness (uncoordinated stepping)       Communication   Communication: Other (  comment) (at times difficult to understand)  Cognition Arousal/Alertness: Awake/alert Behavior During Therapy: WFL for tasks assessed/performed;Impulsive Overall Cognitive Status: No family/caregiver present to determine baseline cognitive functioning                                        General Comments General comments (skin integrity, edema, etc.): VSS    Exercises     Assessment/Plan    PT Assessment Patient needs continued PT services  PT Problem List Decreased strength;Decreased  activity tolerance;Decreased balance;Decreased mobility;Decreased coordination;Decreased cognition;Decreased knowledge of use of DME;Decreased safety awareness;Decreased knowledge of precautions       PT Treatment Interventions DME instruction;Gait training;Stair training;Functional mobility training;Therapeutic activities;Therapeutic exercise;Balance training;Neuromuscular re-education;Cognitive remediation;Patient/family education    PT Goals (Current goals can be found in the Care Plan section)  Acute Rehab PT Goals Patient Stated Goal: Indicated he wants to go home today PT Goal Formulation: Patient unable to participate in goal setting Time For Goal Achievement: 12/18/16 Potential to Achieve Goals: Good    Frequency Min 3X/week   Barriers to discharge Decreased caregiver support      Co-evaluation               End of Session Equipment Utilized During Treatment: Gait belt Activity Tolerance: Patient tolerated treatment well Patient left: in chair;with call bell/phone within reach;with chair alarm set Nurse Communication: Mobility status PT Visit Diagnosis: Unsteadiness on feet (R26.81)    Time: 3428-7681 PT Time Calculation (min) (ACUTE ONLY): 22 min   Charges:   PT Evaluation $PT Eval Moderate Complexity: 1 Procedure     PT G Codes:        Roney Marion, PT  Acute Rehabilitation Services Pager [phone] Office [phone]   Colletta [place] 12/04/2016, 12:59 PM

## 2016-12-05 DIAGNOSIS — E440 Moderate protein-calorie malnutrition: Secondary | ICD-10-CM | POA: Diagnosis not present

## 2016-12-05 DIAGNOSIS — D649 Anemia, unspecified: Secondary | ICD-10-CM | POA: Diagnosis not present

## 2016-12-05 DIAGNOSIS — E876 Hypokalemia: Secondary | ICD-10-CM | POA: Diagnosis not present

## 2016-12-05 DIAGNOSIS — D696 Thrombocytopenia, unspecified: Secondary | ICD-10-CM | POA: Diagnosis not present

## 2016-12-05 DIAGNOSIS — F10230 Alcohol dependence with withdrawal, uncomplicated: Secondary | ICD-10-CM | POA: Diagnosis not present

## 2016-12-05 DIAGNOSIS — F10239 Alcohol dependence with withdrawal, unspecified: Secondary | ICD-10-CM | POA: Diagnosis not present

## 2016-12-05 DIAGNOSIS — R569 Unspecified convulsions: Secondary | ICD-10-CM | POA: Diagnosis not present

## 2016-12-05 LAB — COMPREHENSIVE METABOLIC PANEL
ALT: 23 U/L (ref 17–63)
ANION GAP: 8 (ref 5–15)
AST: 53 U/L — ABNORMAL HIGH (ref 15–41)
Albumin: 3.1 g/dL — ABNORMAL LOW (ref 3.5–5.0)
Alkaline Phosphatase: 61 U/L (ref 38–126)
BUN: <5 mg/dL — ABNORMAL LOW (ref 6–20)
CHLORIDE: 97 mmol/L — AB (ref 101–111)
CO2: 28 mmol/L (ref 22–32)
Calcium: 8.4 mg/dL — ABNORMAL LOW (ref 8.9–10.3)
Creatinine, Ser: 0.69 mg/dL (ref 0.61–1.24)
GFR calc non Af Amer: >60 mL/min (ref >60)
Glucose, Bld: 84 mg/dL (ref 65–99)
Potassium: 3.1 mmol/L — ABNORMAL LOW (ref 3.5–5.1)
SODIUM: 133 mmol/L — AB (ref 135–145)
Total Bilirubin: 2 mg/dL — ABNORMAL HIGH (ref 0.3–1.2)
Total Protein: 6.5 g/dL (ref 6.5–8.1)

## 2016-12-05 LAB — MAGNESIUM: MAGNESIUM: 1.6 mg/dL — AB (ref 1.7–2.4)

## 2016-12-05 MED ORDER — MAGNESIUM SULFATE 2 GM/50ML IV SOLN
2.0000 g | Freq: Once | INTRAVENOUS | Status: AC
  Administered 2016-12-05: 2 g via INTRAVENOUS
  Filled 2016-12-05: qty 50

## 2016-12-05 MED ORDER — SODIUM CHLORIDE 0.9 % IV SOLN
30.0000 meq | Freq: Once | INTRAVENOUS | Status: AC
  Administered 2016-12-05: 30 meq via INTRAVENOUS
  Filled 2016-12-05: qty 15

## 2016-12-05 NOTE — Evaluation (Signed)
Occupational Therapy Evaluation Patient Details Name: Shawn Hendrix MRN: [id number] DOB: [birth year] Today's Date: 12/05/2016    History of Present Illness HPI: Shawn Hendrix is a 66 y.o. gentleman with a history of HTN, gout, arthritis, prostate cancer, anemia, and daily EtOH use who presents to the ED via EMS after having multiple seizures at home.  He is a poor historian, but he thinks that his neighbor found him down and called 911.  Per the ED attending, EMS personnel witnessed at least one seizure, described as "grand mal", in the field.  The patient reports that he does not know when he had his last drink, but he suspects that it has been several days (at least).    Clinical Impression   Pt was independent prior to admission. Presents with generalized weakness, poor sitting and standing balance, impaired cognition and incoordination. He required B hand held assist to walk a short distance this visit and min to total assist for ADL. Per family, his sister can care for pt upon discharge. Will follow acutely. At this time recommending SNF for rehab.    Follow Up Recommendations  SNF;Supervision/Assistance - 24 hour    Equipment Recommendations       Recommendations for Other Services       Precautions / Restrictions Precautions Precautions: Fall Precaution Comments: Seizure Restrictions Weight Bearing Restrictions: No      Mobility Bed Mobility Overal bed mobility: Needs Assistance Bed Mobility: Supine to Sit     Supine to sit: Mod assist     General bed mobility comments: assist to raise trunk and advance hips to EOB, increased time  Transfers Overall transfer level: Needs assistance Equipment used: 2 person hand held assist Transfers: Sit to/from Stand Sit to Stand: +2 physical assistance;Min assist         General transfer comment: assist to rise and steady    Balance Overall balance assessment: Needs assistance   Sitting balance-Leahy Scale:  Poor Sitting balance - Comments: posterior lean   Standing balance support: Bilateral upper extremity supported Standing balance-Leahy Scale: Poor                             ADL either performed or assessed with clinical judgement   ADL Overall ADL's : Needs assistance/impaired Eating/Feeding: Minimal assistance;Sitting Eating/Feeding Details (indicate cue type and reason): increased time and spillage Grooming: Wash/dry hands;Wash/dry face;Sitting;Minimal assistance Grooming Details (indicate cue type and reason): assist to maintain sitting balance as he washed his face at EOB Upper Body Bathing: Maximal assistance;Sitting   Lower Body Bathing: Total assistance;+2 for physical assistance;Sit to/from stand   Upper Body Dressing : Maximal assistance;Sitting   Lower Body Dressing: Total assistance;+2 for physical assistance;Sit to/from stand               Functional mobility during ADLs: +2 for physical assistance;Moderate assistance (ambulated around bed with hand held assist)       Vision Baseline Vision/History:  (blind in R eye) Patient Visual Report: No change from baseline       Perception     Praxis      Pertinent Vitals/Pain Pain Assessment: No/denies pain     Hand Dominance Right   Extremity/Trunk Assessment Upper Extremity Assessment Upper Extremity Assessment: RUE deficits/detail;LUE deficits/detail RUE Deficits / Details: longstanding shoulder limitations per family RUE Coordination: decreased fine motor;decreased gross motor LUE Coordination: decreased fine motor;decreased gross motor   Lower Extremity Assessment Lower Extremity Assessment:  Defer to PT evaluation   Cervical / Trunk Assessment Cervical / Trunk Assessment:  (scoliosis)   Communication Communication Communication: Expressive difficulties (at time difficult to understand)   Cognition Arousal/Alertness: Awake/alert Behavior During Therapy: Flat affect Overall  Cognitive Status: Impaired/Different from baseline Area of Impairment: Orientation;Memory;Safety/judgement;Problem solving;Following commands                 Orientation Level: Disoriented to;Time;Situation   Memory: Decreased short-term memory Following Commands: Follows one step commands with increased time Safety/Judgement: Decreased awareness of safety;Decreased awareness of deficits   Problem Solving: Slow processing;Decreased initiation;Requires verbal cues;Requires tactile cues General Comments: pt reports he uses his L hand to eat, but then ate with his R   General Comments       Exercises     Shoulder Instructions      Home Living Family/patient expects to be discharged to:: Private residence Living Arrangements: Alone Available Help at Discharge: Family;Available 24 hours/day                             Additional Comments: per sister in law and brother, pt will go home with his sister, family can provide 24 hour care.      Prior Functioning/Environment Level of Independence: Independent                 OT Problem List: Decreased strength;Decreased activity tolerance;Impaired balance (sitting and/or standing);Decreased coordination;Decreased cognition;Decreased safety awareness;Decreased knowledge of use of DME or AE;Impaired UE functional use      OT Treatment/Interventions: Self-care/ADL training;DME and/or AE instruction;Therapeutic activities;Patient/family education;Balance training;Cognitive remediation/compensation    OT Goals(Current goals can be found in the care plan section) Acute Rehab OT Goals Patient Stated Goal: did not state OT Goal Formulation: Patient unable to participate in goal setting Time For Goal Achievement: 12/19/16 Potential to Achieve Goals: Good ADL Goals Pt Will Perform Eating: Independently;sitting Pt Will Perform Grooming: with supervision;standing Pt Will Perform Upper Body Dressing: with set-up;with  supervision;sitting Pt Will Perform Lower Body Dressing: with supervision;sit to/from stand Pt Will Transfer to Toilet: with supervision;ambulating;regular height toilet Pt Will Perform Toileting - Clothing Manipulation and hygiene: with supervision;sit to/from stand  OT Frequency: Min 2X/week   Barriers to D/C:            Co-evaluation              End of Session Equipment Utilized During Treatment: Gait belt Nurse Communication: Mobility status  Activity Tolerance: Patient tolerated treatment well Patient left: in chair;with call bell/phone within reach;with chair alarm set;with family/visitor present  OT Visit Diagnosis: Unsteadiness on feet (R26.81);Other abnormalities of gait and mobility (R26.89);Muscle weakness (generalized) (M62.81);Cognitive communication deficit (R41.841);Other symptoms and signs involving cognitive function                Time: 2671-2458 OT Time Calculation (min): 39 min Charges:  OT General Charges $OT Visit: 1 Procedure OT Evaluation $OT Eval Moderate Complexity: 1 Procedure OT Treatments $Self Care/Home Management : 23-37 mins G-Codes:      Malka So 12/05/2016, 9:27 AM  [phone]

## 2016-12-05 NOTE — Progress Notes (Signed)
[facility]  PROGRESS NOTE  Shawn Hendrix JME:[id number] DOB: 10/30/1950 DOA: 12/02/2016 PCP: No PCP Per Patient   Brief HPI:   66 y.o. gentleman with a history of HTN, gout, arthritis, prostate cancer, anemia, and daily EtOH use who presents to the ED via EMS after having multiple seizures at home.  He is a poor historian, but he thinks that his neighbor found him down and called 911.  Per the ED attending, EMS personnel witnessed at least one seizure, described as "grand mal", in the field.  The patient reports that he does not know when he had his last drink, but he suspects that it has been several days (at least).  No prior history of seizure.      Subjective   Patient seen and examined, Appears lethargic. He knows that he is in the hospital. Became confused yesterday requiring additional doses of Ativan.   Assessment/Plan:     1. Seizure- due to alcohol withdrawal, patient was seen by neurology, EEG was done which showed diffuse slowing. Neurology has signed off. He does not need antiepileptic drugs at this time.No more seizures in the hospital. 2. Alcohol withdrawal- patient became confused yesterday requiring additional doses of Ativan. Continue with CIWA protocol. 3. Hypomagnesemia- magnesium yesterday was 1.5, he was given magnesium sulfate 2 g IV 1. This morning magnesium is 1.6. Will give another dose of mag sulfate and recheck  magnesium level in a.m. 4. Hypokalemia-potassium is 3.1, will replace potassium with KCl 10 mg IV 3 and follow BMP in a.m. 5. Hypertension-blood pressure stable, continue Cardizem 240 mg by mouth daily, lisinopril 20 milligram by mouth daily 6. Anemia- likely chronic,  hemoglobin is 11.2, B12 435, folate 41.1, iron 68, TIBC 266, saturation 26%, ferritin 1003.  7. Prolonged QT interval on EKG- patient's admission EKG showed QTC prolongation with interrupted 508. Likely due to electrolyte abnormalities, which is being  replaced. Continue  monitoring on telemetry. 8. 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    DVT prophylaxis: SCD'd  Code Status: Full code  Family Communication: No family at bedside   Disposition Plan: Likely discharge in next 24-48 hours.   Consultants:  Neurology  Procedures:  EEG-excess beta activity is mild diffuse slowing of the waking background.   Antibiotics:   Anti-infectives    None       Objective   Vitals:   12/05/16 0400 12/05/16 0500 12/05/16 0600 12/05/16 0816  BP: (!) 154/95 125/85 120/86 (!) 140/102  Pulse: 91 80 80 89  Resp:    16  Temp:    98.2 F (36.8 C)  TempSrc:    Oral  SpO2: 100% 100% 100% 100%  Weight:      Height:        Intake/Output Summary (Last 24 hours) at 12/05/16 1117 Last data filed at 12/05/16 0817  Gross per 24 hour  Intake              474 ml  Output             1625 ml  Net            -1151 ml   Filed Weights   12/02/16 2250  Weight: 50 kg (110 lb 3.2 oz)     Physical Examination:  Physical Exam: Eyes: No icterus, extraocular muscles intact  Mouth: Oral mucosa is moist, no lesions on palate,  Neck: Supple, no deformities, masses, or tenderness Lungs: Normal respiratory effort, bilateral clear to auscultation,  no crackles or wheezes.  Heart: Regular rate and rhythm, S1 and S2 normal, no murmurs, rubs auscultated Abdomen: BS normoactive,soft,nondistended,non-tender to palpation,no organomegaly Extremities: No pretibial edema, no erythema, no cyanosis, no clubbing Neuro : Alert and oriented to time, place and person, No focal deficits Skin: No rashes seen on exam    Data Reviewed: I have personally reviewed following labs and imaging studies  CBG:  Recent Labs Lab 12/02/16 1627  GLUCAP 118*    CBC:  Recent Labs Lab 12/02/16 1630 12/03/16 0230 12/04/16 0240  WBC 5.2 10.5 6.5  NEUTROABS 4.2  --  4.5  HGB 7.6* 12.0* 11.2*  HCT 22.9* 34.6* 32.8*  MCV 115.1* 111.6* 110.8*  PLT  56* 89* 98*    Basic Metabolic Panel:  Recent Labs Lab 12/02/16 1900 12/03/16 0230 12/04/16 0240 12/05/16 0253  NA 136 137 133* 133*  K 3.0* 3.9 3.4* 3.1*  CL 97* 101 97* 97*  CO2 24 27 28 28   GLUCOSE 86 85 88 84  BUN <5* <5* 5* <5*  CREATININE 0.97 0.80 0.76 0.69  CALCIUM 8.5* 8.0* 8.2* 8.4*  MG 1.3*  --  1.5* 1.6*    Recent Results (from the past 240 hour(s))  MRSA PCR Screening     Status: None   Collection Time: 12/02/16 11:04 PM  Result Value Ref Range Status   MRSA by PCR NEGATIVE NEGATIVE Final    Comment:        The GeneXpert MRSA Assay (FDA approved for NASAL specimens only), is one component of a comprehensive MRSA colonization surveillance program. It is not intended to diagnose MRSA infection nor to guide or monitor treatment for MRSA infections.      Liver Function Tests:  Recent Labs Lab 12/02/16 2250 12/04/16 0240 12/05/16 0253  AST 64* 50* 53*  ALT 22 19 23   ALKPHOS 68 55 61  BILITOT 3.8* 3.7* 2.0*  PROT 6.7 5.9* 6.5  ALBUMIN 3.4* 2.9* 3.1*      Studies: No results found.  Scheduled Meds: . diltiazem  240 mg Oral Daily  . feeding supplement  1 Container Oral TID BM  . ferrous sulfate  325 mg Oral Q breakfast  . folic acid  1 mg Oral Daily  . lisinopril  20 mg Oral Daily  . LORazepam  0-4 mg Intravenous Q12H  . multivitamin  1 tablet Oral Daily  . pantoprazole  40 mg Oral Daily  . sodium chloride flush  3 mL Intravenous Q12H  . thiamine  100 mg Oral Daily      Time spent: 25 min  [facility] Pager [phone]. If 7PM-7AM, please contact night-coverage at [email or web address], Office  [phone]  password [email or web address] 12/05/2016, 11:17 AM  LOS: 2 days

## 2016-12-05 NOTE — NC FL2 (Signed)
[place] LEVEL OF CARE SCREENING TOOL     IDENTIFICATION  Patient Name: Shawn Hendrix Birthdate: Mar 17, 1951 Sex: male Admission Date (Current Location): 12/02/2016  [facility] and [place] Number:  Herbalist and Address:  [facility], [place] [address], [place], [place] [address]      Provider Number: [id number]  Attending Physician Name and Address:  Oswald Hillock, MD  Relative Name and Phone Number:       Current Level of Care: Hospital Recommended Level of Care: [place] Prior Approval Number:    Date Approved/Denied:   PASRR Number:   [id number]   Discharge Plan: SNF    Current Diagnoses: Patient Active Problem List   Diagnosis Date Noted  . Malnutrition of moderate degree 12/05/2016  . Seizure ([place]) 12/02/2016  . Alcohol withdrawal seizure, uncomplicated ([place]) 92/33/0076  . Anemia 12/02/2016  . Thrombocytopenia ([place]) 12/02/2016  . Hypokalemia 12/02/2016  . Malignant neoplasm of prostate ([place]) 10/31/2014    Orientation RESPIRATION BLADDER Height & Weight     Self, Place  Normal Continent Weight: 110 lb 3.2 oz (50 kg) Height:  5\' 6"  (167.6 cm)  BEHAVIORAL SYMPTOMS/MOOD NEUROLOGICAL BOWEL NUTRITION STATUS      Continent Diet  AMBULATORY STATUS COMMUNICATION OF NEEDS Skin   Limited Assist Verbally Normal                       Personal Care Assistance Level of Assistance  Bathing, Dressing Bathing Assistance: Limited assistance   Dressing Assistance: Limited assistance     Functional Limitations Info             SPECIAL CARE FACTORS FREQUENCY  PT (By licensed PT), OT (By licensed OT)     PT Frequency: 5/wk OT Frequency: 5/wk            Contractures      Additional Factors Info  Code Status, Allergies, Psychotropic Code Status Info: FULL Allergies Info: NKA Psychotropic Info: ativan         Current Medications (12/05/2016):  This is the current hospital active  medication list Current Facility-Administered Medications  Medication Dose Route Frequency Provider Last Rate Last Dose  . acetaminophen (TYLENOL) tablet 650 mg  650 mg Oral Q6H PRN Lily Kocher, MD       Or  . acetaminophen (TYLENOL) suppository 650 mg  650 mg Rectal Q6H PRN Lily Kocher, MD      . diltiazem (CARDIZEM CD) 24 hr capsule 240 mg  240 mg Oral Daily Lily Kocher, MD   240 mg at 12/05/16 0849  . feeding supplement (BOOST / RESOURCE BREEZE) liquid 1 Container  1 Container Oral TID BM Oswald Hillock, MD   1 Container at 12/05/16 [phone]  . ferrous sulfate tablet 325 mg  325 mg Oral Q breakfast Lily Kocher, MD   325 mg at 12/05/16 0848  . folic acid (FOLVITE) tablet 1 mg  1 mg Oral Daily Kshitiz Alekh, MD   1 mg at 12/05/16 0849  . lisinopril (PRINIVIL,ZESTRIL) tablet 20 mg  20 mg Oral Daily Lily Kocher, MD   20 mg at 12/05/16 0849  . LORazepam (ATIVAN) injection 0-4 mg  0-4 mg Intravenous Q12H Forde Dandy, MD   1 mg at 12/05/16 [phone]  . LORazepam (ATIVAN) injection 1 mg  1 mg Intravenous Q4H PRN Oswald Hillock, MD   1 mg at 12/05/16 0320  . magnesium sulfate IVPB 2  g 50 mL  2 g Intravenous Once Oswald Hillock, MD 50 mL/hr at 12/05/16 1208 2 g at 12/05/16 1208  . multivitamin (PROSIGHT) tablet 1 tablet  1 tablet Oral Daily Aline August, MD   1 tablet at 12/05/16 0849  . ondansetron (ZOFRAN) tablet 4 mg  4 mg Oral Q6H PRN Lily Kocher, MD       Or  . ondansetron [facility]) injection 4 mg  4 mg Intravenous Q6H PRN Lily Kocher, MD      . pantoprazole (PROTONIX) EC tablet 40 mg  40 mg Oral Daily Lily Kocher, MD   40 mg at 12/05/16 0849  . potassium chloride 30 mEq in sodium chloride 0.9 % 265 mL (KCL MULTIRUN) IVPB  30 mEq Intravenous Once Oswald Hillock, MD      . sodium chloride flush (NS) 0.9 % injection 3 mL  3 mL Intravenous Q12H Lily Kocher, MD   3 mL at 12/05/16 0858  . thiamine (VITAMIN B-1) tablet 100 mg  100 mg Oral Daily Kshitiz Alekh, MD   100 mg at 12/05/16 0761     Discharge  Medications: Please see discharge summary for a list of discharge medications.  Relevant Imaging Results:  Relevant Lab Results:   Additional Information  SS#: [id number]  Jorge Ny, LCSW

## 2016-12-05 NOTE — Clinical Social Work Note (Signed)
Clinical Social Work Assessment  Patient Details  Name: Shawn Hendrix MRN: [id number] Date of Birth: Aug 24, 1950  Date of referral:  12/05/16               Reason for consult:  Facility Placement                Permission sought to share information with:  Chartered certified accountant granted to share information::  Yes, Verbal Permission Granted  Name::     Engineer, manufacturing::  SNF  Relationship::  sister  Contact Information:     Housing/Transportation Living arrangements for the past 2 months:  Single Family Home Source of Information:   (siblings) Patient Interpreter Needed:  None Criminal Activity/Legal Involvement Pertinent to Current Situation/Hospitalization:  No - Comment as needed Significant Relationships:  Siblings, Other Family Members Lives with:  Self Do you feel safe going back to the place where you live?  No Need for family participation in patient care:  Yes (Comment) (help with decision making)  Care giving concerns: Pt lives at home alone and would not have 24 hour help from family at time of DC- given current impairment pt would not be safe to care for himself at home.   Social Worker assessment / plan:  CSW met with pt family at bedside (2 sisters and a niece) and discussed OT recommendation for SNF.  Family agrees that they could not provide 24 hour help to pt right now.  CSW explained SNF and SNF referral process.  Employment status:  Retired Forensic scientist:  Medicare PT Recommendations:  [place] / Referral to community resources:  [place]  Patient/Family's Response to care: Family agreeable to SNF stay for patient- patient disoriented during interview so unsure if he will be agreeable when he reorients.  Patient/Family's Understanding of and Emotional Response to Diagnosis, Current Treatment, and Prognosis:  Family has good understanding of pt needs but hopeful he will recover and be able  to return home.  Emotional Assessment Appearance:  Appears stated age, Disheveled Attitude/Demeanor/Rapport:    Affect (typically observed):  Appropriate Orientation:  Oriented to Self, Oriented to Place Alcohol / Substance use:  Alcohol Use Psych involvement (Current and /or in the community):  No (Comment)  Discharge Needs  Concerns to be addressed:    Readmission within the last 30 days:  No Current discharge risk:  Physical Impairment, Substance Abuse Barriers to Discharge:  Continued Medical Work up   Jorge Ny, LCSW 12/05/2016, 4:39 PM

## 2016-12-06 DIAGNOSIS — D649 Anemia, unspecified: Secondary | ICD-10-CM | POA: Diagnosis not present

## 2016-12-06 DIAGNOSIS — F10239 Alcohol dependence with withdrawal, unspecified: Secondary | ICD-10-CM | POA: Diagnosis not present

## 2016-12-06 DIAGNOSIS — R569 Unspecified convulsions: Secondary | ICD-10-CM | POA: Diagnosis not present

## 2016-12-06 DIAGNOSIS — D696 Thrombocytopenia, unspecified: Secondary | ICD-10-CM | POA: Diagnosis not present

## 2016-12-06 DIAGNOSIS — F10230 Alcohol dependence with withdrawal, uncomplicated: Secondary | ICD-10-CM | POA: Diagnosis not present

## 2016-12-06 DIAGNOSIS — E876 Hypokalemia: Secondary | ICD-10-CM | POA: Diagnosis not present

## 2016-12-06 LAB — BASIC METABOLIC PANEL
ANION GAP: 9 (ref 5–15)
BUN: <5 mg/dL — ABNORMAL LOW (ref 6–20)
CALCIUM: 8.4 mg/dL — AB (ref 8.9–10.3)
CHLORIDE: 98 mmol/L — AB (ref 101–111)
CO2: 26 mmol/L (ref 22–32)
Creatinine, Ser: 0.65 mg/dL (ref 0.61–1.24)
GFR calc Af Amer: >60 mL/min (ref >60)
GFR calc non Af Amer: >60 mL/min (ref >60)
GLUCOSE: 108 mg/dL — AB (ref 65–99)
Potassium: 3.4 mmol/L — ABNORMAL LOW (ref 3.5–5.1)
Sodium: 133 mmol/L — ABNORMAL LOW (ref 135–145)

## 2016-12-06 LAB — MAGNESIUM: Magnesium: 1.8 mg/dL (ref 1.7–2.4)

## 2016-12-06 NOTE — Progress Notes (Signed)
Physical Therapy Treatment Patient Details Name: Shawn Hendrix MRN: [id number] DOB: Mar 07, 1951 Today's Date: 12/06/2016    History of Present Illness HPI: Shawn Hendrix is a 66 y.o. gentleman with a history of HTN, gout, arthritis, prostate cancer, anemia, and daily EtOH use who presents to the ED via EMS after having multiple seizures at home.  He is a poor historian, but he thinks that his neighbor found him down and called 911.  Per the ED attending, EMS personnel witnessed at least one seizure, described as "grand mal", in the field.  The patient reports that he does not know when he had his last drink, but he suspects that it has been several days (at least).     PT Comments    Pt presented supine in bed with HOB elevated, awake and willing to participate in therapy session. Pt making slow progress with mobility and continues to require two person assistance for safety with bed mobility and transfers. All VSS throughout. Pt would continue to benefit from skilled physical therapy services at this time while admitted and after d/c to address the below listed limitations in order to improve overall safety and independence with functional mobility.    Follow Up Recommendations  SNF;Supervision/Assistance - 24 hour     Equipment Recommendations  None recommended by PT;Other (comment) (defer to next venue)    Recommendations for Other Services       Precautions / Restrictions Precautions Precautions: Fall Precaution Comments: Seizure Restrictions Weight Bearing Restrictions: No    Mobility  Bed Mobility Overal bed mobility: Needs Assistance Bed Mobility: Supine to Sit     Supine to sit: Mod assist     General bed mobility comments: increased time, VC'ing for sequencing, mod A to elevate trunk and use of bed pads to position pt's hips at EOB  Transfers Overall transfer level: Needs assistance Equipment used: Rolling walker (2 wheeled) Transfers: Sit to/from  [facility] Sit to Stand: Mod assist;+2 safety/equipment Stand pivot transfers: Mod assist;+2 physical assistance;+2 safety/equipment       General transfer comment: increased time, verbal and tactile cues for bilateral hand placement, mod A x2 for stability and to rise from bed, mod A x2 for pivotal movements from bed to recliner chair with assistance for moving RW  Ambulation/Gait                 Stairs            Wheelchair Mobility    Modified Rankin (Stroke Patients Only)       Balance Overall balance assessment: Needs assistance Sitting-balance support: Feet supported Sitting balance-Leahy Scale: Poor Sitting balance - Comments: pt progressing from mod A to close min guard with UE supports Postural control: Posterior lean Standing balance support: Bilateral upper extremity supported Standing balance-Leahy Scale: Poor Standing balance comment: pt reliant on bilateral UEs on RW                            Cognition Arousal/Alertness: Awake/alert Behavior During Therapy: Flat affect Overall Cognitive Status: Impaired/Different from baseline Area of Impairment: Memory;Following commands;Safety/judgement;Problem solving                     Memory: Decreased short-term memory Following Commands: Follows one step commands with increased time Safety/Judgement: Decreased awareness of safety;Decreased awareness of deficits   Problem Solving: Slow processing;Decreased initiation;Requires verbal cues;Requires tactile cues  Exercises      General Comments        Pertinent Vitals/Pain Pain Assessment: Faces Faces Pain Scale: Hurts even more Pain Location: bilateral UEs Pain Descriptors / Indicators: Grimacing;Guarding;Sore Pain Intervention(s): Monitored during session;Repositioned    Home Living                      Prior Function            PT Goals (current goals can now be found in the care  plan section) Acute Rehab PT Goals PT Goal Formulation: Patient unable to participate in goal setting Time For Goal Achievement: 12/18/16 Potential to Achieve Goals: Good Progress towards PT goals: Progressing toward goals    Frequency    Min 3X/week      PT Plan Current plan remains appropriate    Co-evaluation             End of Session Equipment Utilized During Treatment: Gait belt Activity Tolerance: Patient tolerated treatment well Patient left: in chair;with call bell/phone within reach;with chair alarm set;with family/visitor present Nurse Communication: Mobility status PT Visit Diagnosis: Unsteadiness on feet (R26.81)     Time: 7829-5621 PT Time Calculation (min) (ACUTE ONLY): 16 min  Charges:  $Therapeutic Activity: 8-22 mins                    G Codes:       [place], PT, DPT [place] 12/06/2016, 1:52 PM

## 2016-12-06 NOTE — Progress Notes (Signed)
[facility]  PROGRESS NOTE  Shawn Hendrix YKD:[id number] DOB: [birth year] DOA: 12/02/2016 PCP: No PCP Per Patient   Brief HPI:   66 y.o. gentleman with a history of HTN, gout, arthritis, prostate cancer, anemia, and daily EtOH use who presents to the ED via EMS after having multiple seizures at home.  He is a poor historian, but he thinks that his neighbor found him down and called 911.  Per the ED attending, EMS personnel witnessed at least one seizure, described as "grand mal", in the field.  The patient reports that he does not know when he had his last drink, but he suspects that it has been several days (at least).  No prior history of seizure.      Subjective   Patient seen and examined, denies pain or shortness of breath.    Assessment/Plan:     1. Seizure- due to alcohol withdrawal, patient was seen by neurology, EEG was done which showed diffuse slowing. Neurology has signed off. He does not need antiepileptic drugs at this time.No more seizures in the hospital. 2. Alcohol withdrawal- patient became confused requiring additional doses of Ativan. Continue with CIWA protocol. 3. Hypomagnesemia- Replaced: magnesium  was 1.5, he was given magnesium sulfate 2 g IV 1. This morning magnesium is 1.8. 4. Hypokalemia-potassium is 3.4 this morning, will give K-Dur 40 mg by mouth 1 follow BMP in a.m. 5. Hypertension-blood pressure stable, continue Cardizem 240 mg by mouth daily, lisinopril 20 milligram by mouth daily 6. Anemia- likely chronic,  hemoglobin is 11.2, B12 435, folate 41.1, iron 68, TIBC 266, saturation 26%, ferritin 1003.  7. Prolonged QT interval on EKG- patient's admission EKG showed QTC prolongation with interrupted 508. Likely due to electrolyte abnormalities, which is being  replaced. Continue monitoring on telemetry. 8. 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    DVT prophylaxis: SCD'd  Code Status: Full code  Family  Communication: No family at bedside   Disposition Plan: Skilled nursing facility   Consultants:  Neurology  Procedures:  EEG-excess beta activity is mild diffuse slowing of the waking background.   Antibiotics:   Anti-infectives    None       Objective   Vitals:   12/06/16 0300 12/06/16 0309 12/06/16 0500 12/06/16 0800  BP: 110/80 121/79 112/79 (!) 133/97  Pulse: 85 84 81 95  Resp: 15 19 17 15   Temp:  99.3 F (37.4 C)  99.9 F (37.7 C)  TempSrc:  Axillary  Oral  SpO2: 100% 100% 100% 100%  Weight:      Height:        Intake/Output Summary (Last 24 hours) at 12/06/16 1239 Last data filed at 12/06/16 1000  Gross per 24 hour  Intake             1102 ml  Output             1326 ml  Net             -224 ml   Filed Weights   12/02/16 2250  Weight: 50 kg (110 lb 3.2 oz)     Physical Examination:  Physical Exam: Eyes: No icterus, extraocular muscles intact  Mouth: Oral mucosa is moist, no lesions on palate,  Neck: Supple, no deformities, masses, or tenderness Lungs: Normal respiratory effort, bilateral clear to auscultation, no crackles or wheezes.  Heart: Regular rate and rhythm, S1 and S2 normal, no murmurs, rubs auscultated Abdomen: BS normoactive,soft,nondistended,non-tender to palpation,no organomegaly Extremities: No  pretibial edema, no erythema, no cyanosis, no clubbing Neuro : Alert and oriented to time, place and person, No focal deficits     Data Reviewed: I have personally reviewed following labs and imaging studies  CBG:  Recent Labs Lab 12/02/16 1627  GLUCAP 118*    CBC:  Recent Labs Lab 12/02/16 1630 12/03/16 0230 12/04/16 0240  WBC 5.2 10.5 6.5  NEUTROABS 4.2  --  4.5  HGB 7.6* 12.0* 11.2*  HCT 22.9* 34.6* 32.8*  MCV 115.1* 111.6* 110.8*  PLT 56* 89* 98*    Basic Metabolic Panel:  Recent Labs Lab 12/02/16 1900 12/03/16 0230 12/04/16 0240 12/05/16 0253 12/06/16 0244  NA 136 137 133* 133* 133*  K 3.0* 3.9 3.4*  3.1* 3.4*  CL 97* 101 97* 97* 98*  CO2 24 27 28 28 26   GLUCOSE 86 85 88 84 108*  BUN <5* <5* 5* <5* <5*  CREATININE 0.97 0.80 0.76 0.69 0.65  CALCIUM 8.5* 8.0* 8.2* 8.4* 8.4*  MG 1.3*  --  1.5* 1.6* 1.8    Recent Results (from the past 240 hour(s))  MRSA PCR Screening     Status: None   Collection Time: 12/02/16 11:04 PM  Result Value Ref Range Status   MRSA by PCR NEGATIVE NEGATIVE Final    Comment:        The GeneXpert MRSA Assay (FDA approved for NASAL specimens only), is one component of a comprehensive MRSA colonization surveillance program. It is not intended to diagnose MRSA infection nor to guide or monitor treatment for MRSA infections.      Liver Function Tests:  Recent Labs Lab 12/02/16 2250 12/04/16 0240 12/05/16 0253  AST 64* 50* 53*  ALT 22 19 23   ALKPHOS 68 55 61  BILITOT 3.8* 3.7* 2.0*  PROT 6.7 5.9* 6.5  ALBUMIN 3.4* 2.9* 3.1*      Studies: No results found.  Scheduled Meds: . diltiazem  240 mg Oral Daily  . feeding supplement  1 Container Oral TID BM  . ferrous sulfate  325 mg Oral Q breakfast  . folic acid  1 mg Oral Daily  . lisinopril  20 mg Oral Daily  . LORazepam  0-4 mg Intravenous Q12H  . multivitamin  1 tablet Oral Daily  . pantoprazole  40 mg Oral Daily  . sodium chloride flush  3 mL Intravenous Q12H  . thiamine  100 mg Oral Daily      Time spent: 25 min  [facility] Pager [phone]. If 7PM-7AM, please contact night-coverage at [email or web address], Office  [phone]  password [email or web address] 12/06/2016, 12:39 PM  LOS: 3 days

## 2016-12-06 NOTE — Clinical Social Work Note (Signed)
CSW left voicemail for patient's sister, Inez Catalina. Will provide bed offers once she returns call.  Dayton Scrape, [place]

## 2016-12-07 ENCOUNTER — Inpatient Hospital Stay (HOSPITAL_COMMUNITY): Payer: Medicare Other

## 2016-12-07 DIAGNOSIS — F10239 Alcohol dependence with withdrawal, unspecified: Secondary | ICD-10-CM | POA: Diagnosis not present

## 2016-12-07 DIAGNOSIS — E876 Hypokalemia: Secondary | ICD-10-CM | POA: Diagnosis not present

## 2016-12-07 DIAGNOSIS — R509 Fever, unspecified: Secondary | ICD-10-CM | POA: Diagnosis not present

## 2016-12-07 DIAGNOSIS — R569 Unspecified convulsions: Secondary | ICD-10-CM | POA: Diagnosis not present

## 2016-12-07 DIAGNOSIS — D696 Thrombocytopenia, unspecified: Secondary | ICD-10-CM | POA: Diagnosis not present

## 2016-12-07 DIAGNOSIS — F10230 Alcohol dependence with withdrawal, uncomplicated: Secondary | ICD-10-CM | POA: Diagnosis not present

## 2016-12-07 DIAGNOSIS — E440 Moderate protein-calorie malnutrition: Secondary | ICD-10-CM | POA: Diagnosis not present

## 2016-12-07 LAB — URINALYSIS, ROUTINE W REFLEX MICROSCOPIC
Bilirubin Urine: NEGATIVE
GLUCOSE, UA: NEGATIVE mg/dL
HGB URINE DIPSTICK: NEGATIVE
Ketones, ur: NEGATIVE mg/dL
LEUKOCYTES UA: NEGATIVE
NITRITE: NEGATIVE
PH: 5 (ref 5.0–8.0)
PROTEIN: 100 mg/dL — AB
SPECIFIC GRAVITY, URINE: 1.014 (ref 1.005–1.030)

## 2016-12-07 LAB — CBC
HEMATOCRIT: 29.3 % — AB (ref 39.0–52.0)
Hemoglobin: 10.2 g/dL — ABNORMAL LOW (ref 13.0–17.0)
MCH: 38.5 pg — ABNORMAL HIGH (ref 26.0–34.0)
MCHC: 34.8 g/dL (ref 30.0–36.0)
MCV: 110.6 fL — ABNORMAL HIGH (ref 78.0–100.0)
PLATELETS: 149 10*3/uL — AB (ref 150–400)
RBC: 2.65 MIL/uL — ABNORMAL LOW (ref 4.22–5.81)
RDW: 15.3 % (ref 11.5–15.5)
WBC: 13 10*3/uL — AB (ref 4.0–10.5)

## 2016-12-07 LAB — BASIC METABOLIC PANEL
ANION GAP: 9 (ref 5–15)
BUN: 6 mg/dL (ref 6–20)
CALCIUM: 8.5 mg/dL — AB (ref 8.9–10.3)
CO2: 26 mmol/L (ref 22–32)
CREATININE: 0.94 mg/dL (ref 0.61–1.24)
Chloride: 95 mmol/L — ABNORMAL LOW (ref 101–111)
Glucose, Bld: 104 mg/dL — ABNORMAL HIGH (ref 65–99)
Potassium: 3.4 mmol/L — ABNORMAL LOW (ref 3.5–5.1)
SODIUM: 130 mmol/L — AB (ref 135–145)

## 2016-12-07 MED ORDER — POTASSIUM CHLORIDE CRYS ER 20 MEQ PO TBCR
40.0000 meq | EXTENDED_RELEASE_TABLET | Freq: Once | ORAL | Status: AC
  Administered 2016-12-07: 40 meq via ORAL
  Filled 2016-12-07: qty 2

## 2016-12-07 NOTE — Progress Notes (Signed)
Notified LCSW of patient's sister's decision for patient to go to [facility]. [place], Ardeth Sportsman

## 2016-12-07 NOTE — Progress Notes (Signed)
[facility]  PROGRESS NOTE  Shawn Hendrix DHR:[id number] DOB: 20-Jul-1951 DOA: 12/02/2016 PCP: No PCP Per Patient   Brief HPI:   66 y.o. gentleman with a history of HTN, gout, arthritis, prostate cancer, anemia, and daily EtOH use who presents to the ED via EMS after having multiple seizures at home.  He is a poor historian, but he thinks that his neighbor found him down and called 911.  Per the ED attending, EMS personnel witnessed at least one seizure, described as "grand mal", in the field.  The patient reports that he does not know when he had his last drink, but he suspects that it has been several days (at least).  No prior history of seizure.      Subjective   Patient seen and examined, No chest pain or shortness of breath. He is more clear mentally,   Assessment/Plan:     1. Seizure- due to alcohol withdrawal, patient was seen by neurology, EEG was done which showed diffuse slowing. Neurology has signed off. He does not need antiepileptic drugs at this time.No more seizures in the hospital.Patient is stable. 2. Alcohol withdrawal- patient became confused requiring additional doses of Ativan. Continue with CIWA protocol. He is currently not having any tremors, mentation is clear., Communicating without limitation 3. Fever- patient had MAXIMUM TEMPERATURE 101 yesterday, WBC elevated to 13,000.we will check chest x-ray, UA. 4. Hypomagnesemia- Replaced: magnesium  was 1.5, he was given magnesium sulfate 2 g IV 1. Repeat magnesium was  1.8. 5. Hypokalemia-potassium is 3.4 this morning, patient was given  K-Dur 40 mg by mouth 1 yesterday. Will repeat the dose of K-Dur and follow BMP in a.m. 6. Hypertension blood pressure is stable, continue Cardizem 240 mg by mouth daily, lisinopril 20 milligram by mouth daily 7. Anemia- likely chronic,  hemoglobin is 11.2, B12 435, folate 41.1, iron 68, TIBC 266, saturation 26%, ferritin 1003. Hemoglobin stable at 10.2 8. Prolonged QT interval  on EKG- patient's admission EKG showed QTC prolongation with interrupted 508. Likely due to electrolyte abnormalities, which is being  replaced. Continue monitoring on telemetry. 9. 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    DVT prophylaxis: SCD'd  Code Status: Full code  Family Communication: No family at bedside   Disposition Plan: Skilled nursing facility   Consultants:  Neurology  Procedures:  EEG-excess beta activity is mild diffuse slowing of the waking background.   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  Heart: Regular rate and rhythm, S1 and S2 normal, no murmurs, rubs auscultated Abdomen: BS normoactive,soft,nondistended,non-tender to palpation,no organomegaly Extremities: No pretibial edema, no erythema, no cyanosis, no clubbing Neuro : Alert and oriented to time, place and person, No focal deficits      Data Reviewed: I have personally reviewed following labs and imaging studies  CBG:  Recent Labs Lab 12/02/16 1627  GLUCAP 118*    CBC:  Recent Labs Lab 12/02/16 1630 12/03/16 0230 12/04/16 0240 12/07/16 0450  WBC 5.2 10.5 6.5 13.0*  NEUTROABS 4.2  --   4.5  --   HGB 7.6* 12.0* 11.2* 10.2*  HCT 22.9* 34.6* 32.8* 29.3*  MCV 115.1* 111.6* 110.8* 110.6*  PLT 56* 89* 98* 149*    Basic Metabolic Panel:  Recent Labs Lab 12/02/16 1900 12/03/16 0230 12/04/16 0240 12/05/16 0253 12/06/16 0244 12/07/16 0450  NA 136 137 133* 133* 133* 130*  K 3.0* 3.9 3.4* 3.1* 3.4* 3.4*  CL 97* 101 97* 97* 98* 95*  CO2 24 27 28 28 26 26   GLUCOSE 86 85 88 84 108* 104*  BUN <5* <5* 5* <5* <5* 6  CREATININE 0.97 0.80 0.76 0.69 0.65 0.94  CALCIUM 8.5* 8.0* 8.2* 8.4* 8.4* 8.5*  MG 1.3*  --  1.5* 1.6* 1.8  --     Recent Results (from the past 240 hour(s))  MRSA PCR Screening     Status: None   Collection Time: 12/02/16 11:04 PM  Result Value Ref Range Status   MRSA by PCR NEGATIVE NEGATIVE Final    Comment:        The GeneXpert MRSA Assay (FDA approved for NASAL specimens only), is one component of a comprehensive MRSA colonization surveillance program. It is not intended to diagnose MRSA infection nor to guide or monitor treatment for MRSA infections.      Liver Function Tests:  Recent Labs Lab 12/02/16 2250 12/04/16 0240 12/05/16 0253  AST 64* 50* 53*  ALT 22 19 23   ALKPHOS 68 55 61  BILITOT 3.8* 3.7* 2.0*  PROT 6.7 5.9* 6.5  ALBUMIN 3.4* 2.9* 3.1*      Studies: No results found.  Scheduled Meds: . diltiazem  240 mg Oral Daily  . feeding supplement  1 Container Oral TID BM  . ferrous sulfate  325 mg Oral Q breakfast  . folic acid  1 mg Oral Daily  . lisinopril  20 mg Oral Daily  . multivitamin  1 tablet Oral Daily  . pantoprazole  40 mg Oral Daily  . sodium chloride flush  3 mL Intravenous Q12H  . thiamine  100 mg Oral Daily      Time spent: 25 min  [facility] Pager [phone]. If 7PM-7AM, please contact night-coverage at [email or web address], Office  [phone]  password [email or web address] 12/07/2016, 3:00 PM  LOS: 4 days

## 2016-12-07 NOTE — Clinical Social Work Note (Signed)
Clinical Social Worker continuing to follow patient and family for support and discharge planning needs.  Patient sister has chosen [facility] spoke with Narda Rutherford at [facility] who is agreeable with patient admission once medically stable.  CSW remains available for support and to facilitate patient discharge needs once medically stable.  Barbette Or, LCSW (Weekend Coverage) [phone]

## 2016-12-08 DIAGNOSIS — R569 Unspecified convulsions: Secondary | ICD-10-CM | POA: Diagnosis not present

## 2016-12-08 DIAGNOSIS — R509 Fever, unspecified: Secondary | ICD-10-CM | POA: Diagnosis not present

## 2016-12-08 DIAGNOSIS — E876 Hypokalemia: Secondary | ICD-10-CM | POA: Diagnosis not present

## 2016-12-08 DIAGNOSIS — D649 Anemia, unspecified: Secondary | ICD-10-CM | POA: Diagnosis not present

## 2016-12-08 DIAGNOSIS — F10239 Alcohol dependence with withdrawal, unspecified: Secondary | ICD-10-CM | POA: Diagnosis not present

## 2016-12-08 DIAGNOSIS — F10230 Alcohol dependence with withdrawal, uncomplicated: Secondary | ICD-10-CM | POA: Diagnosis not present

## 2016-12-08 DIAGNOSIS — D696 Thrombocytopenia, unspecified: Secondary | ICD-10-CM | POA: Diagnosis not present

## 2016-12-08 LAB — BASIC METABOLIC PANEL
ANION GAP: 9 (ref 5–15)
BUN: 18 mg/dL (ref 6–20)
CALCIUM: 8.6 mg/dL — AB (ref 8.9–10.3)
CO2: 27 mmol/L (ref 22–32)
Chloride: 94 mmol/L — ABNORMAL LOW (ref 101–111)
Creatinine, Ser: 1.02 mg/dL (ref 0.61–1.24)
Glucose, Bld: 134 mg/dL — ABNORMAL HIGH (ref 65–99)
Potassium: 3.5 mmol/L (ref 3.5–5.1)
Sodium: 130 mmol/L — ABNORMAL LOW (ref 135–145)

## 2016-12-08 LAB — CBC
HEMATOCRIT: 27 % — AB (ref 39.0–52.0)
Hemoglobin: 9.5 g/dL — ABNORMAL LOW (ref 13.0–17.0)
MCH: 38.6 pg — ABNORMAL HIGH (ref 26.0–34.0)
MCHC: 35.2 g/dL (ref 30.0–36.0)
MCV: 109.8 fL — ABNORMAL HIGH (ref 78.0–100.0)
Platelets: 209 10*3/uL (ref 150–400)
RBC: 2.46 MIL/uL — ABNORMAL LOW (ref 4.22–5.81)
RDW: 15.3 % (ref 11.5–15.5)
WBC: 11.3 10*3/uL — AB (ref 4.0–10.5)

## 2016-12-08 MED ORDER — DILTIAZEM HCL ER COATED BEADS 240 MG PO CP24: 240.0000 mg | ORAL_CAPSULE | Freq: Every day | ORAL | Status: AC

## 2016-12-08 MED ORDER — PROSIGHT PO TABS
1.0000 | ORAL_TABLET | Freq: Every day | ORAL | 0 refills | Status: AC
Start: 2016-12-09 — End: ?

## 2016-12-08 MED ORDER — FOLIC ACID 1 MG PO TABS
1.0000 mg | ORAL_TABLET | Freq: Every day | ORAL | Status: AC
Start: 2016-12-09 — End: ?

## 2016-12-08 MED ORDER — NAPROXEN 250 MG PO TABS
375.0000 mg | ORAL_TABLET | Freq: Two times a day (BID) | ORAL | Status: DC
  Administered 2016-12-08: 375 mg via ORAL
  Filled 2016-12-08: qty 2

## 2016-12-08 MED ORDER — COLCHICINE 0.6 MG PO TABS
0.6000 mg | ORAL_TABLET | Freq: Two times a day (BID) | ORAL | Status: DC
  Administered 2016-12-08: 0.6 mg via ORAL
  Filled 2016-12-08: qty 1

## 2016-12-08 MED ORDER — ACETAMINOPHEN 325 MG PO TABS: 650.0000 mg | ORAL_TABLET | Freq: Four times a day (QID) | ORAL | Status: AC | PRN

## 2016-12-08 MED ORDER — BOOST / RESOURCE BREEZE PO LIQD: 1.0000 | Freq: Three times a day (TID) | ORAL | 0 refills | Status: AC

## 2016-12-08 MED ORDER — THIAMINE HCL 100 MG PO TABS: 100.0000 mg | ORAL_TABLET | Freq: Every day | ORAL | Status: AC

## 2016-12-08 MED ORDER — COLCHICINE 0.6 MG PO TABS: 0.6000 mg | ORAL_TABLET | Freq: Two times a day (BID) | ORAL | Status: DC

## 2016-12-08 MED ORDER — FERROUS SULFATE 325 (65 FE) MG PO TABS: 325.0000 mg | ORAL_TABLET | Freq: Every day | ORAL | 3 refills | Status: AC

## 2016-12-08 MED ORDER — LISINOPRIL 20 MG PO TABS: 20.0000 mg | ORAL_TABLET | Freq: Every day | ORAL | Status: AC

## 2016-12-08 NOTE — Discharge Summary (Signed)
Physician Discharge Summary  Shawn Hendrix TGG:[id number] DOB: [birth year] DOA: 12/02/2016  PCP: No PCP Per Patient  Admit date: 12/02/2016 Discharge date: 12/08/2016  Time spent: *25 minutes  Recommendations for Outpatient Follow-up:  1. Patient to be discharged to SNF for rehab 2. Continue Colchicine 0.6 mg po bid for Gout    Discharge Diagnoses:  Principal Problem:   Alcohol withdrawal seizure, uncomplicated (HCC) Active Problems:   Seizure ([place])   Anemia   Thrombocytopenia (HCC)   Hypokalemia   Malnutrition of moderate degree   Discharge Condition: Stable  Diet recommendation: Heart healthy diet  Filed Weights   12/02/16 2250  Weight: 50 kg (110 lb 3.2 oz)    History of present illness:  66 y.o.gentleman with a history of HTN, gout, arthritis, prostate cancer, anemia, and daily EtOH use who presents to the ED via EMS after having multiple seizures at home. He is a poor historian, but he thinks that his neighbor found him down and called 911. Per the ED attending, EMS personnel witnessed at least one seizure, described as "grand mal", in the field. The patient reports that he does not know when he had his last drink, but he suspects that it has been several days (at least). No prior history of seizure.    Hospital Course:  1. Seizure- due to alcohol withdrawal, patient was seen by neurology, EEG was done which showed diffuse slowing. Neurology has signed off. He does not need antiepileptic drugs at this time.No more seizures in the hospital.Patient is stable. 2. Alcohol withdrawal- patient became confused requiring additional doses of Ativan. Continue with CIWA protocol. He is currently not having any tremors, mentation is clear., Communicating without limitation 3. Fever- patient had MAXIMUM TEMPERATURE 101 yesterday, WBC elevated to 13,000. CXR was negative for pneumonia, UA was clear, likely from Gout.  4. Gout- patient has pain and warmth in right wrist. Will  give one dose of Naproxen and discharge on Cochicine 0.6 mg po bid. 5. Hypomagnesemia- Replaced: magnesium  was 1.5, he was given magnesium sulfate 2 g IV 1. Repeat magnesium was  1.8. 6. Hypokalemia-potassium is 3.5 this morning, patient was given  K-Dur 40 mg by mouth 1 yesterday. 7. Hypertension blood pressure is stable, continue Cardizem 240 mg by mouth daily, lisinopril 20 milligram by mouth daily. 8. Anemia- likely chronic, B12 435, folate 41.1, iron 68, TIBC 266, saturation 26%, ferritin 1003. Hemoglobin stable at 9.5 9. Prolonged QT interval on EKG- patient's admission EKG showed QTC prolongation with interrupted 508. Likely due to electrolyte abnormalities, which is being  replaced. Continue monitoring on telemetry. 10. 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 11.  Procedures:  None    Consultations:  None   Discharge Exam: Vitals:   12/08/16 0600 12/08/16 0857  BP:  104/60  Pulse:    Resp: 19   Temp:      General: Appears in no acute distress Cardiovascular: RRR, S1S2 Respiratory: Clear bilaterally Wrist- right wrist is tender to palpation, warm to touch  Discharge Instructions   Discharge Instructions    Diet - low sodium heart healthy    Complete by:  As directed    Increase activity slowly    Complete by:  As directed      Current Discharge Medication List    START taking these medications   Details  acetaminophen (TYLENOL) 325 MG tablet Take 2 tablets (650 mg total) by mouth every 6 (six) hours as needed for mild pain (or  Fever >/= 101).    colchicine 0.6 MG tablet Take 1 tablet (0.6 mg total) by mouth 2 (two) times daily.    diltiazem (CARDIZEM CD) 240 MG 24 hr capsule Take 1 capsule (240 mg total) by mouth daily.    feeding supplement (BOOST / RESOURCE BREEZE) LIQD Take 1 Container by mouth 3 (three) times daily between meals. Refills: 0    folic acid (FOLVITE) 1 MG tablet Take 1 tablet (1 mg total) by mouth daily.     multivitamin (PROSIGHT) TABS tablet Take 1 tablet by mouth daily. Qty: 30 each, Refills: 0    thiamine 100 MG tablet Take 1 tablet (100 mg total) by mouth daily.      CONTINUE these medications which have CHANGED   Details  ferrous sulfate 325 (65 FE) MG tablet Take 1 tablet (325 mg total) by mouth daily with breakfast. Refills: 3    lisinopril (PRINIVIL,ZESTRIL) 20 MG tablet Take 1 tablet (20 mg total) by mouth daily.      STOP taking these medications     Aspirin-Salicylamide-Caffeine (BC HEADACHE POWDER PO)      diltiazem (TIAZAC) 240 MG 24 hr capsule        No Known Allergies    The results of significant diagnostics from this hospitalization (including imaging, microbiology, ancillary and laboratory) are listed below for reference.    Significant Diagnostic Studies: Dg Chest 2 View  Result Date: 12/07/2016 CLINICAL DATA:  Fever today. EXAM: CHEST  2 VIEW COMPARISON:  None. FINDINGS: The lungs are clear. Heart size is normal. There is no pneumothorax or pleural effusion. No acute bony abnormality. Marked lower thoracic and upper lumbar degenerative change is noted. IMPRESSION: No acute disease. Electronically Signed   By: Inge Rise M.D.   On: 12/07/2016 16:22   Ct Head Wo Contrast  Result Date: 12/02/2016 CLINICAL DATA:  Acute onset of seizures.  Initial encounter. EXAM: CT HEAD WITHOUT CONTRAST TECHNIQUE: Contiguous axial images were obtained from the base of the skull through the vertex without intravenous contrast. COMPARISON:  None. FINDINGS: Brain: No evidence of acute infarction, hemorrhage, hydrocephalus, extra-axial collection or mass lesion/mass effect. Prominence of the ventricles and sulci reflects mild to moderate cortical volume loss. Cerebellar atrophy is noted. Scattered periventricular white matter change likely reflects small vessel ischemic microangiopathy. The brainstem and fourth ventricle are within normal limits. The basal ganglia are  unremarkable in appearance. The cerebral hemispheres demonstrate grossly normal gray-white differentiation. No mass effect or midline shift is seen. Vascular: No hyperdense vessel or unexpected calcification. Skull: There is no evidence of fracture; visualized osseous structures are unremarkable in appearance. Sinuses/Orbits: The visualized portions of the orbits are grossly unremarkable. A right optic globe prosthesis is noted. The paranasal sinuses and mastoid air cells are well-aerated. Other: No significant soft tissue abnormalities are seen. IMPRESSION: 1. No acute intracranial pathology seen on CT. 2. Mild to moderate cortical volume loss and scattered small vessel ischemic microangiopathy. Electronically Signed   By: Garald Balding M.D.   On: 12/02/2016 17:32    Microbiology: Recent Results (from the past 240 hour(s))  MRSA PCR Screening     Status: None   Collection Time: 12/02/16 11:04 PM  Result Value Ref Range Status   MRSA by PCR NEGATIVE NEGATIVE Final    Comment:        The GeneXpert MRSA Assay (FDA approved for NASAL specimens only), is one component of a comprehensive MRSA colonization surveillance program. It is not intended to diagnose MRSA  infection nor to guide or monitor treatment for MRSA infections.      Labs: Basic Metabolic Panel:  Recent Labs Lab 12/02/16 1900  12/04/16 0240 12/05/16 0253 12/06/16 0244 12/07/16 0450 12/08/16 0325  NA 136  < > 133* 133* 133* 130* 130*  K 3.0*  < > 3.4* 3.1* 3.4* 3.4* 3.5  CL 97*  < > 97* 97* 98* 95* 94*  CO2 24  < > 28 28 26 26 27   GLUCOSE 86  < > 88 84 108* 104* 134*  BUN <5*  < > 5* <5* <5* 6 18  CREATININE 0.97  < > 0.76 0.69 0.65 0.94 1.02  CALCIUM 8.5*  < > 8.2* 8.4* 8.4* 8.5* 8.6*  MG 1.3*  --  1.5* 1.6* 1.8  --   --   < > = values in this interval not displayed. Liver Function Tests:  Recent Labs Lab 12/02/16 2250 12/04/16 0240 12/05/16 0253  AST 64* 50* 53*  ALT 22 19 23   ALKPHOS 68 55 61  BILITOT  3.8* 3.7* 2.0*  PROT 6.7 5.9* 6.5  ALBUMIN 3.4* 2.9* 3.1*   No results for input(s): LIPASE, AMYLASE in the last 168 hours. No results for input(s): AMMONIA in the last 168 hours. CBC:  Recent Labs Lab 12/02/16 1630 12/03/16 0230 12/04/16 0240 12/07/16 0450 12/08/16 0325  WBC 5.2 10.5 6.5 13.0* 11.3*  NEUTROABS 4.2  --  4.5  --   --   HGB 7.6* 12.0* 11.2* 10.2* 9.5*  HCT 22.9* 34.6* 32.8* 29.3* 27.0*  MCV 115.1* 111.6* 110.8* 110.6* 109.8*  PLT 56* 89* 98* 149* 209    CBG:  Recent Labs Lab 12/02/16 1627  GLUCAP 118*       Signed:  Tarena Gockley S MD.  [facility] 12/08/2016, 11:32 AM

## 2016-12-08 NOTE — Progress Notes (Signed)
Report called and given to [employer].

## 2016-12-08 NOTE — Clinical Social Work Placement (Signed)
   CLINICAL SOCIAL WORK PLACEMENT  NOTE  Date:  12/08/2016  Patient Details  Name: Shawn Hendrix MRN: [id number] Date of Birth: November 22, 1950  Clinical Social Work is seeking post-discharge placement for this patient at the [place] level of care (*CSW will initial, date and re-position this form in  chart as items are completed):  Yes   Patient/family provided with [facility] Work Department's list of facilities offering this level of care within the geographic area requested by the patient (or if unable, by the patient's family).  Yes   Patient/family informed of their freedom to choose among providers that offer the needed level of care, that participate in Medicare, Medicaid or managed care program needed by the patient, have an available bed and are willing to accept the patient.  Yes   Patient/family informed of [facility]'s ownership interest in [facility] and [facility], as well as of the fact that they are under no obligation to receive care at these facilities.  PASRR submitted to EDS on 12/05/16     PASRR number received on 12/05/16     Existing PASRR number confirmed on       FL2 transmitted to all facilities in geographic area requested by pt/family on 12/05/16     FL2 transmitted to all facilities within larger geographic area on       Patient informed that his/her managed care company has contracts with or will negotiate with certain facilities, including the following:        Yes   Patient/family informed of bed offers received.  Patient chooses bed at [facility]     Physician recommends and patient chooses bed at      Patient to be transferred to [facility] on 12/08/16.  Patient to be transferred to facility by Ambulance     Patient family notified on 12/08/16 of transfer.  Name of family member notified:  Patient sister Inez Catalina at bedside     PHYSICIAN Please sign FL2      Additional Comment:   Barbette Or, [place]

## 2016-12-08 NOTE — Clinical Social Work Note (Signed)
Clinical Social Worker facilitated patient discharge including contacting patient family and facility to confirm patient discharge plans.  Clinical information faxed to facility and family agreeable with plan.  CSW arranged ambulance transport via [facility] to [facility] .  RN to call report prior to discharge.  Clinical Social Worker will sign off for now as social work intervention is no longer needed. Please consult us again if new need arises.  Jesse Nijae Doyel, LCSW [phone] 

## 2017-01-09 NOTE — Progress Notes (Signed)
Late entry due to missed g-code.   02-03-17 1600  OT G-codes **NOT FOR INPATIENT CLASS**  Functional Assessment Tool Used Clinical judgement  Functional Limitation Self care  Self Care Current Status [phone]) CM  Self Care Goal Status (T0354) CI  03-Feb-2017 Nestor Lewandowsky, OTR/L Pager: [phone]

## 2017-01-12 NOTE — Progress Notes (Signed)
Physical Therapy Note  (Late entry for G Code correction)    01/03/17 1200  PT G-Codes **NOT FOR INPATIENT CLASS**  Functional Assessment Tool Used Clinical judgement  Functional Limitation Mobility: Walking and moving around  Mobility: Walking and Moving Around Current Status (X6553) CK  Mobility: Walking and Moving Around Goal Status (Z4827) CI   Roney Marion, [place] Pager [phone] Office [phone]

## 2017-05-20 ENCOUNTER — Encounter: Payer: Self-pay | Admitting: Gastroenterology

## 2017-07-16 ENCOUNTER — Encounter: Payer: Self-pay | Admitting: Specialist

## 2017-07-29 ENCOUNTER — Encounter: Payer: Medicare Other | Admitting: Gastroenterology

## 2017-10-16 ENCOUNTER — Encounter: Payer: Self-pay | Admitting: Internal Medicine

## 2017-11-26 ENCOUNTER — Ambulatory Visit: Payer: Medicare Other | Admitting: Internal Medicine

## 2017-11-26 ENCOUNTER — Telehealth: Payer: Self-pay | Admitting: Internal Medicine

## 2017-11-26 NOTE — Telephone Encounter (Signed)
No charge. 

## 2017-12-01 ENCOUNTER — Other Ambulatory Visit: Payer: Self-pay

## 2017-12-01 ENCOUNTER — Emergency Department (HOSPITAL_COMMUNITY): Payer: Medicare Other

## 2017-12-01 ENCOUNTER — Ambulatory Visit (HOSPITAL_BASED_OUTPATIENT_CLINIC_OR_DEPARTMENT_OTHER)
Admission: EM | Admit: 2017-12-01 | Discharge: 2017-12-01 | Disposition: A | Discharge status: Home / Self Care | Payer: Medicare Other | Source: Home / Self Care | Attending: Emergency Medicine | Admitting: Emergency Medicine

## 2017-12-01 ENCOUNTER — Encounter (HOSPITAL_COMMUNITY): Payer: Self-pay

## 2017-12-01 ENCOUNTER — Emergency Department (HOSPITAL_COMMUNITY)
Admission: EM | Admit: 2017-12-01 | Discharge: 2017-12-01 | Disposition: A | Discharge status: Home / Self Care | Payer: Medicare Other | Attending: Emergency Medicine | Admitting: Emergency Medicine

## 2017-12-01 DIAGNOSIS — Z8546 Personal history of malignant neoplasm of prostate: Secondary | ICD-10-CM | POA: Insufficient documentation

## 2017-12-01 DIAGNOSIS — R0602 Shortness of breath: Secondary | ICD-10-CM | POA: Diagnosis not present

## 2017-12-01 DIAGNOSIS — Z79899 Other long term (current) drug therapy: Secondary | ICD-10-CM | POA: Diagnosis not present

## 2017-12-01 DIAGNOSIS — M25472 Effusion, left ankle: Secondary | ICD-10-CM | POA: Diagnosis not present

## 2017-12-01 DIAGNOSIS — R609 Edema, unspecified: Secondary | ICD-10-CM

## 2017-12-01 DIAGNOSIS — I10 Essential (primary) hypertension: Secondary | ICD-10-CM | POA: Diagnosis not present

## 2017-12-01 DIAGNOSIS — R2242 Localized swelling, mass and lump, left lower limb: Secondary | ICD-10-CM | POA: Diagnosis present

## 2017-12-01 LAB — BRAIN NATRIURETIC PEPTIDE: B NATRIURETIC PEPTIDE 5: 101.6 pg/mL — AB (ref 0.0–100.0)

## 2017-12-01 LAB — CBC
HCT: 28.2 % — ABNORMAL LOW (ref 39.0–52.0)
Hemoglobin: 9.6 g/dL — ABNORMAL LOW (ref 13.0–17.0)
MCH: 36.2 pg — AB (ref 26.0–34.0)
MCHC: 34 g/dL (ref 30.0–36.0)
MCV: 106.4 fL — ABNORMAL HIGH (ref 78.0–100.0)
PLATELETS: 464 10*3/uL — AB (ref 150–400)
RBC: 2.65 MIL/uL — AB (ref 4.22–5.81)
RDW: 14.7 % (ref 11.5–15.5)
WBC: 15 10*3/uL — AB (ref 4.0–10.5)

## 2017-12-01 LAB — BASIC METABOLIC PANEL
Anion gap: 15 (ref 5–15)
BUN: 10 mg/dL (ref 6–20)
CO2: 29 mmol/L (ref 22–32)
Calcium: 8.5 mg/dL — ABNORMAL LOW (ref 8.9–10.3)
Chloride: 87 mmol/L — ABNORMAL LOW (ref 101–111)
Creatinine, Ser: 0.88 mg/dL (ref 0.61–1.24)
Glucose, Bld: 134 mg/dL — ABNORMAL HIGH (ref 65–99)
POTASSIUM: 2.9 mmol/L — AB (ref 3.5–5.1)
SODIUM: 131 mmol/L — AB (ref 135–145)

## 2017-12-01 LAB — I-STAT TROPONIN, ED: TROPONIN I, POC: 0.02 ng/mL (ref 0.00–0.08)

## 2017-12-01 MED ORDER — POTASSIUM CHLORIDE CRYS ER 20 MEQ PO TBCR
40.0000 meq | EXTENDED_RELEASE_TABLET | Freq: Once | ORAL | Status: AC
Start: 2017-12-01 — End: 2017-12-01
  Administered 2017-12-01: 40 meq via ORAL
  Filled 2017-12-01: qty 2

## 2017-12-01 MED ORDER — TRAMADOL HCL 50 MG PO TABS: 50.0000 mg | ORAL_TABLET | Freq: Four times a day (QID) | ORAL | 0 refills | Status: DC | PRN

## 2017-12-01 MED ORDER — COLCHICINE 0.6 MG PO TABS: 0.6000 mg | ORAL_TABLET | Freq: Two times a day (BID) | ORAL | 0 refills | Status: DC

## 2017-12-01 NOTE — ED Provider Notes (Signed)
[facility] DEPT Provider Note   CSN: [id number] Arrival date & time: 12/01/17  0847     History   Chief Complaint Chief Complaint  Patient presents with  . Leg Swelling    HPI Shawn Hendrix is a 67 y.o. male.  HPI Patient presents to the emergency room for evaluation of leg swelling and shortness of breath.  Patient states for the last week and a half he has noticed some swelling and discomfort in the back of his left leg.  He has also had some shortness of breath.  Patient feels like that gets worse when he tries to walk around.  He denies any fevers or chills.  No chest pain.  No history of heart failure or DVT. Past Medical History:  Diagnosis Date  . Anemia   . Arthralgia   . Arthritis   . Gout   . Hypertension   . Prostate cancer ([place])   . Seizure [facility])     Patient Active Problem List   Diagnosis Date Noted  . Malnutrition of moderate degree 12/05/2016  . Seizure ([place]) 12/02/2016  . Alcohol withdrawal seizure, uncomplicated ([place]) 01/74/9449  . Anemia 12/02/2016  . Thrombocytopenia ([place]) 12/02/2016  . Hypokalemia 12/02/2016  . Malignant neoplasm of prostate ([place]) 10/31/2014    Past Surgical History:  Procedure Laterality Date  . EYE SURGERY     R eye removal  . PROSTATE BIOPSY          Home Medications    Prior to Admission medications   Medication Sig Start Date End Date Taking? Authorizing Provider  acetaminophen (TYLENOL) 325 MG tablet Take 2 tablets (650 mg total) by mouth every 6 (six) hours as needed for mild pain (or Fever >/= 101). 12/08/16  Yes Darrick Meigs, Marge Duncans, MD  diltiazem (CARDIZEM CD) 240 MG 24 hr capsule Take 1 capsule (240 mg total) by mouth daily. 12/09/16  Yes Oswald Hillock, MD  ferrous sulfate 325 (65 FE) MG tablet Take 1 tablet (325 mg total) by mouth daily with breakfast. 12/09/16  Yes Darrick Meigs, Marge Duncans, MD  folic acid (FOLVITE) 1 MG tablet Take 1 tablet (1 mg total) by mouth daily. 12/09/16  Yes Oswald Hillock, MD  gabapentin (NEURONTIN) 300 MG capsule Take 300 mg by mouth 3 (three) times daily. 10/25/17  Yes [provider]  lisinopril (PRINIVIL,ZESTRIL) 20 MG tablet Take 1 tablet (20 mg total) by mouth daily. 12/09/16  Yes Oswald Hillock, MD  multivitamin (PROSIGHT) TABS tablet Take 1 tablet by mouth daily. 12/09/16  Yes Oswald Hillock, MD  omeprazole (PRILOSEC) 20 MG capsule Take 20 mg by mouth daily. 11/04/17  Yes [provider]  thiamine 100 MG tablet Take 1 tablet (100 mg total) by mouth daily. 12/09/16  Yes Oswald Hillock, MD  colchicine 0.6 MG tablet Take 1 tablet (0.6 mg total) by mouth 2 (two) times daily. 12/01/17   Dorie Rank, MD  feeding supplement (BOOST / RESOURCE BREEZE) LIQD Take 1 Container by mouth 3 (three) times daily between meals. 12/08/16   Oswald Hillock, MD  traMADol (ULTRAM) 50 MG tablet Take 1 tablet (50 mg total) by mouth every 6 (six) hours as needed. 12/01/17   Dorie Rank, MD    Family History Family History  Problem Relation Age of Onset  . Cancer Sister   . Cancer Sister     Social History Social History   Tobacco Use  . Smoking status: Never Smoker  .  Smokeless tobacco: Never Used  Substance Use Topics  . Alcohol use: Yes    Comment: pint of alcohol daily  . Drug use: No     Allergies   Patient has no known allergies.   Review of Systems Review of Systems  All other systems reviewed and are negative.    Physical Exam Updated Vital Signs BP 123/78   Pulse 92   Temp 98.4 F (36.9 C) (Oral)   Resp 18   Ht 1.676 m (5\' 6" )   Wt 54.4 kg (120 lb)   SpO2 97%   BMI 19.37 kg/m   Physical Exam  Constitutional: He appears well-developed and well-nourished. No distress.  HENT:  Head: Normocephalic and atraumatic.  Right Ear: External ear normal.  Left Ear: External ear normal.  Eyes: Conjunctivae are normal. Right eye exhibits no discharge. Left eye exhibits no discharge. No scleral icterus.  Neck: Neck supple. No tracheal deviation  present.  Cardiovascular: Normal rate, regular rhythm and intact distal pulses.  Pulmonary/Chest: Effort normal and breath sounds normal. No stridor. No respiratory distress. He has no wheezes. He has no rales.  Abdominal: Soft. Bowel sounds are normal. He exhibits no distension. There is no tenderness. There is no rebound and no guarding.  Musculoskeletal: He exhibits tenderness. He exhibits no edema.       Left ankle: He exhibits swelling. Tenderness.       Left lower leg: He exhibits tenderness. He exhibits no swelling.  Neurological: He is alert. He has normal strength. No cranial nerve deficit (no facial droop, extraocular movements intact, no slurred speech) or sensory deficit. He exhibits normal muscle tone. He displays no seizure activity. Coordination normal.  Skin: Skin is warm and dry. No rash noted.  Psychiatric: He has a normal mood and affect.  Nursing note and vitals reviewed.    ED Treatments / Results  Labs (all labs ordered are listed, but only abnormal results are displayed) Labs Reviewed  CBC - Abnormal; Notable for the following components:      Result Value   WBC 15.0 (*)    RBC 2.65 (*)    Hemoglobin 9.6 (*)    HCT 28.2 (*)    MCV 106.4 (*)    MCH 36.2 (*)    Platelets 464 (*)    All other components within normal limits  BASIC METABOLIC PANEL - Abnormal; Notable for the following components:   Sodium 131 (*)    Potassium 2.9 (*)    Chloride 87 (*)    Glucose, Bld 134 (*)    Calcium 8.5 (*)    All other components within normal limits  BRAIN NATRIURETIC PEPTIDE - Abnormal; Notable for the following components:   B Natriuretic Peptide 101.6 (*)    All other components within normal limits  I-STAT TROPONIN, ED    EKG EKG Interpretation  Date/Time:  Monday December 01 2017 12:26:47 EDT Ventricular Rate:  99 PR Interval:    QRS Duration: 87 QT Interval:  371 QTC Calculation: 477 R Axis:   12 Text Interpretation:  Sinus rhythm Probable left atrial  enlargement ST elevation, consider inferior injury Borderline prolonged QT interval No significant change since last tracing Confirmed by Dorie Rank [phone]) on 12/01/2017 1:04:45 PM Also confirmed by Dorie Rank [id number]), editor Lynder Parents [phone])  on 12/01/2017 2:31:25 PM   Radiology Preliminary DVT report: No DVT  Procedures Procedures (including critical care time)  Medications Ordered in ED Medications  potassium chloride SA (K-DUR,KLOR-CON) CR tablet 40  mEq (40 mEq Oral Given 12/01/17 1501)     Initial Impression / Assessment and Plan / ED Course  I have reviewed the triage vital signs and the nursing notes.  Pertinent labs & imaging results that were available during my care of the patient were reviewed by me and considered in my medical decision making (see chart for details).  Clinical Course as of Dec 01 1621  Mon Dec 01, 2017  1421 Anemia is stable   [JK]  1421 Low will replace  Potassium(!): 2.9 [JK]    Clinical Course User Index [JK] Dorie Rank, MD    Patient presented to the emergency room for evaluation of ankle leg swelling.  Patient had some calf tenderness.  I was concerned about the possibility of DVT.  DVT study was negative.  Patient does have some history of gout.  I wonder if his leg pain is related to gout in his ankle.  Patient otherwise appears nontoxic.  Plan on discharge home with prescription for Ultram and colchicine.  Follow-up with his primary doctor. Final Clinical Impressions(s) / ED Diagnoses   Final diagnoses:  Left ankle swelling    ED Discharge Orders        Ordered    traMADol (ULTRAM) 50 MG tablet  Every 6 hours PRN     12/01/17 1616    colchicine 0.6 MG tablet  2 times daily,   Status:  Discontinued     12/01/17 1617    colchicine 0.6 MG tablet  2 times daily     12/01/17 1622       Dorie Rank, MD 12/01/17 1623

## 2017-12-01 NOTE — Discharge Instructions (Addendum)
Follow-up with your primary care doctor next week to be rechecked if the symptoms have not resolved, take medications as prescribed

## 2017-12-01 NOTE — ED Triage Notes (Addendum)
Patient has had left leg swelling and SOB with activity x 1 1/2 weeks. Left leg pain worse when ambulating.

## 2017-12-01 NOTE — ED Notes (Signed)
Pt is alert and oriented x 4 and is verbally responsive. Pt reports left knee swelling and noted let ankle swelling x 1 week. Pt reports that it worsens with ambulation. Pt denies falling or injuries to leg.

## 2017-12-01 NOTE — Progress Notes (Signed)
*  Preliminary Results* Left lower extremity venous duplex completed. Left lower extremity is negative for deep vein thrombosis. There is no evidence of left Baker's cyst.  Incidental finding: there is evidence of multiple heterogenous areas of the left groin, suggestive of possible prominent inguinal lymph nodes.  12/01/2017 3:44 PM  Maudry Mayhew, BS, RVT, RDCS, RDMS

## 2018-01-07 ENCOUNTER — Encounter: Payer: Self-pay | Admitting: Internal Medicine

## 2018-01-07 ENCOUNTER — Encounter (INDEPENDENT_AMBULATORY_CARE_PROVIDER_SITE_OTHER): Payer: Self-pay

## 2018-01-07 ENCOUNTER — Other Ambulatory Visit (INDEPENDENT_AMBULATORY_CARE_PROVIDER_SITE_OTHER): Payer: Medicare Other

## 2018-01-07 ENCOUNTER — Ambulatory Visit (INDEPENDENT_AMBULATORY_CARE_PROVIDER_SITE_OTHER): Payer: Medicare Other | Admitting: Internal Medicine

## 2018-01-07 VITALS — BP 140/72 | HR 99 | Ht 63.0 in | Wt 111.0 lb

## 2018-01-07 DIAGNOSIS — E876 Hypokalemia: Secondary | ICD-10-CM

## 2018-01-07 DIAGNOSIS — E871 Hypo-osmolality and hyponatremia: Secondary | ICD-10-CM | POA: Diagnosis not present

## 2018-01-07 DIAGNOSIS — R1084 Generalized abdominal pain: Secondary | ICD-10-CM

## 2018-01-07 DIAGNOSIS — R011 Cardiac murmur, unspecified: Secondary | ICD-10-CM

## 2018-01-07 DIAGNOSIS — R109 Unspecified abdominal pain: Secondary | ICD-10-CM | POA: Diagnosis not present

## 2018-01-07 LAB — COMPREHENSIVE METABOLIC PANEL
ALT: 7 U/L (ref 0–53)
AST: 17 U/L (ref 0–37)
Albumin: 3.9 g/dL (ref 3.5–5.2)
Alkaline Phosphatase: 102 U/L (ref 39–117)
BILIRUBIN TOTAL: 3.2 mg/dL — AB (ref 0.2–1.2)
BUN: 5 mg/dL — ABNORMAL LOW (ref 6–23)
CALCIUM: 9.3 mg/dL (ref 8.4–10.5)
CHLORIDE: 97 meq/L (ref 96–112)
CO2: 29 meq/L (ref 19–32)
Creatinine, Ser: 0.79 mg/dL (ref 0.40–1.50)
GFR: 103.69 mL/min (ref >60.00)
Glucose, Bld: 108 mg/dL — ABNORMAL HIGH (ref 70–99)
Potassium: 3.6 mEq/L (ref 3.5–5.1)
Sodium: 135 mEq/L (ref 135–145)
Total Protein: 7.9 g/dL (ref 6.0–8.3)

## 2018-01-07 LAB — CBC WITH DIFFERENTIAL/PLATELET
BASOS ABS: 0.1 10*3/uL (ref 0.0–0.1)
Basophils Relative: 1 % (ref 0.0–3.0)
Eosinophils Absolute: 0 10*3/uL (ref 0.0–0.7)
Eosinophils Relative: 0.4 % (ref 0.0–5.0)
HEMATOCRIT: 39.2 % (ref 39.0–52.0)
Hemoglobin: 12.9 g/dL — ABNORMAL LOW (ref 13.0–17.0)
LYMPHS PCT: 11.8 % — AB (ref 12.0–46.0)
Lymphs Abs: 0.9 10*3/uL (ref 0.7–4.0)
MCHC: 32.9 g/dL (ref 30.0–36.0)
MCV: 105.9 fl — AB (ref 78.0–100.0)
MONOS PCT: 16.6 % — AB (ref 3.0–12.0)
Monocytes Absolute: 1.3 10*3/uL — ABNORMAL HIGH (ref 0.1–1.0)
NEUTROS ABS: 5.3 10*3/uL (ref 1.4–7.7)
Neutrophils Relative %: 70.2 % (ref 43.0–77.0)
PLATELETS: 180 10*3/uL (ref 150.0–400.0)
RBC: 3.7 Mil/uL — ABNORMAL LOW (ref 4.22–5.81)
RDW: 19.5 % — ABNORMAL HIGH (ref 11.5–15.5)
WBC: 7.6 10*3/uL (ref 4.0–10.5)

## 2018-01-07 LAB — LIPASE: Lipase: 25 U/L (ref 11.0–59.0)

## 2018-01-07 NOTE — Patient Instructions (Signed)
Your provider has requested that you go to the basement level for lab work before leaving today. Press "B" on the elevator. The lab is located at the first door on the left as you exit the elevator.  You have been scheduled for a CT scan of the abdomen and pelvis at [facility] ([address]---this is in the same building as Press [facility]).   You are scheduled on 01/16/2018 at 2:30pm. You should arrive 15 minutes prior to your appointment time for registration. Please follow the written instructions below on the day of your exam:  WARNING: IF YOU ARE ALLERGIC TO IODINE/X-RAY DYE, PLEASE NOTIFY RADIOLOGY IMMEDIATELY AT [phone]! YOU WILL BE GIVEN A 13 HOUR PREMEDICATION PREP.  1) Do not eat anything after 10:30AM (4 hours prior to your test) 2) You have been given 2 bottles of oral contrast to drink. The solution may taste               better if refrigerated, but do NOT add ice or any other liquid to this solution. Shake             well before drinking.    Drink 1 bottle of contrast @ 12:30pm (2 hours prior to your exam)  Drink 1 bottle of contrast @ 1:30pm (1 hour prior to your exam)  You may take any medications as prescribed with a small amount of water except for the following: Metformin, Glucophage, Glucovance, Avandamet, Riomet, Fortamet, Actoplus Met, Janumet, Glumetza or Metaglip. The above medications must be held the day of the exam AND 48 hours after the exam.  The purpose of you drinking the oral contrast is to aid in the visualization of your intestinal tract. The contrast solution may cause some diarrhea. Before your exam is started, you will be given a small amount of fluid to drink. Depending on your individual set of symptoms, you may also receive an intravenous injection of x-ray contrast/dye. Plan on being at [facility] for 30 minutes or long, depending on the type of exam you are having performed.  If you have any questions regarding your exam or  if you need to reschedule, you may call the CT department at [phone] between the hours of 8:00 am and 5:00 pm, Monday-Friday.  You will be contacted regarding an appointment with cardiology.

## 2018-01-07 NOTE — Progress Notes (Signed)
HISTORY OF PRESENT ILLNESS:  Shawn Hendrix is a 67 y.o. male  Chronic alcoholic with hypertension, gout, and seizure disorder who is sent today by his primary care provider Dr. Alphonzo Grieve Butch Penny Odem PA-C) after an office visit February 2019 (reviewed) regarding abdominal pain. The patient is a poor historian. Patient tells me that he has had problems with abdominal pain off and on for "some time". He describes discomfort in the midabdomen which is short-lived improved after meals. He has had some weight loss. Previously hospitalized with alcohol withdrawal and seizures. Does take BC powders several times per month. Also Alka-Seltzer. He is on Prilosec 20 mg daily. We confirmed with his pharmacy that he has picked up and is taking his prescription. His brother helps him. The patient is essentially illiterate by his account. He takes BC powders and Alka-Seltzer several times per month. We did confirm with his pharmacy that he is taking his omeprazole (last picked up May 7). Patient denies heartburn or dysphagia. He denies melena or hematochezia. He denies a prior history of GI evaluations. He was seen in the emergency room 12/01/2017. Shortness of b Chest x-ray revealed no active cardiopulmonary disease. Multiple electrolyte abnormalities including sodium 131, potassium 2.9, glucose 134, BNP 101.6, hemoglobin 9.6, MCV 106.4. Normal troponin.  REVIEW OF SYSTEMS:  All non-GI ROS negative unless otherwise stated in the history of present illness except for visual change, shortness of breath, ankle swelling  Past Medical History:  Diagnosis Date  . Anemia   . Arthralgia   . Arthritis   . Gout   . Hypertension   . Prostate cancer ([place])   . Seizure [facility])     Past Surgical History:  Procedure Laterality Date  . EYE SURGERY     R eye removal  . PROSTATE BIOPSY      Social History ARTIE MCINTYRE  reports that he has never smoked. He has never used smokeless tobacco. He reports that he drinks  alcohol. He reports that he does not use drugs.  family history includes Cancer in his sister and sister.  No Known Allergies     PHYSICAL EXAMINATION: Vital signs: BP 140/72   Pulse 99   Ht _0  (1.6 m)   Wt 111 lb (50.3 kg)   BMI 19.66 kg/m   Constitutional: thin, disheveled, chronically ill-appearing, no acute distress. Question alcohol on breath Psychiatric: alert and oriented x3, cooperative. 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 extraocular movements intact, anicteric, conjunctiva pale Mouth: oral pharynx moist, no lesions. Tongue is coated. Poor dentition Neck: supple no lymphadenopathy Back: Kyphosis Cardiovascular: heart regular rate and rhythm, harsh 3/6 holosystolic murmur heard best at the left lower sternal border and apex Lungs: clear to auscultation bilaterally Abdomen: soft, scaphoid, nontender, nondistended, no obvious ascites, no peritoneal signs, normal bowel sounds, no organomegaly Rectal:omitted Extremities: no clubbing, cyanosis, or lower extremity edema bilaterally Skin: no lesions on visible extremities Neuro: No focal deficits. No asterixis.  ASSESSMENT:  #1. Reports of intermittent abdominal pain improved with meals. No problems recently. On PPI. May have had ulcer disease which has improved. #2. Weight loss. Suspect secondary to alcoholism. With reports of abdominal pain, rule out other pathology #3. Chronic alcoholism. Ongoing. Multiple electrolyte perturbations secondary to the same #4. Macrocytic anemia #5. Harsh heart murmur. Patient denies a history of heart murmur or cardiac evaluation. Remarkably hospitalization from last year reports no murmur present on physical exam   PLAN:  1. CBC, comprehensive metabolic panel, serum lipase,  ethanol level today 2. Contrast-enhanced CT scan of the abdomen and pelvis 3. Continue PPI 4. Schedule cardiac echo to evaluate heart murmur 5. Cardiology opinion regarding the following after echo  completed 6. Return to PCP for management of Electrolyte disturbances and further evaluation of macrocytic anemia 7. Wean completely off alcohol 8. GI follow-up to be determined  60 minutes spent face-to-face with the patient. Greater than 50% a time use for counseling regarding his myriad of problems as outlined and the management plan  A copy of this consultation note has been sent to Dr. Alphonzo Grieve

## 2018-01-08 LAB — ETHANOL: Ethanol: NEGATIVE %

## 2018-01-16 ENCOUNTER — Ambulatory Visit (INDEPENDENT_AMBULATORY_CARE_PROVIDER_SITE_OTHER)
Admission: RE | Admit: 2018-01-16 | Discharge: 2018-01-16 | Disposition: A | Discharge status: Home / Self Care | Payer: Medicare Other | Source: Ambulatory Visit | Attending: Internal Medicine | Admitting: Internal Medicine

## 2018-01-16 DIAGNOSIS — R1084 Generalized abdominal pain: Secondary | ICD-10-CM

## 2018-01-16 MED ORDER — IOPAMIDOL (ISOVUE-300) INJECTION 61%
100.0000 mL | Freq: Once | INTRAVENOUS | Status: AC | PRN
  Administered 2018-01-16: 100 mL via INTRAVENOUS

## 2018-01-21 ENCOUNTER — Other Ambulatory Visit: Payer: Self-pay

## 2018-01-21 ENCOUNTER — Telehealth: Payer: Self-pay

## 2018-01-21 DIAGNOSIS — R011 Cardiac murmur, unspecified: Secondary | ICD-10-CM

## 2018-01-21 NOTE — Telephone Encounter (Signed)
Put in ambulatory referral to Cardiology and referral in [place].  Spoke to [place] in cardiology and scheduled consultation with Rosaria Ferries, PA.  LM on VM

## 2018-02-02 NOTE — Telephone Encounter (Signed)
Cardiology  Appointment scheduled

## 2018-02-16 ENCOUNTER — Ambulatory Visit: Payer: Medicare Other | Admitting: Physician Assistant

## 2018-02-16 NOTE — Progress Notes (Deleted)
Cardiology Office Note   Date:  02/16/2018   ID:  Shawn Hendrix, DOB [birth year], MRN [id number]  PCP:  Shawn Docker, MD  Cardiologist:  Shawn Marker Jeslynn Hollander, PA-C   No chief complaint on file.   History of Present Illness: Shawn Hendrix is a 67 y.o. male with a history of HTN, gout, chronic ETOH use w/ withdrawal symptoms and electrolyte abnormalities, prostate CA, microcytic anemia, Sz  05/22 office visit with Shawn Hendrix regarding abd pain, takes Priolosec, takes [facility] powders and Alka Seltzer, harsh systolic murmur noted>>appt made  Shawn Hendrix presents for ***   Past Medical History:  Diagnosis Date  . Anemia   . Arthralgia   . Arthritis   . Gout   . Hypertension   . Prostate cancer ([place])   . Seizure [facility])     Past Surgical History:  Procedure Laterality Date  . EYE SURGERY     R eye removal  . PROSTATE BIOPSY      Current Outpatient Medications  Medication Sig Dispense Refill  . acetaminophen (TYLENOL) 325 MG tablet Take 2 tablets (650 mg total) by mouth every 6 (six) hours as needed for mild pain (or Fever >/= 101).    . colchicine 0.6 MG tablet Take 1 tablet (0.6 mg total) by mouth 2 (two) times daily. (Patient taking differently: Take 0.6 mg by mouth 2 (two) times daily as needed. ) 14 tablet 0  . diltiazem (CARDIZEM CD) 240 MG 24 hr capsule Take 1 capsule (240 mg total) by mouth daily.    . feeding supplement (BOOST / RESOURCE BREEZE) LIQD Take 1 Container by mouth 3 (three) times daily between meals.  0  . ferrous sulfate 325 (65 FE) MG tablet Take 1 tablet (325 mg total) by mouth daily with breakfast.  3  . folic acid (FOLVITE) 1 MG tablet Take 1 tablet (1 mg total) by mouth daily.    Marland Kitchen gabapentin (NEURONTIN) 300 MG capsule Take 300 mg by mouth 2 (two) times daily.   6  . lisinopril (PRINIVIL,ZESTRIL) 20 MG tablet Take 1 tablet (20 mg total) by mouth daily.    . multivitamin (PROSIGHT) TABS tablet Take 1 tablet by mouth daily. 30 each 0   . omeprazole (PRILOSEC) 20 MG capsule Take 20 mg by mouth daily.  4  . thiamine 100 MG tablet Take 1 tablet (100 mg total) by mouth daily.    . traMADol (ULTRAM) 50 MG tablet Take 1 tablet (50 mg total) by mouth every 6 (six) hours as needed. 15 tablet 0   No current facility-administered medications for this visit.     Allergies:   Patient has no known allergies.    Social History:  The patient  reports that he has never smoked. He has never used smokeless tobacco. He reports that he drinks alcohol. He reports that he does not use drugs.   Family History:  The patient's family history includes Cancer in his sister and sister.    ROS:  Please see the history of present illness. All other systems are reviewed and negative.    PHYSICAL EXAM: VS:  There were no vitals taken for this visit. , BMI There is no height or weight on file to calculate BMI. GEN: Well nourished, well developed, male in no acute distress  HEENT: normal for age  Neck: no JVD, no carotid bruit, no masses Cardiac: RRR; no murmur, no rubs, or gallops Respiratory:  clear to auscultation bilaterally,  normal work of breathing GI: soft, nontender, nondistended, + BS MS: no deformity or atrophy; no edema; distal pulses are 2+ in all 4 extremities   Skin: warm and dry, no rash Neuro:  Strength and sensation are intact Psych: euthymic mood, full affect   EKG:  EKG {ACTION; IS/IS SXJ:[id number]} ordered today. The ekg ordered today demonstrates ***   Recent Labs: 12/01/2017: B Natriuretic Peptide 101.6 01/07/2018: ALT 7; BUN 5; Creatinine, Ser 0.79; Hemoglobin 12.9; Platelets 180.0; Potassium 3.6; Sodium 135    Lipid Panel No results found for: CHOL, TRIG, HDL, CHOLHDL, VLDL, LDLCALC, LDLDIRECT   Wt Readings from Last 3 Encounters:  01/07/18 111 lb (50.3 kg)  12/01/17 120 lb (54.4 kg)  12/02/16 110 lb 3.2 oz (50 kg)     Other studies Reviewed: Additional studies/ records that were reviewed today include:  ***.  ASSESSMENT AND PLAN:  1.  ***   Current medicines are reviewed at length with the patient today.  The patient {ACTIONS; HAS/DOES NOT HAVE:19233} concerns regarding medicines.  The following changes have been made:  {PLAN; NO CHANGE:13088:s}  Labs/ tests ordered today include: *** No orders of the defined types were placed in this encounter.    Disposition:   FU with ***  Signed, Shawn Ferries, PA-C  02/16/2018 8:18 AM    [facility] Phone: [phone]; Fax: [phone]  This note was written with the assistance of speech recognition software. Please excuse any transcriptional errors.

## 2018-02-17 ENCOUNTER — Encounter: Payer: Self-pay | Admitting: *Deleted

## 2019-11-28 ENCOUNTER — Inpatient Hospital Stay (HOSPITAL_COMMUNITY)
Admission: EM | Admit: 2019-11-28 | Discharge: 2019-12-07 | DRG: 872 | Disposition: A | Discharge status: Skilled Nursing Facility | Payer: Medicare Other | Attending: Internal Medicine | Admitting: Internal Medicine

## 2019-11-28 ENCOUNTER — Emergency Department (HOSPITAL_COMMUNITY): Payer: Medicare Other

## 2019-11-28 ENCOUNTER — Encounter (HOSPITAL_COMMUNITY): Payer: Self-pay | Admitting: *Deleted

## 2019-11-28 ENCOUNTER — Observation Stay (HOSPITAL_COMMUNITY): Payer: Medicare Other

## 2019-11-28 ENCOUNTER — Other Ambulatory Visit: Payer: Self-pay

## 2019-11-28 DIAGNOSIS — D509 Iron deficiency anemia, unspecified: Secondary | ICD-10-CM | POA: Diagnosis not present

## 2019-11-28 DIAGNOSIS — R17 Unspecified jaundice: Secondary | ICD-10-CM | POA: Diagnosis not present

## 2019-11-28 DIAGNOSIS — R296 Repeated falls: Secondary | ICD-10-CM | POA: Diagnosis not present

## 2019-11-28 DIAGNOSIS — R5381 Other malaise: Secondary | ICD-10-CM | POA: Diagnosis not present

## 2019-11-28 DIAGNOSIS — R52 Pain, unspecified: Secondary | ICD-10-CM

## 2019-11-28 DIAGNOSIS — M7989 Other specified soft tissue disorders: Secondary | ICD-10-CM | POA: Diagnosis present

## 2019-11-28 DIAGNOSIS — E876 Hypokalemia: Secondary | ICD-10-CM | POA: Diagnosis not present

## 2019-11-28 DIAGNOSIS — F1020 Alcohol dependence, uncomplicated: Secondary | ICD-10-CM | POA: Diagnosis not present

## 2019-11-28 DIAGNOSIS — R627 Adult failure to thrive: Secondary | ICD-10-CM | POA: Diagnosis present

## 2019-11-28 DIAGNOSIS — K529 Noninfective gastroenteritis and colitis, unspecified: Secondary | ICD-10-CM | POA: Diagnosis present

## 2019-11-28 DIAGNOSIS — M79642 Pain in left hand: Secondary | ICD-10-CM | POA: Diagnosis not present

## 2019-11-28 DIAGNOSIS — A419 Sepsis, unspecified organism: Secondary | ICD-10-CM | POA: Diagnosis not present

## 2019-11-28 DIAGNOSIS — D539 Nutritional anemia, unspecified: Secondary | ICD-10-CM | POA: Diagnosis not present

## 2019-11-28 DIAGNOSIS — E871 Hypo-osmolality and hyponatremia: Secondary | ICD-10-CM | POA: Diagnosis present

## 2019-11-28 DIAGNOSIS — H18412 Arcus senilis, left eye: Secondary | ICD-10-CM | POA: Diagnosis present

## 2019-11-28 DIAGNOSIS — E538 Deficiency of other specified B group vitamins: Secondary | ICD-10-CM | POA: Diagnosis present

## 2019-11-28 DIAGNOSIS — F1010 Alcohol abuse, uncomplicated: Secondary | ICD-10-CM | POA: Diagnosis present

## 2019-11-28 DIAGNOSIS — R531 Weakness: Secondary | ICD-10-CM | POA: Diagnosis present

## 2019-11-28 DIAGNOSIS — M1712 Unilateral primary osteoarthritis, left knee: Secondary | ICD-10-CM | POA: Diagnosis present

## 2019-11-28 DIAGNOSIS — Z79899 Other long term (current) drug therapy: Secondary | ICD-10-CM

## 2019-11-28 DIAGNOSIS — I340 Nonrheumatic mitral (valve) insufficiency: Secondary | ICD-10-CM | POA: Diagnosis present

## 2019-11-28 DIAGNOSIS — I371 Nonrheumatic pulmonary valve insufficiency: Secondary | ICD-10-CM | POA: Diagnosis present

## 2019-11-28 DIAGNOSIS — R569 Unspecified convulsions: Secondary | ICD-10-CM | POA: Diagnosis present

## 2019-11-28 DIAGNOSIS — M25562 Pain in left knee: Secondary | ICD-10-CM

## 2019-11-28 DIAGNOSIS — E878 Other disorders of electrolyte and fluid balance, not elsewhere classified: Secondary | ICD-10-CM | POA: Diagnosis present

## 2019-11-28 DIAGNOSIS — Z8546 Personal history of malignant neoplasm of prostate: Secondary | ICD-10-CM | POA: Diagnosis not present

## 2019-11-28 DIAGNOSIS — Z9001 Acquired absence of eye: Secondary | ICD-10-CM

## 2019-11-28 DIAGNOSIS — M109 Gout, unspecified: Secondary | ICD-10-CM | POA: Diagnosis not present

## 2019-11-28 DIAGNOSIS — Z20822 Contact with and (suspected) exposure to covid-19: Secondary | ICD-10-CM | POA: Diagnosis not present

## 2019-11-28 DIAGNOSIS — I10 Essential (primary) hypertension: Secondary | ICD-10-CM

## 2019-11-28 DIAGNOSIS — Z6823 Body mass index (BMI) 23.0-23.9, adult: Secondary | ICD-10-CM

## 2019-11-28 LAB — CBC WITH DIFFERENTIAL/PLATELET
Abs Immature Granulocytes: 0.21 10*3/uL — ABNORMAL HIGH (ref 0.00–0.07)
Basophils Absolute: 0 10*3/uL (ref 0.0–0.1)
Basophils Relative: 0 %
Eosinophils Absolute: 0 10*3/uL (ref 0.0–0.5)
Eosinophils Relative: 0 %
HCT: 30.2 % — ABNORMAL LOW (ref 39.0–52.0)
Hemoglobin: 10.4 g/dL — ABNORMAL LOW (ref 13.0–17.0)
Immature Granulocytes: 2 %
Lymphocytes Relative: 3 %
Lymphs Abs: 0.4 10*3/uL — ABNORMAL LOW (ref 0.7–4.0)
MCH: 38.4 pg — ABNORMAL HIGH (ref 26.0–34.0)
MCHC: 34.4 g/dL (ref 30.0–36.0)
MCV: 111.4 fL — ABNORMAL HIGH (ref 80.0–100.0)
Monocytes Absolute: 1.9 10*3/uL — ABNORMAL HIGH (ref 0.1–1.0)
Monocytes Relative: 14 %
Neutro Abs: 10.7 10*3/uL — ABNORMAL HIGH (ref 1.7–7.7)
Neutrophils Relative %: 81 %
Platelets: 255 10*3/uL (ref 150–400)
RBC: 2.71 MIL/uL — ABNORMAL LOW (ref 4.22–5.81)
RDW: 14 % (ref 11.5–15.5)
WBC: 13.2 10*3/uL — ABNORMAL HIGH (ref 4.0–10.5)
nRBC: 0 % (ref 0.0–0.2)

## 2019-11-28 LAB — RAPID URINE DRUG SCREEN, HOSP PERFORMED
Amphetamines: NOT DETECTED
Barbiturates: NOT DETECTED
Benzodiazepines: NOT DETECTED
Cocaine: NOT DETECTED
Opiates: NOT DETECTED
Tetrahydrocannabinol: NOT DETECTED

## 2019-11-28 LAB — VITAMIN B12: Vitamin B-12: 62 pg/mL — ABNORMAL LOW (ref 180–914)

## 2019-11-28 LAB — RETICULOCYTES
Immature Retic Fract: 13.3 % (ref 2.3–15.9)
RBC.: 2.19 MIL/uL — ABNORMAL LOW (ref 4.22–5.81)
Retic Count, Absolute: 82.8 10*3/uL (ref 19.0–186.0)
Retic Ct Pct: 3.8 % — ABNORMAL HIGH (ref 0.4–3.1)

## 2019-11-28 LAB — URINALYSIS, ROUTINE W REFLEX MICROSCOPIC
Bacteria, UA: NONE SEEN
Bilirubin Urine: NEGATIVE
Glucose, UA: NEGATIVE mg/dL
Ketones, ur: NEGATIVE mg/dL
Leukocytes,Ua: NEGATIVE
Nitrite: NEGATIVE
Protein, ur: 30 mg/dL — AB
Specific Gravity, Urine: 1.017 (ref 1.005–1.030)
pH: 5 (ref 5.0–8.0)

## 2019-11-28 LAB — COMPREHENSIVE METABOLIC PANEL
ALT: 13 U/L (ref 0–44)
AST: 25 U/L (ref 15–41)
Albumin: 2.9 g/dL — ABNORMAL LOW (ref 3.5–5.0)
Alkaline Phosphatase: 57 U/L (ref 38–126)
Anion gap: 12 (ref 5–15)
BUN: 26 mg/dL — ABNORMAL HIGH (ref 8–23)
CO2: 30 mmol/L (ref 22–32)
Calcium: 9.1 mg/dL (ref 8.9–10.3)
Chloride: 87 mmol/L — ABNORMAL LOW (ref 98–111)
Creatinine, Ser: 1.13 mg/dL (ref 0.61–1.24)
GFR calc Af Amer: >60 mL/min (ref >60)
GFR calc non Af Amer: >60 mL/min (ref >60)
Glucose, Bld: 133 mg/dL — ABNORMAL HIGH (ref 70–99)
Potassium: 3.9 mmol/L (ref 3.5–5.1)
Sodium: 129 mmol/L — ABNORMAL LOW (ref 135–145)
Total Bilirubin: 1.5 mg/dL — ABNORMAL HIGH (ref 0.3–1.2)
Total Protein: 7.5 g/dL (ref 6.5–8.1)

## 2019-11-28 LAB — IRON AND TIBC
Iron: 18 ug/dL — ABNORMAL LOW (ref 45–182)
Saturation Ratios: 9 % — ABNORMAL LOW (ref 17.9–39.5)
TIBC: 192 ug/dL — ABNORMAL LOW (ref 250–450)
UIBC: 174 ug/dL

## 2019-11-28 LAB — FERRITIN: Ferritin: 1056 ng/mL — ABNORMAL HIGH (ref 24–336)

## 2019-11-28 LAB — FOLATE: Folate: 5.6 ng/mL — ABNORMAL LOW (ref >5.9)

## 2019-11-28 LAB — TSH: TSH: 0.515 u[IU]/mL (ref 0.350–4.500)

## 2019-11-28 LAB — LIPASE, BLOOD: Lipase: 39 U/L (ref 11–51)

## 2019-11-28 LAB — ETHANOL: Alcohol, Ethyl (B): <10 mg/dL (ref <10)

## 2019-11-28 LAB — URIC ACID: Uric Acid, Serum: 8.6 mg/dL (ref 3.7–8.6)

## 2019-11-28 LAB — SARS CORONAVIRUS 2 (TAT 6-24 HRS): SARS Coronavirus 2: NEGATIVE

## 2019-11-28 MED ORDER — DILTIAZEM HCL ER COATED BEADS 240 MG PO CP24
240.0000 mg | ORAL_CAPSULE | Freq: Every day | ORAL | Status: DC
  Administered 2019-11-28 – 2019-12-07 (×10): 240 mg via ORAL
  Filled 2019-11-28 (×10): qty 1

## 2019-11-28 MED ORDER — THIAMINE HCL 100 MG/ML IJ SOLN: 100.0000 mg | Freq: Every day | INTRAMUSCULAR | Status: DC

## 2019-11-28 MED ORDER — LORAZEPAM 2 MG/ML IJ SOLN
0.0000 mg | Freq: Two times a day (BID) | INTRAMUSCULAR | Status: AC
  Filled 2019-11-28: qty 1

## 2019-11-28 MED ORDER — SODIUM CHLORIDE 0.9 % IV BOLUS
500.0000 mL | Freq: Once | INTRAVENOUS | Status: AC
  Administered 2019-11-28: 12:00:00 500 mL via INTRAVENOUS

## 2019-11-28 MED ORDER — ENSURE ENLIVE PO LIQD
Freq: Three times a day (TID) | ORAL | Status: DC
  Administered 2019-11-29 – 2019-12-06 (×11): 237 mL via ORAL

## 2019-11-28 MED ORDER — SODIUM CHLORIDE 0.9 % IV SOLN: INTRAVENOUS | Status: DC

## 2019-11-28 MED ORDER — TRAMADOL HCL 50 MG PO TABS: 50.0000 mg | ORAL_TABLET | Freq: Four times a day (QID) | ORAL | Status: DC | PRN

## 2019-11-28 MED ORDER — FOLIC ACID 1 MG PO TABS
1.0000 mg | ORAL_TABLET | Freq: Every day | ORAL | Status: DC
  Administered 2019-11-29 – 2019-12-07 (×9): 1 mg via ORAL
  Filled 2019-11-28 (×9): qty 1

## 2019-11-28 MED ORDER — ACETAMINOPHEN 325 MG PO TABS
650.0000 mg | ORAL_TABLET | Freq: Four times a day (QID) | ORAL | Status: DC | PRN
  Administered 2019-11-28 – 2019-11-30 (×3): 650 mg via ORAL
  Filled 2019-11-28 (×3): qty 2

## 2019-11-28 MED ORDER — POLYETHYLENE GLYCOL 3350 17 G PO PACK: 17.0000 g | PACK | Freq: Every day | ORAL | Status: DC | PRN

## 2019-11-28 MED ORDER — BISACODYL 10 MG RE SUPP: 10.0000 mg | Freq: Every day | RECTAL | Status: DC | PRN

## 2019-11-28 MED ORDER — ONDANSETRON HCL 4 MG PO TABS: 4.0000 mg | ORAL_TABLET | Freq: Four times a day (QID) | ORAL | Status: DC | PRN

## 2019-11-28 MED ORDER — LORAZEPAM 2 MG/ML IJ SOLN
0.0000 mg | Freq: Four times a day (QID) | INTRAMUSCULAR | Status: AC
  Administered 2019-11-30: 2 mg via INTRAVENOUS
  Filled 2019-11-28: qty 1

## 2019-11-28 MED ORDER — SODIUM CHLORIDE 0.9 % IV BOLUS
1000.0000 mL | Freq: Once | INTRAVENOUS | Status: AC
  Administered 2019-11-28: 1000 mL via INTRAVENOUS

## 2019-11-28 MED ORDER — LORAZEPAM 1 MG PO TABS: 0.0000 mg | ORAL_TABLET | Freq: Four times a day (QID) | ORAL | Status: AC

## 2019-11-28 MED ORDER — PANTOPRAZOLE SODIUM 40 MG PO TBEC
40.0000 mg | DELAYED_RELEASE_TABLET | Freq: Every day | ORAL | Status: DC
  Administered 2019-11-29 – 2019-12-07 (×9): 40 mg via ORAL
  Filled 2019-11-28 (×9): qty 1

## 2019-11-28 MED ORDER — FERROUS SULFATE 325 (65 FE) MG PO TABS
325.0000 mg | ORAL_TABLET | Freq: Every day | ORAL | Status: DC
  Administered 2019-11-29 – 2019-12-07 (×9): 325 mg via ORAL
  Filled 2019-11-28 (×9): qty 1

## 2019-11-28 MED ORDER — ONDANSETRON HCL 4 MG/2ML IJ SOLN: 4.0000 mg | Freq: Four times a day (QID) | INTRAMUSCULAR | Status: DC | PRN

## 2019-11-28 MED ORDER — HEPARIN SODIUM (PORCINE) 5000 UNIT/ML IJ SOLN
5000.0000 [IU] | Freq: Three times a day (TID) | INTRAMUSCULAR | Status: DC
  Administered 2019-11-28 – 2019-12-07 (×26): 5000 [IU] via SUBCUTANEOUS
  Filled 2019-11-28 (×27): qty 1

## 2019-11-28 MED ORDER — THIAMINE HCL 100 MG PO TABS
100.0000 mg | ORAL_TABLET | Freq: Every day | ORAL | Status: DC
  Administered 2019-11-29 – 2019-12-07 (×9): 100 mg via ORAL
  Filled 2019-11-28 (×9): qty 1

## 2019-11-28 MED ORDER — COLCHICINE 0.6 MG PO TABS
0.6000 mg | ORAL_TABLET | Freq: Two times a day (BID) | ORAL | Status: DC
  Administered 2019-11-28 – 2019-12-05 (×14): 0.6 mg via ORAL
  Filled 2019-11-28 (×14): qty 1

## 2019-11-28 MED ORDER — PROSIGHT PO TABS
1.0000 | ORAL_TABLET | Freq: Every day | ORAL | Status: DC
  Administered 2019-11-29 – 2019-12-07 (×9): 1 via ORAL
  Filled 2019-11-28 (×9): qty 1

## 2019-11-28 MED ORDER — LORAZEPAM 1 MG PO TABS: 0.0000 mg | ORAL_TABLET | Freq: Two times a day (BID) | ORAL | Status: AC

## 2019-11-28 NOTE — ED Notes (Signed)
Bed bugs observed; RN notified

## 2019-11-28 NOTE — ED Provider Notes (Signed)
[place] DEPT Provider Note   CSN: [id number] Arrival date & time: 11/28/19  1010     History Chief Complaint  Patient presents with  . Foot Swelling  . Arm Swelling    hands    Shawn Hendrix is a 69 y.o. male.  The history is provided by the patient and a relative (Niece, Rachael Fee).        Shawn Hendrix is a 69 y.o. male, with a history of anemia, alcohol use, gout, HTN, alcohol withdrawal seizures, prostate cancer, presenting to the ED with lower extremity weakness for the past week. After more questioning, he does admit his left leg is quite a bit weaker than the right, however, he attributes this to left knee pain and states, "I thought it might just be my gout." He states that due to his weakness, he has been mostly sitting in a chair at home.  He has not been able to get up and care for himself.  He has not been eating very well. Patient is typically a daily alcohol user.  He states he drinks a full bottle of wine daily, however, due to his weakness his last alcohol was 6 days ago.  Patient is accompanied at the bedside by his niece, Rachael Fee, who states she is concerned because the patient is usually able to get up, walk around, and generally care for himself.  Denies fever/chills, syncope, chest pain, shortness of breath, cough, abdominal pain, urinary symptoms, difficulty speaking, N/V/D, falls/trauma, lower extremity edema/pain, seizures, or any other complaints.    Past Medical History:  Diagnosis Date  . Anemia   . Arthralgia   . Arthritis   . Gout   . Hypertension   . Prostate cancer ([place])   . Seizure [facility])     Patient Active Problem List   Diagnosis Date Noted  . Generalized weakness 11/28/2019  . Malnutrition of moderate degree 12/05/2016  . Seizure ([place]) 12/02/2016  . Alcohol withdrawal seizure, uncomplicated ([place]) [id number]  . Anemia 12/02/2016  . Thrombocytopenia ([place]) 12/02/2016  . Hypokalemia 12/02/2016    . Malignant neoplasm of prostate ([place]) 10/31/2014    Past Surgical History:  Procedure Laterality Date  . EYE SURGERY     R eye removal  . PROSTATE BIOPSY         Family History  Problem Relation Age of Onset  . Cancer Sister   . Cancer Sister     Social History   Tobacco Use  . Smoking status: Never Smoker  . Smokeless tobacco: Never Used  Substance Use Topics  . Alcohol use: Yes    Alcohol/week: 56.0 standard drinks    Types: 56 Standard drinks or equivalent per week    Comment: pint of alcohol daily  . Drug use: No    Home Medications Prior to Admission medications   Medication Sig Start Date End Date Taking? Authorizing Provider  acetaminophen (TYLENOL) 325 MG tablet Take 2 tablets (650 mg total) by mouth every 6 (six) hours as needed for mild pain (or Fever >/= 101). 12/08/16   Oswald Hillock, MD  colchicine 0.6 MG tablet Take 1 tablet (0.6 mg total) by mouth 2 (two) times daily. Patient taking differently: Take 0.6 mg by mouth 2 (two) times daily as needed.  12/01/17   Dorie Rank, MD  diltiazem (CARDIZEM CD) 240 MG 24 hr capsule Take 1 capsule (240 mg total) by mouth daily. 12/09/16   Oswald Hillock, MD  feeding supplement (BOOST /  RESOURCE BREEZE) LIQD Take 1 Container by mouth 3 (three) times daily between meals. 12/08/16   Oswald Hillock, MD  ferrous sulfate 325 (65 FE) MG tablet Take 1 tablet (325 mg total) by mouth daily with breakfast. 12/09/16   Oswald Hillock, MD  folic acid (FOLVITE) 1 MG tablet Take 1 tablet (1 mg total) by mouth daily. 12/09/16   Oswald Hillock, MD  gabapentin (NEURONTIN) 300 MG capsule Take 300 mg by mouth 2 (two) times daily.  10/25/17   [provider]  lisinopril (PRINIVIL,ZESTRIL) 20 MG tablet Take 1 tablet (20 mg total) by mouth daily. 12/09/16   Oswald Hillock, MD  multivitamin (PROSIGHT) TABS tablet Take 1 tablet by mouth daily. 12/09/16   Oswald Hillock, MD  omeprazole (PRILOSEC) 20 MG capsule Take 20 mg by mouth daily. 11/04/17    [provider]  thiamine 100 MG tablet Take 1 tablet (100 mg total) by mouth daily. 12/09/16   Oswald Hillock, MD  traMADol (ULTRAM) 50 MG tablet Take 1 tablet (50 mg total) by mouth every 6 (six) hours as needed. 12/01/17   Dorie Rank, MD    Allergies    Patient has no known allergies.  Review of Systems   Review of Systems  Constitutional: Negative for chills and fever.  Eyes: Negative for visual disturbance.  Respiratory: Negative for cough and shortness of breath.   Cardiovascular: Negative for chest pain and leg swelling.  Gastrointestinal: Negative for abdominal pain, blood in stool, constipation, diarrhea, nausea and vomiting.  Genitourinary: Negative for difficulty urinating, dysuria, flank pain and hematuria.  Musculoskeletal: Negative for back pain and neck pain.  Neurological: Positive for weakness. Negative for dizziness, syncope, light-headedness, numbness and headaches.  Psychiatric/Behavioral: Negative for confusion.  All other systems reviewed and are negative.   Physical Exam Updated Vital Signs BP (!) 141/99 (BP Location: Right Arm)   Pulse (!) 102   Temp 99.7 F (37.6 C) (Oral)   Resp 16   Ht 5\' 1"  (1.549 m)   Wt 54.4 kg   SpO2 99%   BMI 22.67 kg/m   Physical Exam Vitals and nursing note reviewed.  Constitutional:      General: He is not in acute distress.    Appearance: He is not diaphoretic.     Comments: Disheveled appearance.  He is noted to have small bugs crawling in his clothing, possibly bedbugs.  HENT:     Head: Normocephalic and atraumatic.     Mouth/Throat:     Mouth: Mucous membranes are dry.     Pharynx: Oropharynx is clear.  Eyes:     Conjunctiva/sclera: Conjunctivae normal.     Comments: Right eyelid closed with discharge coming from the eye.  He states this eye has been this way for several years.  No acute changes.  Cardiovascular:     Rate and Rhythm: Normal rate and regular rhythm.     Pulses: Normal pulses.           Radial pulses are 2+ on the right side and 2+ on the left side.       Posterior tibial pulses are 2+ on the right side and 2+ on the left side.     Heart sounds: Normal heart sounds.     Comments: Tactile temperature in the extremities appropriate and equal bilaterally. Pulmonary:     Effort: Pulmonary effort is normal. No respiratory distress.     Breath sounds: Normal breath sounds.  Abdominal:  Palpations: Abdomen is soft.     Tenderness: There is no abdominal tenderness. There is no guarding.  Musculoskeletal:     Cervical back: Neck supple.     Right lower leg: No edema.     Left lower leg: No edema.     Comments: Patient has pain to the left knee with movement of the joint and it seems as though he has some stiffness in the joint, however, no swelling, deformity, increased warmth, erythema noted to the joint.  Palpation with some generalized tenderness, but no exquisite tenderness and no pain out of proportion. No tenderness, pain with range of motion, swelling, deformity, or other abnormalities noted to the other extremities upon inspection.  No midline spinal tenderness.   Lymphadenopathy:     Cervical: No cervical adenopathy.  Skin:    General: Skin is warm and dry.  Neurological:     Mental Status: He is alert and oriented to person, place, and time.     Comments: No noted acute cognitive deficit. Sensation grossly intact to light touch in the extremities.   Grip strength weaker on the left. Arm drift on the left. Strength 3/5 in the left arm and 2/5 in the left leg. Strength appears to be 4/5 on the right arm and right leg.  Cranial nerves III-XII grossly intact. Handles oral secretions without noted difficulty.  No noted phonation or speech deficit. No noted facial droop.  Psychiatric:        Mood and Affect: Mood and affect normal.        Speech: Speech normal.        Behavior: Behavior normal.     ED Results / Procedures / Treatments   Labs (all labs  ordered are listed, but only abnormal results are displayed) Labs Reviewed  COMPREHENSIVE METABOLIC PANEL - Abnormal; Notable for the following components:      Result Value   Sodium 129 (*)    Chloride 87 (*)    Glucose, Bld 133 (*)    BUN 26 (*)    Albumin 2.9 (*)    Total Bilirubin 1.5 (*)    All other components within normal limits  CBC WITH DIFFERENTIAL/PLATELET - Abnormal; Notable for the following components:   WBC 13.2 (*)    RBC 2.71 (*)    Hemoglobin 10.4 (*)    HCT 30.2 (*)    MCV 111.4 (*)    MCH 38.4 (*)    Neutro Abs 10.7 (*)    Lymphs Abs 0.4 (*)    Monocytes Absolute 1.9 (*)    Abs Immature Granulocytes 0.21 (*)    All other components within normal limits  URINALYSIS, ROUTINE W REFLEX MICROSCOPIC - Abnormal; Notable for the following components:   Hgb urine dipstick SMALL (*)    Protein, ur 30 (*)    All other components within normal limits  URINE CULTURE  SARS CORONAVIRUS 2 (TAT 6-24 HRS)  CULTURE, BLOOD (ROUTINE X 2)  CULTURE, BLOOD (ROUTINE X 2)  RAPID URINE DRUG SCREEN, HOSP PERFORMED  ETHANOL  LIPASE, BLOOD   Hemoglobin  Date Value Ref Range Status  11/28/2019 10.4 (L) 13.0 - 17.0 g/dL Final  01/07/2018 12.9 (L) 13.0 - 17.0 g/dL Final  12/01/2017 9.6 (L) 13.0 - 17.0 g/dL Final  12/08/2016 9.5 (L) 13.0 - 17.0 g/dL Final    EKG EKG Interpretation  Date/Time:  Sunday November 28 2019 12:21:49 EDT Ventricular Rate:  97 PR Interval:    QRS Duration: 90 QT Interval:  360 QTC Calculation: 458 R Axis:   30 Text Interpretation: Sinus rhythm Probable left atrial enlargement ST-t wave abnormality Artifact Abnormal ECG Confirmed by Carmin Muskrat [phone]) on 11/28/2019 3:49:25 PM   Radiology CT Head Wo Contrast  Result Date: 11/28/2019 CLINICAL DATA:  Stroke suspected. Bilateral hands and feet swelling over the past week. EXAM: CT HEAD WITHOUT CONTRAST TECHNIQUE: Contiguous axial images were obtained from the base of the skull through the vertex  without intravenous contrast. COMPARISON:  12/02/2016 FINDINGS: Brain: Ventricles, cisterns and other CSF spaces are within normal. There is mild chronic ischemic microvascular disease. There is no mass, mass effect, shift of midline structures or acute hemorrhage. No evidence of acute infarction. Vascular: No hyperdense vessel or unexpected calcification. Skull: Normal. Negative for fracture or focal lesion. Sinuses/Orbits: Prosthetic right globe. Left orbit is unremarkable. Paranasal sinuses are clear. Other: None. IMPRESSION: No acute findings. Chronic ischemic microvascular disease. Electronically Signed   By: Marin Olp M.D.   On: 11/28/2019 13:52   DG Chest Portable 1 View  Result Date: 11/28/2019 CLINICAL DATA:  Repeat chest x-ray with nipple markers. EXAM: PORTABLE CHEST 1 VIEW COMPARISON:  November 28, 2019 FINDINGS: The nodule projected over the right lower lobe on the previous study is a nipple shadow, marked with a nipple marker on this study. No pneumothorax. The lungs are otherwise clear. The heart, hila, mediastinum are unremarkable. No other acute abnormalities are identified. IMPRESSION: No active disease. Electronically Signed   By: Dorise Bullion III M.D   On: 11/28/2019 14:21   DG Chest Portable 1 View  Result Date: 11/28/2019 CLINICAL DATA:  Weakness EXAM: PORTABLE CHEST 1 VIEW COMPARISON:  12/01/2017 FINDINGS: The heart size and mediastinal contours are stable. Faint rounded 8 mm density projects over the peripheral aspect of the lower right hemithorax, favored to represent a nipple shadow. No focal airspace consolidation, pleural effusion, or pneumothorax. Advanced degenerative changes of the shoulders, right worse than left. IMPRESSION: 1. No active cardiopulmonary disease. 2. Faint 8 mm rounded density projects over the peripheral aspect of the lower right hemithorax, favored to represent a nipple shadow. Repeat chest radiograph with nipple markers could be performed to confirm.  Electronically Signed   By: Davina Poke D.O.   On: 11/28/2019 13:32   DG Knee Complete 4 Views Left  Result Date: 11/28/2019 CLINICAL DATA:  Left knee pain EXAM: LEFT KNEE - COMPLETE 4+ VIEW COMPARISON:  None. FINDINGS: No evidence of acute fracture. There is a old healed fracture of the proximal fibular metaphysis with bony callus formation. Osseous irregularity of the inferior pole of the patella with fragmentation, age indeterminate. Mild tricompartmental joint space narrowing. Small to moderate knee joint effusion. Soft tissue thickening in the prepatellar region and in the region of the patellar tendon. Severe vascular calcifications. IMPRESSION: 1. Osseous irregularity of the inferior pole of the patella with fragmentation. Findings are indeterminate and could reflect chronic posttraumatic or degenerative changes. Erosive/resorptive changes in the setting of osteomyelitis would have a similar appearance. If there is clinical concern for infection, arthrocentesis is recommended. 2. Small to moderate knee joint effusion, nonspecific. 3. Mild tricompartmental degenerative changes. 4. Chronic healed proximal fibular fracture. 5. Severe vascular calcifications. Electronically Signed   By: Davina Poke D.O.   On: 11/28/2019 13:39    Procedures Procedures (including critical care time)  Medications Ordered in ED Medications  LORazepam (ATIVAN) injection 0-4 mg (0 mg Intravenous Not Given 11/28/19 1403)    Or  LORazepam (ATIVAN) tablet 0-4  mg ( Oral See Alternative 11/28/19 1403)  LORazepam (ATIVAN) injection 0-4 mg (has no administration in time range)    Or  LORazepam (ATIVAN) tablet 0-4 mg (has no administration in time range)  thiamine tablet 100 mg (100 mg Oral Not Given 11/28/19 1403)    Or  thiamine (B-1) injection 100 mg ( Intravenous See Alternative 11/28/19 1403)  sodium chloride 0.9 % bolus 500 mL (0 mLs Intravenous Stopped 11/28/19 1300)  sodium chloride 0.9 % bolus 1,000 mL  (1,000 mLs Intravenous New Bag/Given 11/28/19 1510)    ED Course  I have reviewed the triage vital signs and the nursing notes.  Pertinent labs & imaging results that were available during my care of the patient were reviewed by me and considered in my medical decision making (see chart for details).  Clinical Course as of Nov 27 1552  Sun Nov 28, 2019  1430 My suspicion for acute infection in this joint is low based on the patient's presentation and physical exam findings.  DG Knee Complete 4 Views Left [SJ]  57 Spoke with Dr. Alfredia Ferguson, hospitalist. Requests MR brain while in ED. He will admit the patient.   [SJ]    Clinical Course User Index [SJ] Mirian Casco, Helane Gunther, PA-C   MDM Rules/Calculators/A&P                      Patient presents with what he describes as generalized weakness for the past week.  He was found to have definite left-sided weakness on the upper and lower extremity.  Stroke in this patient is a possibility. Patient is nontoxic appearing, afebrile, not tachycardic, not tachypneic, not hypotensive, maintains excellent SPO2 on room air, and is in no apparent distress.   I have reviewed the patient's chart to obtain more information.  I reviewed and interpreted the patient's labs and radiological studies. He also certainly has evidence of deconditioning.  Patient admitted for further work-up and management.  MRI pending at time of admission.  Findings and plan of care discussed with Verneda Skill, MD. Dr. Roderic Palau personally evaluated and examined this patient.   Vitals:   11/28/19 1415 11/28/19 1430 11/28/19 1445 11/28/19 1500  BP: (!) 138/93 140/82 135/84 (!) 145/92  Pulse:  96 96 99  Resp: 20 (!) 22 (!) 21 (!) 24  Temp:      TempSrc:      SpO2:  100% 100% 100%  Weight:      Height:         Final Clinical Impression(s) / ED Diagnoses Final diagnoses:  Acute left-sided weakness    Rx / DC Orders ED Discharge Orders    None       Layla Maw 11/28/19 1554    Milton Ferguson, MD 11/29/19 1034

## 2019-11-28 NOTE — ED Triage Notes (Signed)
Pt BIB [facility] and coming from home.  Pt has bilateral feet and hand swelling that has gotten progressively worse.  The pt has had the swelling for the past week but over the last 2-3 days, the swelling has gotten worse.

## 2019-11-28 NOTE — ED Notes (Signed)
Arlean Hopping, PA bedside assessing patient.

## 2019-11-28 NOTE — Progress Notes (Signed)
Pt admitted to the unit. Bed bugs observed on pt. Placed in specimen cup at bedside. Pt aox4 orientated to the unit. No questions or concerns from the pt at this time.

## 2019-11-28 NOTE — H&P (Signed)
History and Physical    [name] [id number] DOB: [birth year] DOA: 11/28/2019  PCP: Javier Docker, MD   Patient coming from: Home  Chief Complaint: Weakness and Pain in Knee and Poor Po Intake  HPI: Shawn Hendrix is a 69 y.o. male with medical history significant for but not limited too anemia, arthralgia, arthritis, gout, hypertension, history of prostate cancer, history of generalized weakness and malnutrition of moderate degree, history of alcohol withdrawal seizures as well as other comorbidities who presents with chief complaint of generalized weakness and deconditioning associated with pain in his left knee and hand.  Patient states that he started getting weaker about a week ago and states that he fell twice but did not actually hit the floor and had been caught by furniture.  Because he has been weak he has been sitting in a chair mostly in unable to get up and care for himself and has not been eating very well.  He typically drinks daily alcohol drinks a full bottle of wine daily but because of his weakness he stopped the drinking.  States that his girlfriend helps.  Patient states that his hand started swelling and started having some pain as well a week ago and denies any nausea or vomiting.  Denies any chest pain at this time but states that he does not have the strength or "power anymore."  Nuys any other concerns or complaints at this time and [facility] was asked to admit this patient for generalized weakness as well as pain in the knee and hand associated with poor p.o. intake  ED Course: In the ED he had basic blood work done as well as a CT of the head without contrast.  Given 500 mL bolus now also had an MRI ordered.  SARS-CoV-2 testing is still pending.  Urinalysis and urine culture were unremarkable but did show small hemoglobin in the urinalysis.  Review of Systems: As per HPI otherwise all other systems reviewed and negative.   Past Medical History:  Diagnosis  Date  . Anemia   . Arthralgia   . Arthritis   . Gout   . Hypertension   . Prostate cancer ([place])   . Seizure [facility])    Past Surgical History:  Procedure Laterality Date  . EYE SURGERY     R eye removal  . PROSTATE BIOPSY     SOCIAL HISTORY   reports that he has never smoked. He has never used smokeless tobacco. He reports current alcohol use of about 56.0 standard drinks of alcohol per week. He reports that he does not use drugs.  ALLERGIES  No Known Allergies  Family History  Problem Relation Age of Onset  . Cancer Sister   . Cancer Sister    Prior to Admission medications   Medication Sig Start Date End Date Taking? Authorizing Provider  acetaminophen (TYLENOL) 325 MG tablet Take 2 tablets (650 mg total) by mouth every 6 (six) hours as needed for mild pain (or Fever >/= 101). 12/08/16   Oswald Hillock, MD  colchicine 0.6 MG tablet Take 1 tablet (0.6 mg total) by mouth 2 (two) times daily. Patient taking differently: Take 0.6 mg by mouth 2 (two) times daily as needed.  12/01/17   Dorie Rank, MD  diltiazem (CARDIZEM CD) 240 MG 24 hr capsule Take 1 capsule (240 mg total) by mouth daily. 12/09/16   Oswald Hillock, MD  feeding supplement (BOOST / RESOURCE BREEZE) LIQD Take 1 Container by mouth 3 (three)  times daily between meals. 12/08/16   Oswald Hillock, MD  ferrous sulfate 325 (65 FE) MG tablet Take 1 tablet (325 mg total) by mouth daily with breakfast. 12/09/16   Oswald Hillock, MD  folic acid (FOLVITE) 1 MG tablet Take 1 tablet (1 mg total) by mouth daily. 12/09/16   Oswald Hillock, MD  gabapentin (NEURONTIN) 300 MG capsule Take 300 mg by mouth 2 (two) times daily.  10/25/17   [provider]  lisinopril (PRINIVIL,ZESTRIL) 20 MG tablet Take 1 tablet (20 mg total) by mouth daily. 12/09/16   Oswald Hillock, MD  multivitamin (PROSIGHT) TABS tablet Take 1 tablet by mouth daily. 12/09/16   Oswald Hillock, MD  omeprazole (PRILOSEC) 20 MG capsule Take 20 mg by mouth daily. 11/04/17   [provider]  thiamine 100 MG tablet Take 1 tablet (100 mg total) by mouth daily. 12/09/16   Oswald Hillock, MD  traMADol (ULTRAM) 50 MG tablet Take 1 tablet (50 mg total) by mouth every 6 (six) hours as needed. 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 Grossly normal hearing.  Patient has extremely poor dentition Neck: Appears normal, supple, no cervical masses, normal ROM, no appreciable thyromegaly; no JVD Respiratory: Diminished to auscultation bilaterally, no wheezing, rales, rhonchi or crackles. Normal respiratory effort and patient is not tachypenic. No accessory muscle use.  Unlabored breathing Cardiovascular: RRR, no murmurs / rubs / gallops. S1 and S2 auscultated. No extremity edema.  Abdomen: Soft, non-tender, non-distended. Bowel sounds positive.  GU: Deferred. Musculoskeletal: No clubbing / cyanosis of digits/nails.  Patient has significant amount of weakness on his left side especially his left hand grip as well as left leg.  Unable to lift his leg off the bed due to his weakness; left knee is extremely swollen and painful to palpate.  Also has swollen left hand with what appears to be tophi Skin: No rashes, lesions, ulcers on limited skin evaluation. No induration; Warm and dry.  Neurologic: CN 2-12 grossly intact with no focal deficits.  Diminished strength on the left side compared to the right. Romberg sign and cerebellar reflexes not assessed.  Psychiatric: Normal judgment and insight. Alert and oriented x  3. Normal mood and appropriate affect.   Labs on Admission: I have personally reviewed following labs and imaging studies  CBC: Recent Labs  Lab 11/28/19 1152  WBC 13.2*  NEUTROABS 10.7*  HGB 10.4*  HCT 30.2*  MCV 111.4*  PLT 123456   Basic Metabolic Panel: Recent Labs  Lab 11/28/19 1152  NA 129*  K 3.9  CL 87*  CO2 30  GLUCOSE 133*  BUN 26*  CREATININE 1.13  CALCIUM 9.1   GFR: Estimated Creatinine Clearance: 45.6 mL/min (by C-G formula based on SCr of 1.13 mg/dL). Liver Function Tests: Recent Labs  Lab 11/28/19 1152  AST 25  ALT 13  ALKPHOS 32  BILITOT 1.5*  PROT 7.5  ALBUMIN 2.9*   Recent Labs  Lab 11/28/19 1152  LIPASE 39   No results for input(s): AMMONIA in the last 168 hours. Coagulation Profile: No results for input(s): INR, PROTIME in the last 168 hours. Cardiac Enzymes: No results for input(s): CKTOTAL, CKMB, CKMBINDEX, TROPONINI in the last 168 hours. BNP (last 3 results) No results for input(s): PROBNP in the last 8760 hours. HbA1C: No results for input(s): HGBA1C in the last 72 hours. CBG: No results for input(s): GLUCAP in the last 168 hours. Lipid Profile: No results for input(s): CHOL, HDL, LDLCALC, TRIG, CHOLHDL, LDLDIRECT in the last 72 hours. Thyroid Function Tests: No results for input(s): TSH, T4TOTAL, FREET4, T3FREE, THYROIDAB in the last 72 hours. Anemia Panel: No results for input(s): VITAMINB12, FOLATE, FERRITIN, TIBC, IRON, RETICCTPCT in the last 72 hours. Urine analysis:    Component Value Date/Time   COLORURINE YELLOW 11/28/2019 1129   APPEARANCEUR CLEAR 11/28/2019 1129   LABSPEC 1.017 11/28/2019 1129   PHURINE 5.0 11/28/2019 1129   GLUCOSEU NEGATIVE 11/28/2019 1129   HGBUR SMALL (A) 11/28/2019 1129   BILIRUBINUR NEGATIVE 11/28/2019 1129   KETONESUR NEGATIVE 11/28/2019 1129   PROTEINUR 30 (A) 11/28/2019 1129   UROBILINOGEN 1.0 04/15/2014 1013   NITRITE NEGATIVE 11/28/2019 1129   LEUKOCYTESUR NEGATIVE 11/28/2019 1129     Sepsis Labs: !!!!!!!!!!!!!!!!!!!!!!!!!!!!!!!!!!!!!!!!!!!! @LABRCNTIP (procalcitonin:4,lacticidven:4) )No results found for this or any previous visit (from the past 240 hour(s)).   Radiological Exams on Admission: CT Head Wo Contrast  Result Date: 11/28/2019 CLINICAL DATA:  Stroke suspected. Bilateral hands and feet swelling over the past week. EXAM: CT HEAD WITHOUT CONTRAST TECHNIQUE: Contiguous axial images were obtained from the base of the skull through the vertex without intravenous contrast. COMPARISON:  12/02/2016 FINDINGS: Brain: Ventricles, cisterns and other CSF spaces are within normal. There is mild chronic ischemic microvascular disease. There is no mass, mass effect, shift of midline structures or acute hemorrhage. No evidence of acute infarction. Vascular: No hyperdense vessel or unexpected calcification. Skull: Normal. Negative for fracture or focal lesion. Sinuses/Orbits: Prosthetic right globe. Left orbit is unremarkable. Paranasal sinuses are clear. Other: None. IMPRESSION: No acute findings. Chronic ischemic microvascular disease. Electronically Signed   By: Marin Olp M.D.   On: 11/28/2019 13:52   DG Chest Portable 1 View  Result Date: 11/28/2019 CLINICAL DATA:  Repeat chest x-ray with nipple markers. EXAM: PORTABLE CHEST 1 VIEW COMPARISON:  November 28, 2019 FINDINGS: The nodule projected over the right lower lobe on the previous study is a nipple shadow, marked with a nipple marker on this study. No pneumothorax. The lungs are otherwise clear. The heart, hila, mediastinum are unremarkable. No other acute abnormalities are identified. IMPRESSION: No active disease. Electronically Signed   By: Dorise Bullion III M.D   On: 11/28/2019 14:21   DG Chest Portable 1 View  Result Date: 11/28/2019 CLINICAL DATA:  Weakness EXAM: PORTABLE CHEST 1 VIEW COMPARISON:  12/01/2017 FINDINGS: The heart size and mediastinal contours are stable. Faint rounded 8 mm density projects over the  peripheral aspect of the lower right hemithorax, favored to represent a nipple shadow. No focal airspace consolidation, pleural effusion, or pneumothorax. Advanced degenerative changes of the shoulders, right worse than left. IMPRESSION: 1. No active cardiopulmonary disease. 2. Faint 8 mm rounded density projects over the peripheral aspect of the lower right hemithorax, favored to represent a nipple shadow. Repeat chest radiograph with nipple markers could be performed to confirm. Electronically Signed  By: Davina Poke D.O.   On: 11/28/2019 13:32   DG Knee Complete 4 Views Left  Result Date: 11/28/2019 CLINICAL DATA:  Left knee pain EXAM: LEFT KNEE - COMPLETE 4+ VIEW COMPARISON:  None. FINDINGS: No evidence of acute fracture. There is a old healed fracture of the proximal fibular metaphysis with bony callus formation. Osseous irregularity of the inferior pole of the patella with fragmentation, age indeterminate. Mild tricompartmental joint space narrowing. Small to moderate knee joint effusion. Soft tissue thickening in the prepatellar region and in the region of the patellar tendon. Severe vascular calcifications. IMPRESSION: 1. Osseous irregularity of the inferior pole of the patella with fragmentation. Findings are indeterminate and could reflect chronic posttraumatic or degenerative changes. Erosive/resorptive changes in the setting of osteomyelitis would have a similar appearance. If there is clinical concern for infection, arthrocentesis is recommended. 2. Small to moderate knee joint effusion, nonspecific. 3. Mild tricompartmental degenerative changes. 4. Chronic healed proximal fibular fracture. 5. Severe vascular calcifications. Electronically Signed   By: Davina Poke D.O.   On: 11/28/2019 13:39   EKG: Independently reviewed. Showed Sinus Rhythm with a rate of 97 and a QTc of 458 and has no ST Elevation or Depression on my interpretation.  Assessment/Plan Active Problems:   Generalized  weakness  Generalized Weakness and Deconditioning -Place in observation telemetry -Started the patient on gentle IV fluid hydration and he received a bolus in the ED -Obtain PT and OT to consult -At baseline he is ambulatory and able to get up and walk around and general care for himself -Now he is really unable to move due to extreme weakness and pain in his leg and his hands. -He has fallen twice but not actually hit the floor this fell to the left side and states is because his knee gives out -Continue with supportive care -Check TSH, vitamin B12, vitamin B1   Left Sided Weakness -Hand is extremely weak and left leg is weaker compared to the right but MRI did not show any evidence of a acute CVA -CT scan of the head without contrast showed No acute findings. Chronic ischemic microvascular disease. 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  Brain volume loss seems fairly generalized with no definite areas of disproportionate involvement. Mild for age nonspecific white matter signal changes, most commonly due to small vessel disease. 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 -Obtaining left hand x-ray and showed "Severe degenerative changes of the left wrist with chronic SLAC wrist. Erosive changes to the ulnar styloid suggesting an underlying inflammatory arthropathy such as rheumatoid arthritis." -May need a dedicated cervical spine MRI scan but will hold off for now and will initially as if it was a gout flare causing his weakness in his hand -Denies any loss of sensation -May need to consider a Neurosurgery Consult if not improving with treatment of gout/Arthritis   Poor Oral Intake/Failure to Thrive -Consulted nutrition for further evaluation recommendations -Started on IV fluid hydration with normal saline at rate 75 MLS per  hour Continue with folic acid, multivitamin, and thiamine -Patient is an alcoholic and usually drinks at least daily  Hx of Alcohol Abuse -Initial alcohol level is less than 10 -Has not been drinking as much so he was concerned he was going to withdrawal as he has a history of withdrawal seizures -We will place on the CIWA protocol lorazepam and  will also continue folic acid, multivitamin and thiamine  Hyperbilirubinemia -Likely reactive as T bili was 1.5 -Continue to monitor and trend and continue with isolation -Repeat CMP in a.m.  Hyponatremia/Hypochloremia -The setting of poor p.o. intake -Patient's sodium was 129 and chloride levels 87  Leukocytosis -Reactive to suspected gouty arthropathy however cannot rule out osteomyelitis given his knee x-ray -Obtain MRI of the knee and uric acid level -Started colchicine -Currently he is afebrile and will hold off on antibiotic initiation until joint aspiration is done and MRI is back -Continue IV fluid hydration as above -Continue monitor for signs and symptoms of infection; -Repeat CBC in a.m.  Macrocytic Anemia -Likely in the setting of alcoholism -Patient's hemoglobin/hematocrit was 10.4/30.2 -Check anemia panel in the a.m. Plan continue to monitor for signs and symptoms of bleeding; currently no overt bleeding noted -Repeat CBC in a.m.  Left Knee Pain -Presented with painful palpation on his left knee and falling to the left side -DG knee showed "Osseous irregularity of the inferior pole of the patella with fragmentation. Findings are indeterminate and could reflect chronic posttraumatic or degenerative changes. Erosive/resorptive changes in the setting of osteomyelitis would have a similar appearance. If there is clinical concern for infection, arthrocentesis is recommended. Small to moderate knee joint effusion, nonspecific. Mild tricompartmental degenerative changes. Chronic healed proximal fibular fracture. Severe vascular  calcifications." -Consulted with Dr. Lorin Mercy of orthopedic surgery who recommends obtaining an MRI of the knee as well as an uric acid level  -also ordered for a joint aspiration of the knee and body fluid to be sent off -Started colchicine empirically but may need a prednisone taper -We will get PT OT to further evaluate and treat  DVT prophylaxis: Heparin 5,000 units sq q8h Code Status: FULL CODE  Family Communication: No family present at bedside  Disposition Plan: Pending further work-up and evaluation Consults called: Discussed Case with Neurology Dr. Lorraine Lax and with Orthopedic Surgery Dr. Lorin Mercy Admission status: Observation telemetry  Severity of Illness: The appropriate patient status for this patient is OBSERVATION. Observation status is judged to be reasonable and necessary in order to provide the required intensity of service to ensure the patient's safety. The patient's presenting symptoms, physical exam findings, and initial radiographic and laboratory data in the context of their medical condition is felt to place them at decreased risk for further clinical deterioration. Furthermore, it is anticipated that the patient will be medically stable for discharge from the hospital within 2 midnights of admission. The following factors support the patient status of observation.   " The patient's presenting symptoms include Generalized Weakness and Left Knee and Hand Pain. " The physical exam findings include Painful Left Knee and hand, Weak Hand, and . " The initial radiographic and laboratory data are showing some Hyponatremia, Leukocytosis and ? Knee Osteomyelitis  Kerney Elbe, D.O. [facility] PAGER is on [place]  If 7PM-7AM, please contact night-coverage [email or web address]  11/28/2019, 3:24 PM

## 2019-11-29 ENCOUNTER — Observation Stay (HOSPITAL_COMMUNITY): Payer: Medicare Other

## 2019-11-29 ENCOUNTER — Encounter (HOSPITAL_COMMUNITY): Payer: Self-pay | Admitting: Internal Medicine

## 2019-11-29 DIAGNOSIS — H18412 Arcus senilis, left eye: Secondary | ICD-10-CM | POA: Diagnosis present

## 2019-11-29 DIAGNOSIS — K529 Noninfective gastroenteritis and colitis, unspecified: Secondary | ICD-10-CM | POA: Diagnosis present

## 2019-11-29 DIAGNOSIS — R569 Unspecified convulsions: Secondary | ICD-10-CM | POA: Diagnosis present

## 2019-11-29 DIAGNOSIS — I340 Nonrheumatic mitral (valve) insufficiency: Secondary | ICD-10-CM | POA: Diagnosis present

## 2019-11-29 DIAGNOSIS — F1020 Alcohol dependence, uncomplicated: Secondary | ICD-10-CM | POA: Diagnosis present

## 2019-11-29 DIAGNOSIS — E878 Other disorders of electrolyte and fluid balance, not elsewhere classified: Secondary | ICD-10-CM | POA: Diagnosis present

## 2019-11-29 DIAGNOSIS — D509 Iron deficiency anemia, unspecified: Secondary | ICD-10-CM | POA: Diagnosis present

## 2019-11-29 DIAGNOSIS — I371 Nonrheumatic pulmonary valve insufficiency: Secondary | ICD-10-CM | POA: Diagnosis present

## 2019-11-29 DIAGNOSIS — R531 Weakness: Secondary | ICD-10-CM | POA: Diagnosis present

## 2019-11-29 DIAGNOSIS — R296 Repeated falls: Secondary | ICD-10-CM | POA: Diagnosis present

## 2019-11-29 DIAGNOSIS — Z20822 Contact with and (suspected) exposure to covid-19: Secondary | ICD-10-CM | POA: Diagnosis present

## 2019-11-29 DIAGNOSIS — M79672 Pain in left foot: Secondary | ICD-10-CM

## 2019-11-29 DIAGNOSIS — M79642 Pain in left hand: Secondary | ICD-10-CM | POA: Diagnosis present

## 2019-11-29 DIAGNOSIS — I10 Essential (primary) hypertension: Secondary | ICD-10-CM | POA: Diagnosis present

## 2019-11-29 DIAGNOSIS — E871 Hypo-osmolality and hyponatremia: Secondary | ICD-10-CM | POA: Diagnosis present

## 2019-11-29 DIAGNOSIS — A419 Sepsis, unspecified organism: Secondary | ICD-10-CM | POA: Diagnosis present

## 2019-11-29 DIAGNOSIS — M7989 Other specified soft tissue disorders: Secondary | ICD-10-CM | POA: Diagnosis present

## 2019-11-29 DIAGNOSIS — E538 Deficiency of other specified B group vitamins: Secondary | ICD-10-CM | POA: Diagnosis present

## 2019-11-29 DIAGNOSIS — E876 Hypokalemia: Secondary | ICD-10-CM | POA: Diagnosis not present

## 2019-11-29 DIAGNOSIS — D539 Nutritional anemia, unspecified: Secondary | ICD-10-CM | POA: Diagnosis present

## 2019-11-29 DIAGNOSIS — R17 Unspecified jaundice: Secondary | ICD-10-CM | POA: Diagnosis present

## 2019-11-29 DIAGNOSIS — R627 Adult failure to thrive: Secondary | ICD-10-CM | POA: Diagnosis present

## 2019-11-29 DIAGNOSIS — M109 Gout, unspecified: Secondary | ICD-10-CM | POA: Diagnosis present

## 2019-11-29 DIAGNOSIS — F1010 Alcohol abuse, uncomplicated: Secondary | ICD-10-CM | POA: Diagnosis not present

## 2019-11-29 DIAGNOSIS — M1712 Unilateral primary osteoarthritis, left knee: Secondary | ICD-10-CM | POA: Diagnosis present

## 2019-11-29 DIAGNOSIS — Z8546 Personal history of malignant neoplasm of prostate: Secondary | ICD-10-CM | POA: Diagnosis not present

## 2019-11-29 LAB — URINALYSIS, ROUTINE W REFLEX MICROSCOPIC
Bilirubin Urine: NEGATIVE
Glucose, UA: NEGATIVE mg/dL
Ketones, ur: NEGATIVE mg/dL
Leukocytes,Ua: NEGATIVE
Nitrite: NEGATIVE
Protein, ur: 30 mg/dL — AB
Specific Gravity, Urine: 1.016 (ref 1.005–1.030)
pH: 5 (ref 5.0–8.0)

## 2019-11-29 LAB — SYNOVIAL CELL COUNT + DIFF, W/ CRYSTALS
Monocyte-Macrophage-Synovial Fluid: 9 % — ABNORMAL LOW (ref 50–90)
Neutrophil, Synovial: 91 % — ABNORMAL HIGH (ref 0–25)
WBC, Synovial: 21840 /mm3 — ABNORMAL HIGH (ref 0–200)

## 2019-11-29 LAB — CBC WITH DIFFERENTIAL/PLATELET
Abs Immature Granulocytes: 0.15 10*3/uL — ABNORMAL HIGH (ref 0.00–0.07)
Basophils Absolute: 0 10*3/uL (ref 0.0–0.1)
Basophils Relative: 0 %
Eosinophils Absolute: 0 10*3/uL (ref 0.0–0.5)
Eosinophils Relative: 0 %
HCT: 25.7 % — ABNORMAL LOW (ref 39.0–52.0)
Hemoglobin: 8.7 g/dL — ABNORMAL LOW (ref 13.0–17.0)
Immature Granulocytes: 2 %
Lymphocytes Relative: 4 %
Lymphs Abs: 0.5 10*3/uL — ABNORMAL LOW (ref 0.7–4.0)
MCH: 38 pg — ABNORMAL HIGH (ref 26.0–34.0)
MCHC: 33.9 g/dL (ref 30.0–36.0)
MCV: 112.2 fL — ABNORMAL HIGH (ref 80.0–100.0)
Monocytes Absolute: 1.8 10*3/uL — ABNORMAL HIGH (ref 0.1–1.0)
Monocytes Relative: 17 %
Neutro Abs: 7.9 10*3/uL — ABNORMAL HIGH (ref 1.7–7.7)
Neutrophils Relative %: 77 %
Platelets: 273 10*3/uL (ref 150–400)
RBC: 2.29 MIL/uL — ABNORMAL LOW (ref 4.22–5.81)
RDW: 14.2 % (ref 11.5–15.5)
WBC: 10.3 10*3/uL (ref 4.0–10.5)
nRBC: 0 % (ref 0.0–0.2)

## 2019-11-29 LAB — URINE CULTURE: Culture: <10000 — AB

## 2019-11-29 LAB — COMPREHENSIVE METABOLIC PANEL
ALT: 11 U/L (ref 0–44)
AST: 20 U/L (ref 15–41)
Albumin: 2.6 g/dL — ABNORMAL LOW (ref 3.5–5.0)
Alkaline Phosphatase: 42 U/L (ref 38–126)
Anion gap: 11 (ref 5–15)
BUN: 23 mg/dL (ref 8–23)
CO2: 27 mmol/L (ref 22–32)
Calcium: 8.5 mg/dL — ABNORMAL LOW (ref 8.9–10.3)
Chloride: 93 mmol/L — ABNORMAL LOW (ref 98–111)
Creatinine, Ser: 1.02 mg/dL (ref 0.61–1.24)
GFR calc Af Amer: >60 mL/min (ref >60)
GFR calc non Af Amer: >60 mL/min (ref >60)
Glucose, Bld: 112 mg/dL — ABNORMAL HIGH (ref 70–99)
Potassium: 3.9 mmol/L (ref 3.5–5.1)
Sodium: 131 mmol/L — ABNORMAL LOW (ref 135–145)
Total Bilirubin: 1.3 mg/dL — ABNORMAL HIGH (ref 0.3–1.2)
Total Protein: 6.5 g/dL (ref 6.5–8.1)

## 2019-11-29 LAB — PHOSPHORUS: Phosphorus: 2.8 mg/dL (ref 2.5–4.6)

## 2019-11-29 LAB — MAGNESIUM: Magnesium: 1.6 mg/dL — ABNORMAL LOW (ref 1.7–2.4)

## 2019-11-29 LAB — LACTIC ACID, PLASMA
Lactic Acid, Venous: 1.8 mmol/L (ref 0.5–1.9)
Lactic Acid, Venous: 1.6 mmol/L (ref 0.5–1.9)

## 2019-11-29 LAB — GLUCOSE, CAPILLARY: Glucose-Capillary: 127 mg/dL — ABNORMAL HIGH (ref 70–99)

## 2019-11-29 LAB — PROCALCITONIN: Procalcitonin: 0.73 ng/mL

## 2019-11-29 LAB — PROTIME-INR
INR: 1.1 (ref 0.8–1.2)
Prothrombin Time: 14.4 seconds (ref 11.4–15.2)

## 2019-11-29 LAB — APTT: aPTT: 42 seconds — ABNORMAL HIGH (ref 24–36)

## 2019-11-29 MED ORDER — ALLOPURINOL 100 MG PO TABS
100.0000 mg | ORAL_TABLET | Freq: Every day | ORAL | Status: DC
  Administered 2019-11-29 – 2019-12-07 (×9): 100 mg via ORAL
  Filled 2019-11-29 (×9): qty 1

## 2019-11-29 MED ORDER — VITAMIN B-12 1000 MCG PO TABS
1000.0000 ug | ORAL_TABLET | Freq: Every day | ORAL | Status: DC
  Administered 2019-11-30 – 2019-12-01 (×2): 1000 ug via ORAL
  Filled 2019-11-29 (×2): qty 1

## 2019-11-29 MED ORDER — VANCOMYCIN HCL 750 MG/150ML IV SOLN
750.0000 mg | INTRAVENOUS | Status: DC
  Administered 2019-11-29: 750 mg via INTRAVENOUS
  Filled 2019-11-29 (×2): qty 150

## 2019-11-29 MED ORDER — SODIUM CHLORIDE 0.9 % IV BOLUS
500.0000 mL | Freq: Once | INTRAVENOUS | Status: AC
  Administered 2019-11-29: 500 mL via INTRAVENOUS

## 2019-11-29 MED ORDER — SODIUM CHLORIDE 0.9 % IV SOLN
2.0000 g | Freq: Once | INTRAVENOUS | Status: AC
  Administered 2019-11-29: 09:00:00 2 g via INTRAVENOUS
  Filled 2019-11-29: qty 2

## 2019-11-29 MED ORDER — MAGNESIUM SULFATE 2 GM/50ML IV SOLN
2.0000 g | Freq: Once | INTRAVENOUS | Status: AC
  Administered 2019-11-29: 2 g via INTRAVENOUS
  Filled 2019-11-29: qty 50

## 2019-11-29 MED ORDER — VANCOMYCIN HCL IN DEXTROSE 1-5 GM/200ML-% IV SOLN
1000.0000 mg | Freq: Once | INTRAVENOUS | Status: AC
  Administered 2019-11-29: 1000 mg via INTRAVENOUS
  Filled 2019-11-29: qty 200

## 2019-11-29 MED ORDER — METRONIDAZOLE IN NACL 5-0.79 MG/ML-% IV SOLN
500.0000 mg | Freq: Three times a day (TID) | INTRAVENOUS | Status: DC
  Administered 2019-11-29: 500 mg via INTRAVENOUS
  Filled 2019-11-29: qty 100

## 2019-11-29 MED ORDER — IOHEXOL 300 MG/ML  SOLN
100.0000 mL | Freq: Once | INTRAMUSCULAR | Status: AC | PRN
  Administered 2019-11-29: 100 mL via INTRAVENOUS

## 2019-11-29 MED ORDER — METRONIDAZOLE IN NACL 5-0.79 MG/ML-% IV SOLN
500.0000 mg | Freq: Three times a day (TID) | INTRAVENOUS | Status: DC
  Administered 2019-11-29 – 2019-12-04 (×15): 500 mg via INTRAVENOUS
  Filled 2019-11-29 (×16): qty 100

## 2019-11-29 MED ORDER — SODIUM CHLORIDE 0.9 % IV BOLUS
1000.0000 mL | Freq: Once | INTRAVENOUS | Status: AC
  Administered 2019-11-29: 1000 mL via INTRAVENOUS

## 2019-11-29 MED ORDER — CYANOCOBALAMIN 1000 MCG/ML IJ SOLN
1000.0000 ug | Freq: Once | INTRAMUSCULAR | Status: AC
  Administered 2019-11-29: 14:00:00 1000 ug via INTRAMUSCULAR
  Filled 2019-11-29: qty 1

## 2019-11-29 MED ORDER — SODIUM CHLORIDE 0.9 % IV SOLN
2.0000 g | Freq: Two times a day (BID) | INTRAVENOUS | Status: DC
  Administered 2019-11-29 – 2019-12-01 (×5): 2 g via INTRAVENOUS
  Filled 2019-11-29 (×7): qty 2

## 2019-11-29 MED ORDER — SODIUM CHLORIDE (PF) 0.9 % IJ SOLN
INTRAMUSCULAR | Status: AC
  Filled 2019-11-29: qty 50

## 2019-11-29 MED ORDER — VITAMIN B-12 1000 MCG PO TABS
1000.0000 ug | ORAL_TABLET | Freq: Every day | ORAL | Status: DC
  Administered 2019-11-29: 1000 ug via ORAL
  Filled 2019-11-29: qty 1

## 2019-11-29 NOTE — Consult Note (Addendum)
Reason for Consult: Left knee pain and swelling Referring Physician: Hospitalist  Shawn Hendrix is an 69 y.o. male.  HPI: This patient is being seen at the request of the hospitalist service for left knee pain and swelling.  Patient states that he has had worsening left knee pain for about a week.  No injury.  Patient admits to a history of gout.  Pain when he bends his knee and weightbearing.  Has been running a fever.  Dr. Lorin Mercy has reviewed patient's x-rays.  He just returned from having a left knee MRI before arrived to the floor.  Past Medical History:  Diagnosis Date  . Anemia   . Arthralgia   . Arthritis   . Gout   . Hypertension   . Prostate cancer ([place])   . Seizure [facility])     Past Surgical History:  Procedure Laterality Date  . EYE SURGERY     R eye removal  . PROSTATE BIOPSY      Family History  Problem Relation Age of Onset  . Cancer Sister   . Cancer Sister     Social History:  reports that he has never smoked. He has never used smokeless tobacco. He reports current alcohol use of about 56.0 standard drinks of alcohol per week. He reports that he does not use drugs.  Allergies: No Known Allergies  Medications: I have reviewed the patient's current medications.  Results for orders placed or performed during the hospital encounter of 11/28/19 (from the past 48 hour(s))  Urinalysis, Routine w reflex microscopic     Status: Abnormal   Collection Time: 11/28/19 11:29 AM  Result Value Ref Range   Color, Urine YELLOW YELLOW   APPearance CLEAR CLEAR   Specific Gravity, Urine 1.017 1.005 - 1.030   pH 5.0 5.0 - 8.0   Glucose, UA NEGATIVE NEGATIVE mg/dL   Hgb urine dipstick SMALL (A) NEGATIVE   Bilirubin Urine NEGATIVE NEGATIVE   Ketones, ur NEGATIVE NEGATIVE mg/dL   Protein, ur 30 (A) NEGATIVE mg/dL   Nitrite NEGATIVE NEGATIVE   Leukocytes,Ua NEGATIVE NEGATIVE   RBC / HPF 0-5 0 - 5 RBC/hpf   WBC, UA 0-5 0 - 5 WBC/hpf   Bacteria, UA NONE SEEN NONE SEEN    Squamous Epithelial / LPF 0-5 0 - 5   Mucus PRESENT    Hyaline Casts, UA PRESENT    Granular Casts, UA PRESENT     Comment: Performed at [facility], [place] [address], [place], [place] [address]  Urine rapid drug screen (hosp performed)     Status: None   Collection Time: 11/28/19 11:29 AM  Result Value Ref Range   Opiates NONE DETECTED NONE DETECTED   Cocaine NONE DETECTED NONE DETECTED   Benzodiazepines NONE DETECTED NONE DETECTED   Amphetamines NONE DETECTED NONE DETECTED   Tetrahydrocannabinol NONE DETECTED NONE DETECTED   Barbiturates NONE DETECTED NONE DETECTED    Comment: (NOTE) DRUG SCREEN FOR MEDICAL PURPOSES ONLY.  IF CONFIRMATION IS NEEDED FOR ANY PURPOSE, NOTIFY LAB WITHIN 5 DAYS. LOWEST DETECTABLE LIMITS FOR URINE DRUG SCREEN Drug Class                     Cutoff (ng/mL) Amphetamine and metabolites    1000 Barbiturate and metabolites    200 Benzodiazepine                 [id number] Tricyclics and metabolites     300 Opiates and metabolites  300 Cocaine and metabolites        300 THC                            50 Performed at [facility], [place] [address], [place], [name] [address]   Comprehensive metabolic panel     Status: Abnormal   Collection Time: 11/28/19 11:52 AM  Result Value Ref Range   Sodium 129 (L) 135 - 145 mmol/L   Potassium 3.9 3.5 - 5.1 mmol/L   Chloride 87 (L) 98 - 111 mmol/L   CO2 30 22 - 32 mmol/L   Glucose, Bld 133 (H) 70 - 99 mg/dL    Comment: Glucose reference range applies only to samples taken after fasting for at least 8 hours.   BUN 26 (H) 8 - 23 mg/dL   Creatinine, Ser 1.13 0.61 - 1.24 mg/dL   Calcium 9.1 8.9 - 10.3 mg/dL   Total Protein 7.5 6.5 - 8.1 g/dL   Albumin 2.9 (L) 3.5 - 5.0 g/dL   AST 25 15 - 41 U/L   ALT 13 0 - 44 U/L   Alkaline Phosphatase 57 38 - 126 U/L   Total Bilirubin 1.5 (H) 0.3 - 1.2 mg/dL   GFR calc non Af Amer >60 >60 mL/min   GFR calc Af Amer >60 >60 mL/min   Anion gap  12 5 - 15    Comment: Performed at [facility], [place] [address] [place], [place] [address]  CBC with Differential     Status: Abnormal   Collection Time: 11/28/19 11:52 AM  Result Value Ref Range   WBC 13.2 (H) 4.0 - 10.5 K/uL   RBC 2.71 (L) 4.22 - 5.81 MIL/uL   Hemoglobin 10.4 (L) 13.0 - 17.0 g/dL   HCT 30.2 (L) 39.0 - 52.0 %   MCV 111.4 (H) 80.0 - 100.0 fL   MCH 38.4 (H) 26.0 - 34.0 pg   MCHC 34.4 30.0 - 36.0 g/dL   RDW 14.0 11.5 - 15.5 %   Platelets 255 150 - 400 K/uL   nRBC 0.0 0.0 - 0.2 %   Neutrophils Relative % 81 %   Neutro Abs 10.7 (H) 1.7 - 7.7 K/uL   Lymphocytes Relative 3 %   Lymphs Abs 0.4 (L) 0.7 - 4.0 K/uL   Monocytes Relative 14 %   Monocytes Absolute 1.9 (H) 0.1 - 1.0 K/uL   Eosinophils Relative 0 %   Eosinophils Absolute 0.0 0.0 - 0.5 K/uL   Basophils Relative 0 %   Basophils Absolute 0.0 0.0 - 0.1 K/uL   RBC Morphology MORPHOLOGY UNREMARKABLE    Immature Granulocytes 2 %   Abs Immature Granulocytes 0.21 (H) 0.00 - 0.07 K/uL    Comment: Performed at [facility], [place] [address], [place], [place] [address]  Ethanol     Status: None   Collection Time: 11/28/19 11:52 AM  Result Value Ref Range   Alcohol, Ethyl (B) <10 <10 mg/dL    Comment: (NOTE) Lowest detectable limit for serum alcohol is 10 mg/dL. For medical purposes only. Performed at [facility], [place] [address], [place], [place] [address]   Lipase, blood     Status: None   Collection Time: 11/28/19 11:52 AM  Result Value Ref Range   Lipase 39 11 - 51 U/L    Comment: Performed at [facility], [place] [address] [place],  [address]  SARS  CORONAVIRUS 2 (TAT 6-24 HRS) Nasopharyngeal Nasopharyngeal Swab     Status: None   Collection Time: 11/28/19 11:55 AM   Specimen: Nasopharyngeal Swab  Result Value Ref Range   SARS Coronavirus 2 NEGATIVE NEGATIVE    Comment: (NOTE) SARS-CoV-2 target nucleic acids are NOT  DETECTED. The SARS-CoV-2 RNA is generally detectable in upper and lower respiratory specimens during the acute phase of infection. Negative results do not preclude SARS-CoV-2 infection, do not rule out co-infections with other pathogens, and should not be used as the sole basis for treatment or other patient management decisions. Negative results must be combined with clinical observations, patient history, and epidemiological information. The expected result is Negative. Fact Sheet for Patients: SugarRoll.be Fact Sheet for Healthcare Providers: https://www.woods-mathews.com/ This test is not yet approved or cleared by the [place] FDA and  has been authorized for detection and/or diagnosis of SARS-CoV-2 by FDA under an Emergency Use Authorization (EUA). This EUA will remain  in effect (meaning this test can be used) for the duration of the COVID-19 declaration under Section 56 4(b)(1) of the Act, 21 U.S.C. section 360bbb-3(b)(1), unless the authorization is terminated or revoked sooner. Performed at [facility], [place] [address] [place], [place] [address]   Culture, blood (routine x 2)     Status: None (Preliminary result)   Collection Time: 11/28/19  3:26 PM   Specimen: BLOOD  Result Value Ref Range   Specimen Description      BLOOD RIGHT HAND Performed at [place] [address], [place], [place] [address]    Special Requests      BOTTLES DRAWN AEROBIC ONLY Blood Culture results may not be optimal due to an inadequate volume of blood received in culture bottles Performed at [place] [address] [place], [place] [address]    Culture      NO GROWTH < 12 HOURS Performed at [place] [address], [place], [place] [address]    Report Status PENDING   Uric acid     Status: None   Collection Time: 11/28/19  7:53 PM  Result Value Ref Range   Uric Acid, Serum 8.6  3.7 - 8.6 mg/dL    Comment: Performed at [facility], [place] [address] [place], [place] [address]  Vitamin B12     Status: Abnormal   Collection Time: 11/28/19  7:53 PM  Result Value Ref Range   Vitamin B-12 62 (L) 180 - 914 pg/mL    Comment: (NOTE) This assay is not validated for testing neonatal or myeloproliferative syndrome specimens for Vitamin B12 levels. Performed at [facility], [place] [address], [place], [place] [address]   Folate     Status: Abnormal   Collection Time: 11/28/19  7:53 PM  Result Value Ref Range   Folate 5.6 (L) >5.9 ng/mL    Comment: Performed at [facility], [place] [address], [place], [place] [address]  Iron and TIBC     Status: Abnormal   Collection Time: 11/28/19  7:53 PM  Result Value Ref Range   Iron 18 (L) 45 - 182 ug/dL   TIBC 192 (L) 250 - 450 ug/dL   Saturation Ratios 9 (L) 17.9 - 39.5 %   UIBC 174 ug/dL    Comment: Performed at [facility], [place] [address], [place], [place] [address]  Ferritin     Status: Abnormal   Collection Time: 11/28/19  7:53 PM  Result Value Ref Range   Ferritin 1,056 (H) 24 - 336 ng/mL    Comment: Performed at [facility], [place] [address] [place], [place] [address]  Reticulocytes     Status: Abnormal   Collection Time: 11/28/19  7:53 PM  Result Value Ref Range   Retic Ct Pct 3.8 (H) 0.4 - 3.1 %   RBC. 2.19 (L) 4.22 - 5.81 MIL/uL   Retic Count, Absolute 82.8 19.0 - 186.0 K/uL   Immature Retic Fract 13.3 2.3 - 15.9 %    Comment: Performed at [facility], [place] [address] [place], [place] [address]  TSH     Status: None   Collection Time: 11/28/19  7:53 PM  Result Value Ref Range   TSH 0.515 0.350 - 4.500 uIU/mL    Comment: Performed by a 3rd Generation assay with a functional sensitivity of <=0.01 uIU/mL. Performed at [facility], [place] [address], [place], [place] [address]    Comprehensive metabolic panel     Status: Abnormal   Collection Time: 11/29/19  5:20 AM  Result Value Ref Range   Sodium 131 (L) 135 - 145 mmol/L   Potassium 3.9 3.5 - 5.1 mmol/L   Chloride 93 (L) 98 - 111 mmol/L   CO2 27 22 - 32 mmol/L   Glucose, Bld 112 (H) 70 - 99 mg/dL    Comment: Glucose reference range applies only to samples taken after fasting for at least 8 hours.   BUN 23 8 - 23 mg/dL   Creatinine, Ser 1.02 0.61 - 1.24 mg/dL   Calcium 8.5 (L) 8.9 - 10.3 mg/dL   Total Protein 6.5 6.5 - 8.1 g/dL   Albumin 2.6 (L) 3.5 - 5.0 g/dL   AST 20 15 - 41 U/L   ALT 11 0 - 44 U/L   Alkaline Phosphatase 42 38 - 126 U/L   Total Bilirubin 1.3 (H) 0.3 - 1.2 mg/dL   GFR calc non Af Amer >60 >60 mL/min   GFR calc Af Amer >60 >60 mL/min   Anion gap 11 5 - 15    Comment: Performed at [facility], [place] [address] [place], [place] [address]  CBC with Differential/Platelet     Status: Abnormal   Collection Time: 11/29/19  5:20 AM  Result Value Ref Range   WBC 10.3 4.0 - 10.5 K/uL   RBC 2.29 (L) 4.22 - 5.81 MIL/uL   Hemoglobin 8.7 (L) 13.0 - 17.0 g/dL   HCT 25.7 (L) 39.0 - 52.0 %   MCV 112.2 (H) 80.0 - 100.0 fL   MCH 38.0 (H) 26.0 - 34.0 pg   MCHC 33.9 30.0 - 36.0 g/dL   RDW 14.2 11.5 - 15.5 %   Platelets 273 150 - 400 K/uL   nRBC 0.0 0.0 - 0.2 %   Neutrophils Relative % 77 %   Neutro Abs 7.9 (H) 1.7 - 7.7 K/uL   Lymphocytes Relative 4 %   Lymphs Abs 0.5 (L) 0.7 - 4.0 K/uL   Monocytes Relative 17 %   Monocytes Absolute 1.8 (H) 0.1 - 1.0 K/uL   Eosinophils Relative 0 %   Eosinophils Absolute 0.0 0.0 - 0.5 K/uL   Basophils Relative 0 %   Basophils Absolute 0.0 0.0 - 0.1 K/uL   Immature Granulocytes 2 %   Abs Immature Granulocytes 0.15 (H) 0.00 - 0.07 K/uL    Comment: Performed at [facility], [place] [address] [place] [address], [place], [place] [address]  Phosphorus     Status: None   Collection Time: 11/29/19  5:20 AM  Result Value Ref Range   Phosphorus 2.8  2.5 - 4.6 mg/dL    Comment: Performed at [facility], [place] [address] [place], [place] [address]  Magnesium     Status: Abnormal   Collection Time: 11/29/19  5:20 AM  Result Value Ref Range   Magnesium 1.6 (L) 1.7 - 2.4 mg/dL    Comment: Performed at [facility], [place] [address] [place], [place] [address]  Procalcitonin - Baseline     Status: None   Collection Time: 11/29/19  5:20 AM  Result Value Ref Range   Procalcitonin 0.73 ng/mL    Comment:        Interpretation: PCT > 0.5 ng/mL and <= 2 ng/mL: Systemic infection (sepsis) is possible, but other conditions are known to elevate PCT as well. (NOTE)       Sepsis PCT Algorithm           Lower Respiratory Tract                                      Infection PCT Algorithm    ----------------------------     ----------------------------         PCT < 0.25 ng/mL                PCT < 0.10 ng/mL         Strongly encourage             Strongly discourage   discontinuation of antibiotics    initiation of antibiotics    ----------------------------     -----------------------------       PCT 0.25 - 0.50 ng/mL            PCT 0.10 - 0.25 ng/mL               OR       >80% decrease in PCT            Discourage initiation of                                            antibiotics      Encourage discontinuation           of antibiotics    ----------------------------     -----------------------------         PCT >= 0.50 ng/mL              PCT 0.26 - 0.50 ng/mL                AND       <80% decrease in PCT             Encourage initiation of                                             antibiotics       Encourage continuation           of antibiotics    ----------------------------     -----------------------------        PCT >= 0.50 ng/mL  PCT > 0.50 ng/mL               AND         increase in PCT                  Strongly encourage                                      initiation of  antibiotics    Strongly encourage escalation           of antibiotics                                     -----------------------------                                           PCT <= 0.25 ng/mL                                                 OR                                        > 80% decrease in PCT                                     Discontinue / Do not initiate                                             antibiotics Performed at [place] [address], [place], [place] [address]   Lactic acid, plasma     Status: None   Collection Time: 11/29/19  7:33 AM  Result Value Ref Range   Lactic Acid, Venous 1.8 0.5 - 1.9 mmol/L    Comment: Performed at [facility], [place] [address] [place], [place] [address]  Protime-INR     Status: None   Collection Time: 11/29/19  7:33 AM  Result Value Ref Range   Prothrombin Time 14.4 11.4 - 15.2 seconds   INR 1.1 0.8 - 1.2    Comment: (NOTE) INR goal varies based on device and disease states. Performed at [facility], [place] [address], [place], [place] [address]   APTT     Status: Abnormal   Collection Time: 11/29/19  7:33 AM  Result Value Ref Range   aPTT 42 (H) 24 - 36 seconds    Comment:        IF BASELINE aPTT IS ELEVATED, SUGGEST PATIENT RISK ASSESSMENT BE USED TO DETERMINE APPROPRIATE ANTICOAGULANT THERAPY. Performed at [facility], [place] [address] [place], [place] [address]   Glucose, capillary     Status: Abnormal   Collection Time: 11/29/19  7:56 AM  Result Value Ref Range   Glucose-Capillary 127 (H) 70 - 99  mg/dL    Comment: Glucose reference range applies only to samples taken after fasting for at least 8 hours.  Lactic acid, plasma     Status: None   Collection Time: 11/29/19  9:38 AM  Result Value Ref Range   Lactic Acid, Venous 1.6 0.5 - 1.9 mmol/L    Comment: Performed at [facility], [place] [address],  [place], [place] [address]    CT Head Wo Contrast  Result Date: 11/28/2019 CLINICAL DATA:  Stroke suspected. Bilateral hands and feet swelling over the past week. EXAM: CT HEAD WITHOUT CONTRAST TECHNIQUE: Contiguous axial images were obtained from the base of the skull through the vertex without intravenous contrast. COMPARISON:  12/02/2016 FINDINGS: Brain: Ventricles, cisterns and other CSF spaces are within normal. There is mild chronic ischemic microvascular disease. There is no mass, mass effect, shift of midline structures or acute hemorrhage. No evidence of acute infarction. Vascular: No hyperdense vessel or unexpected calcification. Skull: Normal. Negative for fracture or focal lesion. Sinuses/Orbits: Prosthetic right globe. Left orbit is unremarkable. Paranasal sinuses are clear. Other: None. IMPRESSION: No acute findings. Chronic ischemic microvascular disease. Electronically Signed   By: Marin Olp M.D.   On: 11/28/2019 13:52   MR BRAIN WO CONTRAST  Result Date: 11/28/2019 CLINICAL DATA:  69 year old male with possible stroke. Extremity swelling. Alcohol withdrawal seizures. EXAM: MRI HEAD WITHOUT CONTRAST TECHNIQUE: Multiplanar, multiecho pulse sequences of the brain and surrounding structures were obtained without intravenous contrast. COMPARISON:  Head CT earlier today. FINDINGS: Brain: No restricted diffusion to suggest acute infarction. No midline shift, mass effect, evidence of mass lesion, ventriculomegaly, extra-axial collection or acute intracranial hemorrhage. Cervicomedullary junction and pituitary are within normal limits. Cerebral volume loss which seems fairly generalized. The parietal lobes may be relatively spared. Superimposed mild for age bilateral cerebral white matter T2 and FLAIR hyperintensity. No cortical encephalomalacia or chronic cerebral blood products identified. Signal in the deep gray nuclei, brainstem and cerebellum within normal limits. Vascular: Major intracranial  vascular flow voids are preserved. Skull and upper cervical spine: Advanced cervical spine degeneration on series 5, image 13 with widespread mild degenerative cervical spinal stenosis suspected. Degenerative appearing sclerosis of the cervical vertebrae. Skull bone marrow signal appears normal. Sinuses/Orbits: Right orbital prosthesis. Otherwise negative orbits soft tissues. Paranasal sinuses and mastoids are stable and well pneumatized. Other: Visible internal auditory structures appear normal. There is a tiny forehead scalp lipoma in the midline on series 5, image 12. IMPRESSION: 1. No acute intracranial abnormality. 2. Brain volume loss seems fairly generalized with no definite areas of disproportionate involvement. Mild for age nonspecific white matter signal changes, most commonly due to small vessel disease. 3. 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 Electronically Signed   By: Genevie Ann M.D.   On: 11/28/2019 16:33   CT ABDOMEN PELVIS W CONTRAST  Result Date: 11/29/2019 CLINICAL DATA:  Generalized weakness and vomiting. EXAM: CT ABDOMEN AND PELVIS WITH CONTRAST TECHNIQUE: Multidetector CT imaging of the abdomen and pelvis was performed using the standard protocol following bolus administration of intravenous contrast. CONTRAST:  174mL OMNIPAQUE IOHEXOL 300 MG/ML  SOLN COMPARISON:  01/16/2018 FINDINGS: Lower chest: The heart is within normal limits in size. No pericardial effusion. Coronary artery and aortic calcifications are noted. Moderate breathing motion artifact. Streaky bibasilar atelectasis but no infiltrates or pulmonary lesions. Hepatobiliary: No focal hepatic lesions or intrahepatic biliary dilatation. Diffusely thick walled gallbladder could be due to lack of distension or low albumin. The portal  and hepatic veins are patent. No common bile duct dilatation. Pancreas: No mass, inflammation or ductal dilatation. Spleen: Normal size. No focal lesions.  Adrenals/Urinary Tract: The adrenal glands are unremarkable. No renal, ureteral or bladder calculi or mass. Mild perinephric interstitial changes appears stable and likely due to prior inflammation. Stomach/Bowel: The stomach, duodenum, small bowel and colon are grossly normal. Mild wall thickening and submucosal edema involving the ascending colon could suggest an inflammatory or infectious colitis. The terminal ileum and appendix appear normal. Scattered colonic diverticulosis without findings for acute diverticulitis. Moderate stool noted in the sigmoid colon and rectum. Vascular/Lymphatic: Stable tortuosity and moderate atherosclerotic calcifications involving the abdominal aorta and iliac arteries but no aneurysm or dissection. The branch vessels are patent. The major venous structures are patent. No mesenteric or retroperitoneal mass or adenopathy. Reproductive: Brachytherapy seeds are noted in the prostate gland. The seminal vesicles appear normal. Other: No pelvic mass or pelvic adenopathy. No free pelvic fluid collections. No inguinal mass or hernia. Musculoskeletal: No significant bony findings. IMPRESSION: 1. Mild wall thickening and submucosal edema involving the ascending colon could suggest an inflammatory or infectious colitis. 2. No other significant abdominal/pelvic findings, mass lesions or adenopathy. 3. Gallbladder wall thickening could be due to lack of distension/contraction. 4. Stable atherosclerotic calcifications involving the aorta and iliac arteries. Aortic Atherosclerosis (ICD10-I70.0). Electronically Signed   By: Marijo Sanes M.D.   On: 11/29/2019 11:38   DG Hand 2 View Left  Result Date: 11/28/2019 CLINICAL DATA:  Left hand weakness for 1 week EXAM: LEFT HAND - 2 VIEW COMPARISON:  None. FINDINGS: Two views of the left hand were obtained. Severe degenerative changes of the radiocarpal joint with extensive osseous remodeling. Findings of SLAC wrist including widening of the  scapholunate interval with proximal migration of the capitate and associated osseous remodeling. Erosive changes to the distal ulna suggesting an underlying inflammatory arthropathy such as rheumatoid arthritis. Soft tissue swelling overlies the ulnar styloid. Relative preservation of the joint spaces of the MCP joints and interphalangeal joints of the hand. No acute fractures. IMPRESSION: 1. 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 2. Erosive changes to the ulnar styloid suggesting an underlying inflammatory arthropathy such as rheumatoid arthritis. Electronically Signed   By: Davina Poke D.O.   On: 11/28/2019 17:40   DG Chest Portable 1 View  Result Date: 11/28/2019 CLINICAL DATA:  Repeat chest x-ray with nipple markers. EXAM: PORTABLE CHEST 1 VIEW COMPARISON:  November 28, 2019 FINDINGS: The nodule projected over the right lower lobe on the previous study is a nipple shadow, marked with a nipple marker on this study. No pneumothorax. The lungs are otherwise clear. The heart, hila, mediastinum are unremarkable. No other acute abnormalities are identified. IMPRESSION: No active disease. Electronically Signed   By: Dorise Bullion III M.D   On: 11/28/2019 14:21   DG Chest Portable 1 View  Result Date: 11/28/2019 CLINICAL DATA:  Weakness EXAM: PORTABLE CHEST 1 VIEW COMPARISON:  12/01/2017 FINDINGS: The heart size and mediastinal contours are stable. Faint rounded 8 mm density projects over the peripheral aspect of the lower right hemithorax, favored to represent a nipple shadow. No focal airspace consolidation, pleural effusion, or pneumothorax. Advanced degenerative changes of the shoulders, right worse than left. IMPRESSION: 1. No active cardiopulmonary disease. 2. Faint 8 mm rounded density projects over the peripheral aspect of the lower right hemithorax, favored to represent a nipple shadow. Repeat chest radiograph with nipple markers could be performed to confirm.  Electronically Signed   By: Davina Poke D.O.   On: 11/28/2019 13:32   DG Knee Complete 4 Views Left  Result Date: 11/28/2019 CLINICAL DATA:  Left knee pain EXAM: LEFT KNEE - COMPLETE 4+ VIEW COMPARISON:  None. FINDINGS: No evidence of acute fracture. There is a old healed fracture of the proximal fibular metaphysis with bony callus formation. Osseous irregularity of the inferior pole of the patella with fragmentation, age indeterminate. Mild tricompartmental joint space narrowing. Small to moderate knee joint effusion. Soft tissue thickening in the prepatellar region and in the region of the patellar tendon. Severe vascular calcifications. IMPRESSION: 1. Osseous irregularity of the inferior pole of the patella with fragmentation. Findings are indeterminate and could reflect chronic posttraumatic or degenerative changes. Erosive/resorptive changes in the setting of osteomyelitis would have a similar appearance. If there is clinical concern for infection, arthrocentesis is recommended. 2. Small to moderate knee joint effusion, nonspecific. 3. Mild tricompartmental degenerative changes. 4. Chronic healed proximal fibular fracture. 5. Severe vascular calcifications. Electronically Signed   By: Davina Poke D.O.   On: 11/28/2019 13:39    Review of Systems  Constitutional: Positive for activity change.  Musculoskeletal: Positive for arthralgias, gait problem and joint swelling.  Neurological: Positive for weakness.   Blood pressure 118/77, pulse 84, temperature 99.3 F (37.4 C), temperature source Oral, resp. rate (!) 24, height 5' (1.524 m), weight 49.5 kg, SpO2 97 %. Physical Exam  Constitutional: He is oriented to person, place, and time. No distress.  Respiratory: No respiratory distress.  Musculoskeletal:        General: Tenderness present.     Cervical back: Normal range of motion.     Comments: Patient just returned from diagnostic imaging and was lying in his bed.  Left knee he does  have some swelling without large effusion.  Knee is diffusely tender.  Slight increased warmth.  No gross signs of infection.  Positive patellofemoral crepitus.  Neurological: He is alert and oriented to person, place, and time.  Psychiatric: He has a normal mood and affect.   *Procedure (left knee aspiration) After patient consent left lateral knee was prepped with Betadine and after using 2 cc 1% Xylocaine for local anesthetic I aspirated 5 cc of serous cloudy fluid from a superior lateral patellar approach.  I was unable to aspirate anymore fluid.  After this was done I then injected 10 cc of straight Marcaine 0.5% plain in hopes of giving patient some temporary pain relief.  NO Depo-Medrol was used.  Patient tolerated procedure well.  Fluid was sent for routine analysis..    Assessment/Plan: Left knee pain and swelling.  History of gout.  Fever  Joint fluid sent for cell count with differential/crystals, cultures.  These were all ordered stat.  Dr. Lorin Mercy will review labs and they return.  He will also review left knee MRI later today.  Continue present care. Case discussed with my attending Dr. Matilde Bash 11/29/2019, 12:05 PM   Knee aspirate positive for gout crystals. Message sent to Dr. Tyrell Antonio . Cultures in progress but likely gout is his pain generator.

## 2019-11-29 NOTE — Progress Notes (Signed)
OT Cancellation Note  Patient Details Name: Shawn Hendrix MRN: [id number] DOB: February 03, 1951   Cancelled Treatment:    Reason Eval/Treat Not Completed: Patient at procedure or test/ unavailable Will check back a schedule allows  Kari Baars, [place] [phone] Office- [phone], Edwena Felty D 11/29/2019, 1:19 PM

## 2019-11-29 NOTE — Progress Notes (Signed)
PROGRESS NOTE    Shawn Hendrix  [id number] DOB: 07-03-1951 DOA: 11/28/2019 PCP: Javier Docker, MD   Brief Narrative: 69 year old with past medical history significant for gout, hypertension, history of prostate cancer alcohol withdrawal and seizure who presents complaining of generalized weakness and deconditioning and left knee pain and hand pain.  Patient in the ED CT head without contrast no acute finding.  MRI negative for acute stroke. Patient did spike fever overnight temperature 101.  MRI of the left knee showed large effusion.  He underwent arthrocentesis on 4/12.  Cell count and synovial fluid 21,000, extracellular monosodium urate crystals present. Also developed episode of vomiting, CT abdomen and pelvis showed mild wall thickening and submucosal edema involving the ascending colon cold suggest an inflammatory or infectious colitis.   Assessment & Plan:   Active Problems:   Generalized weakness  1-sepsis: Patient present with leukocytosis, spiked fever at 101, tachypnea, tachycardia.  Source colitis versus left knee septic arthritis. Underwent left knee arthrocentesis: Synovial fluid 21,000 white blood cell, monosodium urate crystal present.  Follow cultures. Started on IV antibiotics: For unkown  Sepsis source  vancomycin, cefepime and Flagyl.  Might be able to stop vancomycin in 24 hours  2-hyponatremia: Due to poor oral intake: Improved with IV fluids.  3-left knee joint effusion and pain: Likely related to gout Synovial fluid: With 21,000 white blood cell, monosodium urate crystal.  Follow cultures Continue with IV antibiotics for now. With colchicine.  Added allopurinol. Appreciate Dr. Lorin Mercy  assistance. Meniscus tear by MRI per Ortho.  4-generalized weakness: Left side weakness ,failure to thrive: B12 deficiency.  Start supplements. Needs PT eval MRI negative for acute stroke. If no improvement of left hand arm weakness after treatment of gout might  need dedicated cervical spine MRI.  5-alcohol use: Risk for withdrawal: Continue with CIWA protocol.  6- iron deficiency anemia: Started on iron supplement. 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 Erosive changes to the ulnar styloid suggesting an underlying inflammatory arthropathy such as rheumatoid arthritis." Continue with treatment for gout. 8-hyperbilirubinemia: Monitor 9-magnesium Mia: Replete IV 10-hypertension: On Cardizem Estimated body mass index is 21.31 kg/m as calculated from the following:   Height as of this encounter: 5' (1.524 m).   Weight as of this encounter: 49.5 kg.   DVT prophylaxis: Heparin Code Status: Full code Family Communication: Daughter at bedside Disposition Plan:  Patient is from: Home Anticipated d/c date: 3 days depending on clinical condition, awaiting culture results from synovial fluid Barriers to d/c or necessity for inpatient status: Remain in the hospital for treatment of sepsis, IV antibiotics  Consultants:   Orthopedic  Procedures:   Arthrocentesis  Antimicrobials:  Comycin, cefepime, Flagyl  Subjective: Patient with poor feeling weak and tired.  Reported mild knee pain and left hand pain.  Objective: Vitals:   11/29/19 0204 11/29/19 0626 11/29/19 0745 11/29/19 0930  BP: 102/67 109/67 113/64 118/77  Pulse: 82 86 89 84  Resp: (!) 21  (!) 22 (!) 24  Temp: 99.6 F (37.6 C) (!) 101.3 F (38.5 C) (!) 100.4 F (38 C) 99.3 F (37.4 C)  TempSrc: Oral Oral Oral Oral  SpO2: 99% 94% 94% 97%  Weight:      Height:        Intake/Output Summary (Last 24 hours) at 11/29/2019 0954 Last data filed at 11/29/2019 0857 Gross per 24 hour  Intake 2246.02 ml  Output 1000 ml  Net 1246.02 ml   Filed Weights   11/28/19 1041 11/28/19 1648  Weight: 54.4 kg 49.5 kg    Examination:  General exam: Appears calm and comfortable  Respiratory system:  Clear to auscultation. Respiratory effort normal. Cardiovascular system: S1 & S2 heard, RRR. No JVD, murmurs, rubs, gallops or clicks. No pedal edema. Gastrointestinal system: Abdomen is nondistended, soft and nontender. No organomegaly or masses felt. Normal bowel sounds heard. Central nervous system: Alert and oriented.  Extremities: Symmetric 5 x 5 power. Skin: No rashes, lesions or ulcers    Data Reviewed: I have personally reviewed following labs and imaging studies  CBC: Recent Labs  Lab 11/28/19 1152 11/29/19 0520  WBC 13.2* 10.3  NEUTROABS 10.7* 7.9*  HGB 10.4* 8.7*  HCT 30.2* 25.7*  MCV 111.4* 112.2*  PLT 255 [id number]   Basic Metabolic Panel: Recent Labs  Lab 11/28/19 1152 11/29/19 0520  NA 129* 131*  K 3.9 3.9  CL 87* 93*  CO2 30 27  GLUCOSE 133* 112*  BUN 26* 23  CREATININE 1.13 1.02  CALCIUM 9.1 8.5*  MG  --  1.6*  PHOS  --  2.8   GFR: Estimated Creatinine Clearance: 47.9 mL/min (by C-G formula based on SCr of 1.02 mg/dL). Liver Function Tests: Recent Labs  Lab 11/28/19 1152 11/29/19 0520  AST 25 20  ALT 13 11  ALKPHOS 57 42  BILITOT 1.5* 1.3*  PROT 7.5 6.5  ALBUMIN 2.9* 2.6*   Recent Labs  Lab 11/28/19 1152  LIPASE 39   No results for input(s): AMMONIA in the last 168 hours. Coagulation Profile: Recent Labs  Lab 11/29/19 0733  INR 1.1   Cardiac Enzymes: No results for input(s): CKTOTAL, CKMB, CKMBINDEX, TROPONINI in the last 168 hours. BNP (last 3 results) No results for input(s): PROBNP in the last 8760 hours. HbA1C: No results for input(s): HGBA1C in the last 72 hours. CBG: Recent Labs  Lab 11/29/19 0756  GLUCAP 127*   Lipid Profile: No results for input(s): CHOL, HDL, LDLCALC, TRIG, CHOLHDL, LDLDIRECT in the last 72 hours. Thyroid Function Tests: Recent Labs    11/28/19 1953  TSH 0.515   Anemia Panel: Recent Labs    11/28/19 1953  VITAMINB12 62*  FOLATE 5.6*  FERRITIN 1,056*  TIBC 192*  IRON 18*  RETICCTPCT 3.8*    Sepsis Labs: Recent Labs  Lab 11/29/19 0520 11/29/19 0733  PROCALCITON 0.73  --   LATICACIDVEN  --  1.8    Recent Results (from the past 240 hour(s))  SARS CORONAVIRUS 2 (TAT 6-24 HRS) Nasopharyngeal Nasopharyngeal Swab     Status: None   Collection Time: 11/28/19 11:55 AM   Specimen: Nasopharyngeal Swab  Result Value Ref Range Status   SARS Coronavirus 2 NEGATIVE NEGATIVE Final    Comment: (NOTE) SARS-CoV-2 target nucleic acids are NOT DETECTED. The SARS-CoV-2 RNA is generally detectable in upper and lower respiratory specimens during the acute phase of infection. Negative results do not preclude SARS-CoV-2 infection, do not rule out co-infections with other pathogens, and should not be used as the sole basis for treatment or other patient management decisions. Negative results must be combined with clinical observations, patient history, and epidemiological information. The expected result is Negative. Fact Sheet for Patients: SugarRoll.be Fact Sheet for Healthcare Providers: https://www.woods-mathews.com/ This test is not yet approved or cleared by the [place] FDA and  has been authorized for detection and/or diagnosis of SARS-CoV-2 by FDA under an Emergency Use Authorization (EUA). This EUA will remain  in effect (meaning this test can be used) for the duration of the COVID-19 declaration under Section 56 4(b)(1) of the Act, 21 U.S.C. section 360bbb-3(b)(1), unless the authorization is terminated or revoked sooner. Performed at [facility], [place] [address] [place], [place] [address]   Culture, blood (routine x 2)     Status: None (Preliminary result)   Collection Time: 11/28/19  3:26 PM   Specimen: BLOOD  Result Value Ref Range Status   Specimen Description   Final    BLOOD RIGHT HAND Performed at [facility] [address], [place], [place] [address]    Special Requests   Final    BOTTLES  DRAWN AEROBIC ONLY Blood Culture results may not be optimal due to an inadequate volume of blood received in culture bottles Performed at [facility] [address] [place], [place] [address]    Culture   Final    NO GROWTH < 12 HOURS Performed at [facility] [address], [place], [place] [address]    Report Status PENDING  Incomplete         Radiology Studies: CT Head Wo Contrast  Result Date: 11/28/2019 CLINICAL DATA:  Stroke suspected. Bilateral hands and feet swelling over the past week. EXAM: CT HEAD WITHOUT CONTRAST TECHNIQUE: Contiguous axial images were obtained from the base of the skull through the vertex without intravenous contrast. COMPARISON:  12/02/2016 FINDINGS: Brain: Ventricles, cisterns and other CSF spaces are within normal. There is mild chronic ischemic microvascular disease. There is no mass, mass effect, shift of midline structures or acute hemorrhage. No evidence of acute infarction. Vascular: No hyperdense vessel or unexpected calcification. Skull: Normal. Negative for fracture or focal lesion. Sinuses/Orbits: Prosthetic right globe. Left orbit is unremarkable. Paranasal sinuses are clear. Other: None. IMPRESSION: No acute findings. Chronic ischemic microvascular disease. Electronically Signed   By: Marin Olp M.D.   On: 11/28/2019 13:52   MR BRAIN WO CONTRAST  Result Date: 11/28/2019 CLINICAL DATA:  69 year old male with possible stroke. Extremity swelling. Alcohol withdrawal seizures. EXAM: MRI HEAD WITHOUT CONTRAST TECHNIQUE: Multiplanar, multiecho pulse sequences of the brain and surrounding structures were obtained without intravenous contrast. COMPARISON:  Head CT earlier today. FINDINGS: Brain: No restricted diffusion to suggest acute infarction. No midline shift, mass effect, evidence of mass lesion, ventriculomegaly, extra-axial collection or acute intracranial hemorrhage. Cervicomedullary junction and pituitary are within  normal limits. Cerebral volume loss which seems fairly generalized. The parietal lobes may be relatively spared. Superimposed mild for age bilateral cerebral white matter T2 and FLAIR hyperintensity. No cortical encephalomalacia or chronic cerebral blood products identified. Signal in the deep gray nuclei, brainstem and cerebellum within normal limits. Vascular: Major intracranial vascular flow voids are preserved. Skull and upper cervical spine: Advanced cervical spine degeneration on series 5, image 13 with widespread mild degenerative cervical spinal stenosis suspected. Degenerative appearing sclerosis of the cervical vertebrae. Skull bone marrow signal appears normal. Sinuses/Orbits: Right orbital prosthesis. Otherwise negative orbits soft tissues. Paranasal sinuses and mastoids are stable and well pneumatized. Other: Visible internal auditory structures appear normal. There is a tiny forehead scalp lipoma in the midline on series 5, image 12. IMPRESSION: 1. No acute intracranial abnormality. 2. Brain volume loss seems fairly generalized with no definite areas of disproportionate involvement. Mild for age nonspecific white matter signal changes, most commonly due to small vessel disease. 3. 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 Electronically Signed   By: Lemmie Evens  [place] Crane M.D.   On: 11/28/2019 16:33   DG Hand 2 View Left  Result Date: 11/28/2019 CLINICAL DATA:  Left hand weakness for 1 week EXAM: LEFT HAND - 2 VIEW COMPARISON:  None. FINDINGS: Two views of the left hand were obtained. Severe degenerative changes of the radiocarpal joint with extensive osseous remodeling. Findings of SLAC wrist including widening of the scapholunate interval with proximal migration of the capitate and associated osseous remodeling. Erosive changes to the distal ulna suggesting an underlying inflammatory arthropathy such as rheumatoid arthritis. Soft tissue swelling overlies the ulnar  styloid. Relative preservation of the joint spaces of the MCP joints and interphalangeal joints of the hand. No acute fractures. IMPRESSION: 1. 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 2. Erosive changes to the ulnar styloid suggesting an underlying inflammatory arthropathy such as rheumatoid arthritis. Electronically Signed   By: Davina Poke D.O.   On: 11/28/2019 17:40   DG Chest Portable 1 View  Result Date: 11/28/2019 CLINICAL DATA:  Repeat chest x-ray with nipple markers. EXAM: PORTABLE CHEST 1 VIEW COMPARISON:  November 28, 2019 FINDINGS: The nodule projected over the right lower lobe on the previous study is a nipple shadow, marked with a nipple marker on this study. No pneumothorax. The lungs are otherwise clear. The heart, hila, mediastinum are unremarkable. No other acute abnormalities are identified. IMPRESSION: No active disease. Electronically Signed   By: Dorise Bullion III M.D   On: 11/28/2019 14:21   DG Chest Portable 1 View  Result Date: 11/28/2019 CLINICAL DATA:  Weakness EXAM: PORTABLE CHEST 1 VIEW COMPARISON:  12/01/2017 FINDINGS: The heart size and mediastinal contours are stable. Faint rounded 8 mm density projects over the peripheral aspect of the lower right hemithorax, favored to represent a nipple shadow. No focal airspace consolidation, pleural effusion, or pneumothorax. Advanced degenerative changes of the shoulders, right worse than left. IMPRESSION: 1. No active cardiopulmonary disease. 2. Faint 8 mm rounded density projects over the peripheral aspect of the lower right hemithorax, favored to represent a nipple shadow. Repeat chest radiograph with nipple markers could be performed to confirm. Electronically Signed   By: Davina Poke D.O.   On: 11/28/2019 13:32   DG Knee Complete 4 Views Left  Result Date: 11/28/2019 CLINICAL DATA:  Left knee pain EXAM: LEFT KNEE - COMPLETE 4+ VIEW COMPARISON:  None. FINDINGS: No evidence of acute fracture.  There is a old healed fracture of the proximal fibular metaphysis with bony callus formation. Osseous irregularity of the inferior pole of the patella with fragmentation, age indeterminate. Mild tricompartmental joint space narrowing. Small to moderate knee joint effusion. Soft tissue thickening in the prepatellar region and in the region of the patellar tendon. Severe vascular calcifications. IMPRESSION: 1. Osseous irregularity of the inferior pole of the patella with fragmentation. Findings are indeterminate and could reflect chronic posttraumatic or degenerative changes. Erosive/resorptive changes in the setting of osteomyelitis would have a similar appearance. If there is clinical concern for infection, arthrocentesis is recommended. 2. Small to moderate knee joint effusion, nonspecific. 3. Mild tricompartmental degenerative changes. 4. Chronic healed proximal fibular fracture. 5. Severe vascular calcifications. Electronically Signed   By: Davina Poke D.O.   On: 11/28/2019 13:39        Scheduled Meds: . colchicine  0.6 mg Oral BID  . cyanocobalamin  1,000 mcg Intramuscular Once  . diltiazem  240 mg Oral Daily  . feeding supplement (ENSURE ENLIVE)   Oral TID BM  . ferrous sulfate  325 mg Oral Q breakfast  . folic acid  1 mg Oral Daily  . heparin  5,000 Units Subcutaneous Q8H  . LORazepam  0-4 mg Intravenous Q6H   Or  . LORazepam  0-4 mg Oral Q6H  . [START ON 11/30/2019] LORazepam  0-4 mg Intravenous Q12H   Or  . [START ON 11/30/2019] LORazepam  0-4 mg Oral Q12H  . multivitamin  1 tablet Oral Daily  . pantoprazole  40 mg Oral Daily  . thiamine  100 mg Oral Daily   Or  . thiamine  100 mg Intravenous Daily  . [START ON 11/30/2019] vitamin B-12  1,000 mcg Oral Daily   Continuous Infusions: . sodium chloride 75 mL/hr at 11/29/19 0741  . ceFEPime (MAXIPIME) IV    . magnesium sulfate bolus IVPB    . metronidazole    . sodium chloride    . vancomycin 1,000 mg (11/29/19 0857)  .  vancomycin       LOS: 0 days    Time spent: 35 minutes    Mcguire Gasparyan A Lachelle Rissler, MD [facility]   If 7PM-7AM, please contact night-coverage [email or web address]  11/29/2019, 9:54 AM

## 2019-11-29 NOTE — Evaluation (Signed)
Physical Therapy Evaluation Patient Details Name: Shawn Hendrix MRN: [id number] DOB: 15-Aug-1951 Today's Date: 11/29/2019   History of Present Illness  69 yo male admitted with weakness, falls. L knee pain, FTT. Hx of gout, OA, seizures, ETOH use, prostate ca. MRI (+) L knee complex meniscus tear  Clinical Impression  On eval, pt required Total Assist +2 for mobility. Pt presents with global weakness, atrophied musculature, impaired balance, decreased activity tolerance. Generalized pain in UEs and LEs. MRI(+) for L complex meniscus tear. High fall risk with all mobility. Pt is currently unable to stand or ambulate. Will need SNF.     Follow Up Recommendations SNF    Equipment Recommendations  (TBD at next venue)    Recommendations for Other Services       Precautions / Restrictions Precautions Precautions: Fall Precaution Comments: high fall risk Restrictions Weight Bearing Restrictions: No      Mobility  Bed Mobility Overal bed mobility: Needs Assistance Bed Mobility: Rolling;Supine to Sit;Sit to Supine Rolling: Mod assist;+2 for physical assistance;+2 for safety/equipment   Supine to sit: Total assist;+2 for physical assistance;+2 for safety/equipment Sit to supine: Total assist;+2 for physical assistance;+2 for safety/equipment   General bed mobility comments: Assist for trunk and bil LEs. Utilized bedpad for scooting, positioning. Increased time.  Transfers                 General transfer comment: Attempted sit to stand x 2-pt unable to take any weight through LEs or clear bottom. Pt was unable to perform lateral scoot.  Ambulation/Gait                Stairs            Wheelchair Mobility    Modified Rankin (Stroke Patients Only)       Balance Overall balance assessment: Needs assistance;History of Falls Sitting-balance support: Bilateral upper extremity supported;Feet supported   Sitting balance - Comments: Pt sat at bedside for ~  5 mins with Min guard assist for static sitting balance. Poor dynamic balance with attempts at standing, scooting     Standing balance-Leahy Scale: Zero                               Pertinent Vitals/Pain Pain Assessment: Faces Faces Pain Scale: Hurts even more Pain Location: generalized including UEs/LEs Pain Descriptors / Indicators: Discomfort;Sore;Aching;Grimacing;Guarding Pain Intervention(s): Limited activity within patient's tolerance;Monitored during session;Repositioned    Home Living Family/patient expects to be discharged to:: Private residence Living Arrangements: Non-relatives/Friends   Type of Home: Apartment Home Access: Stairs to enter;Ramped entrance   Entrance Stairs-Number of Steps: stais in front; ramp in back Home Layout: One level Home Equipment: Cane - single point;Walker - 2 wheels      Prior Function Level of Independence: Independent with assistive device(s)         Comments: uses cane for ambulation     Hand Dominance        Extremity/Trunk Assessment   Upper Extremity Assessment Upper Extremity Assessment: Defer to OT evaluation    Lower Extremity Assessment Lower Extremity Assessment: RLE deficits/detail;LLE deficits/detail RLE Deficits / Details: hip flex, abd/add 2/5, DF/PF 2/5, knee ext 2-/5 with compensation from hip musculature LLE Deficits / Details: hip flex, abd/add 2-/5, DF/PF 2-/5, knee ext 1/5    Cervical / Trunk Assessment Cervical / Trunk Assessment: Kyphotic(scoliosis)  Communication   Communication: Expressive difficulties  Cognition Arousal/Alertness: Awake/alert Behavior During Therapy:  WFL for tasks assessed/performed Overall Cognitive Status: Within Functional Limits for tasks assessed                                 General Comments: sometimes difficult to understand speech      General Comments      Exercises     Assessment/Plan    PT Assessment Patient needs continued PT  services  PT Problem List Decreased strength;Decreased mobility;Decreased balance;Decreased activity tolerance;Decreased knowledge of use of DME;Pain;Decreased cognition;Decreased coordination       PT Treatment Interventions Gait training;DME instruction;Therapeutic activities;Therapeutic exercise;Patient/family education;Balance training;Functional mobility training    PT Goals (Current goals can be found in the Care Plan section)  Acute Rehab PT Goals Patient Stated Goal: to get strength back PT Goal Formulation: With patient Time For Goal Achievement: 12/13/19 Potential to Achieve Goals: Fair    Frequency Min 2X/week   Barriers to discharge        Co-evaluation               AM-PAC PT "6 Clicks" Mobility  Outcome Measure Help needed turning from your back to your side while in a flat bed without using bedrails?: Total Help needed moving from lying on your back to sitting on the side of a flat bed without using bedrails?: Total Help needed moving to and from a bed to a chair (including a wheelchair)?: Total Help needed standing up from a chair using your arms (e.g., wheelchair or bedside chair)?: Total Help needed to walk in hospital room?: Total Help needed climbing 3-5 steps with a railing? : Total 6 Click Score: 6    End of Session Equipment Utilized During Treatment: Gait belt Activity Tolerance: Patient tolerated treatment well Patient left: in bed;with call bell/phone within reach;with bed alarm set   PT Visit Diagnosis: Muscle weakness (generalized) (M62.81);History of falling (Z91.81);Adult, failure to thrive (R62.7)    Time: [id number] PT Time Calculation (min) (ACUTE ONLY): 36 min   Charges:   PT Evaluation $PT Eval Moderate Complexity: 1 Mod PT Treatments $Therapeutic Activity: 8-22 mins           Doreatha Massed, PT Acute Rehabilitation

## 2019-11-29 NOTE — Progress Notes (Signed)
Pharmacy Antibiotic Note  Shawn Hendrix is a 69 y.o. male admitted on 11/28/2019 with weakness and knee pain. This AM, WBC improving, but noted to have new fevers, and Pharmacy consulted for vancomycin and cefepime dosing.  Plan:  Vancomycin 1000 mg IV now, then 750 mg IV q24 hr (est AUC 459 based on SCr 1; Vd 0.72)  Measure vancomycin AUC at steady state as indicated  SCr q48 while on vanc  Cefepime 2 g IV q12 hr  Flagyl per MD, dosing appropriate  Questionable active gout; note this can also cause fevers; f/u gout workup  Height: 5' (152.4 cm) Weight: 49.5 kg (109 lb 2 oz) IBW/kg (Calculated) : 50  Temp (24hrs), Avg:100 F (37.8 C), Min:99 F (37.2 C), Max:101.3 F (38.5 C)  Recent Labs  Lab 11/28/19 1152 11/29/19 0520  WBC 13.2* 10.3  CREATININE 1.13 1.02    Estimated Creatinine Clearance: 47.9 mL/min (by C-G formula based on SCr of 1.02 mg/dL).    No Known Allergies  Antimicrobials this admission: 4/12 vancomycin >>  4/12 cefepime >>   Dose adjustments this admission: n/a  Microbiology results: 4/12 BCx: sent 4/11 UCx: sent  4/11 COVID neg  Thank you for allowing pharmacy to be a part of this patient's care.  Mell Guia A 11/29/2019 8:07 AM

## 2019-11-29 NOTE — Progress Notes (Signed)
Initial Nutrition Assessment  RD working remotely.   DOCUMENTATION CODES:   Not applicable  INTERVENTION:  - continue Ensure Enlive BID. - continue to encourage PO intakes.    NUTRITION DIAGNOSIS:   Inadequate oral intake related to acute illness as evidenced by per patient/family report.  GOAL:   Patient will meet greater than or equal to 90% of their needs  MONITOR:   PO intake, Supplement acceptance, Labs, Weight trends  REASON FOR ASSESSMENT:   Malnutrition Screening Tool, Consult Assessment of nutrition requirement/status, Poor PO  ASSESSMENT:   69 y.o. male with medical history of anemia, arthralgia, arthritis, gout, HTN, prostate cancer, generalized weakness, malnutrition of moderate degree, alcohol abuse (ongoing), alcohol withdrawal seizures, and other comorbidities. He presented due to generalized weakness and deconditioning associated with pain in his L knee and hand. Symptoms began ~1 week ago. During this time he has been too weak to get up so he has mainly been sitting in a chair and not eating. He typically drinks 1 bottle of wine/day but he stopped drinking d/t weakness. His girlfriend helps him at home.  Diet changed from Soft to NPO today at 0955; no intakes documented while on Soft diet. Patient out of the room to CT at the time of attempted visit. Per chart review, weight yesterday was 109 lb and PTA, the most recently documented weight was on 01/07/18 when he weighed 111 lb.   Per notes: - sepsis - hyponatremia d/t poor oral intake--improving - L knee pain thought to be 2/2 gout - generalized weakness, L-sided weakness, FTT---MRI negative for stroke - alcohol abuse with risk for withdrawal  - iron deficiency anemia    Labs reviewed; CBGs: 127 mg/dl, Na: 131 mmol/l, Cl: 93 mmol/l, Ca: 8.5 mg/dl, Mg: 1.6 mg/dl. Medications reviewed; 1000 mcg IM cyanocobalamin x1 dose 4/12, 325 mg ferrous sulfate/day, 2 g IV Mg sulfate x1 run 4/12, 1 tablet  prosight/day, 100 mg thiamine/day.  IVF; NS @ 75 ml/hr.   NUTRITION - FOCUSED PHYSICAL EXAM:  unable to complete at this time.   Diet Order:   Diet Order            Diet NPO time specified  Diet effective now              EDUCATION NEEDS:   No education needs have been identified at this time  Skin:  Skin Assessment: Reviewed RN Assessment  Last BM:  4/11  Height:   Ht Readings from Last 1 Encounters:  11/28/19 5' (1.524 m)    Weight:   Wt Readings from Last 1 Encounters:  11/28/19 49.5 kg    Ideal Body Weight:  47.7 kg  BMI:  Body mass index is 21.31 kg/m.  Estimated Nutritional Needs:   Kcal:  1485-1640 kcal  Protein:  75-90 grams  Fluid:  >/= 1.7 L/day     Jarome Matin, MS, RD, LDN, CNSC Inpatient Clinical Dietitian RD pager # available in AMION  After hours/weekend pager # available in [facility]

## 2019-11-29 NOTE — Progress Notes (Signed)
   11/28/19 2206  Assess: MEWS Score  Temp (!) 100.5 F (38.1 C)  BP 121/67  Pulse Rate (!) 103  Resp 17  SpO2 95 %  O2 Device Room Air  Assess: MEWS Score  MEWS Temp 1  MEWS Systolic 0  MEWS Pulse 1  MEWS RR 0  MEWS LOC 0  MEWS Score 2  MEWS Score Color Yellow  Assess: if the MEWS score is Yellow or Red  Were vital signs taken at a resting state? Yes  Focused Assessment Documented focused assessment  Early Detection of Sepsis Score *See Row Information* High  MEWS guidelines implemented *See Row Information* Yes  Treat  MEWS Interventions Administered prn meds/treatments  Take Vital Signs  Increase Vital Sign Frequency  Yellow: Q 2hr X 2 then Q 4hr X 2, if remains yellow, continue Q 4hrs  Escalate  MEWS: Escalate Yellow: discuss with charge nurse/RN and consider discussing with provider and RRT  Notify: Charge Nurse/RN  Name of Charge Nurse/RN Notified Mechele Claude, CN  Document  Patient Outcome Stabilized after interventions  Progress note created (see row info) Yes

## 2019-11-30 DIAGNOSIS — R531 Weakness: Secondary | ICD-10-CM | POA: Diagnosis not present

## 2019-11-30 DIAGNOSIS — A419 Sepsis, unspecified organism: Secondary | ICD-10-CM | POA: Diagnosis not present

## 2019-11-30 LAB — HEMOGLOBIN A1C
Hgb A1c MFr Bld: 4.6 % — ABNORMAL LOW (ref 4.8–5.6)
Mean Plasma Glucose: 85 mg/dL

## 2019-11-30 LAB — BASIC METABOLIC PANEL
Anion gap: 8 (ref 5–15)
BUN: 19 mg/dL (ref 8–23)
CO2: 23 mmol/L (ref 22–32)
Calcium: 7.9 mg/dL — ABNORMAL LOW (ref 8.9–10.3)
Chloride: 101 mmol/L (ref 98–111)
Creatinine, Ser: 0.91 mg/dL (ref 0.61–1.24)
GFR calc Af Amer: >60 mL/min (ref >60)
GFR calc non Af Amer: >60 mL/min (ref >60)
Glucose, Bld: 80 mg/dL (ref 70–99)
Potassium: 3.9 mmol/L (ref 3.5–5.1)
Sodium: 132 mmol/L — ABNORMAL LOW (ref 135–145)

## 2019-11-30 LAB — MAGNESIUM: Magnesium: 1.8 mg/dL (ref 1.7–2.4)

## 2019-11-30 LAB — PROCALCITONIN: Procalcitonin: 1.31 ng/mL

## 2019-11-30 LAB — GLUCOSE, CAPILLARY
Glucose-Capillary: 92 mg/dL (ref 70–99)
Glucose-Capillary: 113 mg/dL — ABNORMAL HIGH (ref 70–99)

## 2019-11-30 MED ORDER — MAGNESIUM SULFATE 2 GM/50ML IV SOLN
2.0000 g | Freq: Once | INTRAVENOUS | Status: AC
  Administered 2019-11-30: 15:00:00 2 g via INTRAVENOUS
  Filled 2019-11-30: qty 50

## 2019-11-30 NOTE — Progress Notes (Signed)
OT Cancellation Note  Patient Details Name: Shawn Hendrix MRN: [id number] DOB: [birth year]   Cancelled Treatment:     Attempted to see patient x2 today however patient sleeping heavily/unable to arouse. Will re-attempt 4/14 as schedule allows.  Delbert Phenix OT Pager: [place] 11/30/2019, 2:54 PM

## 2019-11-30 NOTE — Progress Notes (Signed)
PROGRESS NOTE    Shawn Hendrix  [id number] DOB: 1950/10/15 DOA: 11/28/2019 PCP: Javier Docker, MD   Brief Narrative: 69 year old with past medical history significant for gout, hypertension, history of prostate cancer alcohol withdrawal and seizure who presents complaining of generalized weakness and deconditioning and left knee pain and hand pain.  Patient in the ED CT head without contrast no acute finding.  MRI negative for acute stroke. Patient did spike fever overnight temperature 101.  MRI of the left knee showed large effusion.  He underwent arthrocentesis on 4/12.  Cell count and synovial fluid 21,000, extracellular monosodium urate crystals present. Also developed episode of vomiting, CT abdomen and pelvis showed mild wall thickening and submucosal edema involving the ascending colon cold suggest an inflammatory or infectious colitis.   Assessment & Plan:   Active Problems:   Generalized weakness  1-Sepsis: Patient present with leukocytosis, spiked fever at 101, tachypnea, tachycardia.  Probably source Colitis.  Underwent left knee arthrocentesis: Synovial fluid 21,000 white blood cell, monosodium urate crystal present.  Follow cultures. No growth in 24 hours. Will discontinue vancomycin.  Continue with Cefepime and flagyl.   2-Hyponatremia: Due to poor oral intake: Improved with IV fluids.  3-Left knee joint effusion and pain: Likely related to gout Synovial fluid: With 21,000 white blood cell, monosodium urate crystal.  Follow cultures ; no growth to date With colchicine.  Added allopurinol. Appreciate Dr. Lorin Mercy  assistance. Meniscus tear by MRI per Ortho.  4-Generalized weakness: Left side weakness ,failure to thrive: B12 deficiency.  Start supplements. Needs PT eval MRI negative for acute stroke. If no improvement of left hand arm weakness after treatment of gout might need dedicated cervical spine MRI.  5-Alcohol use: Risk for withdrawal: Continue  with CIWA protocol. Received ativan this morning, he is lethargic.   6- Iron deficiency anemia: Started on iron supplement. 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 Erosive changes to the ulnar styloid suggesting an underlying inflammatory arthropathy such as rheumatoid arthritis." Continue with treatment for gout. 8-hyperbilirubinemia: Monitor 9-Hypomagnesemia: Replete IV. Improved.  10-hypertension: On Cardizem Estimated body mass index is 21.31 kg/m as calculated from the following:   Height as of this encounter: 5' (1.524 m).   Weight as of this encounter: 49.5 kg.   DVT prophylaxis: Heparin Code Status: Full code Family Communication: Brother at bedside.  Disposition Plan:  Patient is from: Home Anticipated d/c date: 3 days depending on clinical condition, resolution of sepsis.  Barriers to d/c or necessity for inpatient status: Remain in the hospital for treatment of sepsis, IV antibiotics, lethargic this am.   Consultants:   Orthopedic  Procedures:   Arthrocentesis  Antimicrobials:  Comycin, cefepime, Flagyl  Subjective: He is lethargic this am, received 2 mg of IV ativan.   Objective: Vitals:   11/30/19 0001 11/30/19 0409 11/30/19 0800 11/30/19 1052  BP: 108/73 119/63 110/62 133/83  Pulse: 95 89 84 92  Resp: 18 18  16   Temp: 100 F (37.8 C) 98.7 F (37.1 C)  98.9 F (37.2 C)  TempSrc: Oral Oral  Oral  SpO2: 98% 98%  95%  Weight:      Height:        Intake/Output Summary (Last 24 hours) at 11/30/2019 1429 Last data filed at 11/30/2019 1100 Gross per 24 hour  Intake 1804.41 ml  Output 900 ml  Net 904.41 ml   Filed Weights   11/28/19 1041 11/28/19  1648  Weight: 54.4 kg 49.5 kg    Examination:  General exam: Lethargic Respiratory system: CTA Cardiovascular system: S 1, S 2 RRR Gastrointestinal system: BS present, soft, nt Central nervous system:  lethargic, moves extremities passively Extremities: no edema    Data Reviewed: I have personally reviewed following labs and imaging studies  CBC: Recent Labs  Lab 11/28/19 1152 11/29/19 0520  WBC 13.2* 10.3  NEUTROABS 10.7* 7.9*  HGB 10.4* 8.7*  HCT 30.2* 25.7*  MCV 111.4* 112.2*  PLT 255 [id number]   Basic Metabolic Panel: Recent Labs  Lab 11/28/19 1152 11/29/19 0520 11/30/19 0537  NA 129* 131* 132*  K 3.9 3.9 3.9  CL 87* 93* 101  CO2 30 27 23   GLUCOSE 133* 112* 80  BUN 26* 23 19  CREATININE 1.13 1.02 0.91  CALCIUM 9.1 8.5* 7.9*  MG  --  1.6* 1.8  PHOS  --  2.8  --    GFR: Estimated Creatinine Clearance: 53.6 mL/min (by C-G formula based on SCr of 0.91 mg/dL). Liver Function Tests: Recent Labs  Lab 11/28/19 1152 11/29/19 0520  AST 25 20  ALT 13 11  ALKPHOS 57 42  BILITOT 1.5* 1.3*  PROT 7.5 6.5  ALBUMIN 2.9* 2.6*   Recent Labs  Lab 11/28/19 1152  LIPASE 39   No results for input(s): AMMONIA in the last 168 hours. Coagulation Profile: Recent Labs  Lab 11/29/19 0733  INR 1.1   Cardiac Enzymes: No results for input(s): CKTOTAL, CKMB, CKMBINDEX, TROPONINI in the last 168 hours. BNP (last 3 results) No results for input(s): PROBNP in the last 8760 hours. HbA1C: Recent Labs    11/28/19 1953  HGBA1C 4.6*   CBG: Recent Labs  Lab 11/29/19 0756 11/30/19 0804 11/30/19 1308  GLUCAP 127* 92 113*   Lipid Profile: No results for input(s): CHOL, HDL, LDLCALC, TRIG, CHOLHDL, LDLDIRECT in the last 72 hours. Thyroid Function Tests: Recent Labs    11/28/19 1953  TSH 0.515   Anemia Panel: Recent Labs    11/28/19 1953  VITAMINB12 62*  FOLATE 5.6*  FERRITIN 1,056*  TIBC 192*  IRON 18*  RETICCTPCT 3.8*   Sepsis Labs: Recent Labs  Lab 11/29/19 0520 11/29/19 0733 11/29/19 0938 11/30/19 0537  PROCALCITON 0.73  --   --  1.31  LATICACIDVEN  --  1.8 1.6  --     Recent Results (from the past 240 hour(s))  Urine culture     Status: Abnormal    Collection Time: 11/28/19 11:29 AM   Specimen: Urine, Random  Result Value Ref Range Status   Specimen Description   Final    URINE, RANDOM Performed at [facility], [place] [address], [place], [place] [address]    Special Requests   Final    NONE Performed at [facility], [place] [address] [place], [place] [address]    Culture (A)  Final    <10,000 COLONIES/mL INSIGNIFICANT GROWTH Performed at [facility] [address] [place], [place] [address]    Report Status 11/29/2019 FINAL  Final  SARS CORONAVIRUS 2 (TAT 6-24 HRS) Nasopharyngeal Nasopharyngeal Swab     Status: None   Collection Time: 11/28/19 11:55 AM   Specimen: Nasopharyngeal Swab  Result Value Ref Range Status   SARS Coronavirus 2 NEGATIVE NEGATIVE Final    Comment: (NOTE) SARS-CoV-2 target nucleic acids are NOT DETECTED. The SARS-CoV-2 RNA is generally detectable in upper and lower respiratory specimens during the acute phase of infection.  Negative results do not preclude SARS-CoV-2 infection, do not rule out co-infections with other pathogens, and should not be used as the sole basis for treatment or other patient management decisions. Negative results must be combined with clinical observations, patient history, and epidemiological information. The expected result is Negative. Fact Sheet for Patients: SugarRoll.be Fact Sheet for Healthcare Providers: https://www.woods-mathews.com/ This test is not yet approved or cleared by the [place] FDA and  has been authorized for detection and/or diagnosis of SARS-CoV-2 by FDA under an Emergency Use Authorization (EUA). This EUA will remain  in effect (meaning this test can be used) for the duration of the COVID-19 declaration under Section 56 4(b)(1) of the Act, 21 U.S.C. section 360bbb-3(b)(1), unless the authorization is terminated or revoked sooner. Performed at [facility], [place] [address] [place], [place] [address]   Culture, blood (routine x 2)     Status: None (Preliminary result)   Collection Time: 11/28/19  3:26 PM   Specimen: BLOOD  Result Value Ref Range Status   Specimen Description   Final    BLOOD RIGHT HAND Performed at [facility] [address], [place], [place] [address]    Special Requests   Final    BOTTLES DRAWN AEROBIC ONLY Blood Culture results may not be optimal due to an inadequate volume of blood received in culture bottles Performed at [facility] [address] [place], [place] [address]    Culture   Final    NO GROWTH 2 DAYS Performed at [facility] [address] [place], [place] [address]    Report Status PENDING  Incomplete  Culture, blood (routine x 2)     Status: None (Preliminary result)   Collection Time: 11/29/19  5:20 AM   Specimen: BLOOD  Result Value Ref Range Status   Specimen Description   Final    BLOOD LEFT ARM Performed at [facility] [address], [place], [place] [address]    Special Requests   Final    BOTTLES DRAWN AEROBIC AND ANAEROBIC Blood Culture adequate volume Performed at [facility] [address], [place], [place] [address]    Culture   Final    NO GROWTH 1 DAY Performed at [facility], [place] [address], [place], [place] [address]    Report Status PENDING  Incomplete  Body fluid culture     Status: None (Preliminary result)   Collection Time: 11/29/19 12:02 PM   Specimen: Synovium  Result Value Ref Range Status   Specimen Description   Final    SYNOVIAL Performed at [facility] [address] [place], [place] [address]    Special Requests LOOK FOR GC  Final   Gram Stain   Final    MODERATE WBC PRESENT, PREDOMINANTLY PMN NO ORGANISMS SEEN    Culture   Final    NO GROWTH < 24 HOURS Performed at [facility], [place] [address] [place], [place]  [address]    Report Status PENDING  Incomplete         Radiology Studies: MR BRAIN WO CONTRAST  Result Date: 11/28/2019 CLINICAL DATA:  69 year old male with possible stroke. Extremity swelling. Alcohol withdrawal seizures. EXAM: MRI HEAD WITHOUT CONTRAST TECHNIQUE: Multiplanar, multiecho pulse sequences of the brain and surrounding structures were obtained without intravenous contrast. COMPARISON:  Head CT earlier today. FINDINGS: Brain: No restricted diffusion to suggest acute infarction. No midline shift, mass  effect, evidence of mass lesion, ventriculomegaly, extra-axial collection or acute intracranial hemorrhage. Cervicomedullary junction and pituitary are within normal limits. Cerebral volume loss which seems fairly generalized. The parietal lobes may be relatively spared. Superimposed mild for age bilateral cerebral white matter T2 and FLAIR hyperintensity. No cortical encephalomalacia or chronic cerebral blood products identified. Signal in the deep gray nuclei, brainstem and cerebellum within normal limits. Vascular: Major intracranial vascular flow voids are preserved. Skull and upper cervical spine: Advanced cervical spine degeneration on series 5, image 13 with widespread mild degenerative cervical spinal stenosis suspected. Degenerative appearing sclerosis of the cervical vertebrae. Skull bone marrow signal appears normal. Sinuses/Orbits: Right orbital prosthesis. Otherwise negative orbits soft tissues. Paranasal sinuses and mastoids are stable and well pneumatized. Other: Visible internal auditory structures appear normal. There is a tiny forehead scalp lipoma in the midline on series 5, image 12. IMPRESSION: 1. No acute intracranial abnormality. 2. Brain volume loss seems fairly generalized with no definite areas of disproportionate involvement. Mild for age nonspecific white matter signal changes, most commonly due to small vessel disease. 3. 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 Electronically Signed   By: Genevie Ann M.D.   On: 11/28/2019 16:33   CT ABDOMEN PELVIS W CONTRAST  Result Date: 11/29/2019 CLINICAL DATA:  Generalized weakness and vomiting. EXAM: CT ABDOMEN AND PELVIS WITH CONTRAST TECHNIQUE: Multidetector CT imaging of the abdomen and pelvis was performed using the standard protocol following bolus administration of intravenous contrast. CONTRAST:  192mL OMNIPAQUE IOHEXOL 300 MG/ML  SOLN COMPARISON:  01/16/2018 FINDINGS: Lower chest: The heart is within normal limits in size. No pericardial effusion. Coronary artery and aortic calcifications are noted. Moderate breathing motion artifact. Streaky bibasilar atelectasis but no infiltrates or pulmonary lesions. Hepatobiliary: No focal hepatic lesions or intrahepatic biliary dilatation. Diffusely thick walled gallbladder could be due to lack of distension or low albumin. The portal and hepatic veins are patent. No common bile duct dilatation. Pancreas: No mass, inflammation or ductal dilatation. Spleen: Normal size. No focal lesions. Adrenals/Urinary Tract: The adrenal glands are unremarkable. No renal, ureteral or bladder calculi or mass. Mild perinephric interstitial changes appears stable and likely due to prior inflammation. Stomach/Bowel: The stomach, duodenum, small bowel and colon are grossly normal. Mild wall thickening and submucosal edema involving the ascending colon could suggest an inflammatory or infectious colitis. The terminal ileum and appendix appear normal. Scattered colonic diverticulosis without findings for acute diverticulitis. Moderate stool noted in the sigmoid colon and rectum. Vascular/Lymphatic: Stable tortuosity and moderate atherosclerotic calcifications involving the abdominal aorta and iliac arteries but no aneurysm or dissection. The branch vessels are patent. The major venous structures are patent. No mesenteric or retroperitoneal mass or adenopathy.  Reproductive: Brachytherapy seeds are noted in the prostate gland. The seminal vesicles appear normal. Other: No pelvic mass or pelvic adenopathy. No free pelvic fluid collections. No inguinal mass or hernia. Musculoskeletal: No significant bony findings. IMPRESSION: 1. Mild wall thickening and submucosal edema involving the ascending colon could suggest an inflammatory or infectious colitis. 2. No other significant abdominal/pelvic findings, mass lesions or adenopathy. 3. Gallbladder wall thickening could be due to lack of distension/contraction. 4. Stable atherosclerotic calcifications involving the aorta and iliac arteries. Aortic Atherosclerosis (ICD10-I70.0). Electronically Signed   By: Marijo Sanes M.D.   On: 11/29/2019 11:38   DG Hand 2 View Left  Result Date: 11/28/2019 CLINICAL DATA:  Left hand weakness for 1 week EXAM: LEFT HAND - 2 VIEW COMPARISON:  None.  FINDINGS: Two views of the left hand were obtained. Severe degenerative changes of the radiocarpal joint with extensive osseous remodeling. Findings of SLAC wrist including widening of the scapholunate interval with proximal migration of the capitate and associated osseous remodeling. Erosive changes to the distal ulna suggesting an underlying inflammatory arthropathy such as rheumatoid arthritis. Soft tissue swelling overlies the ulnar styloid. Relative preservation of the joint spaces of the MCP joints and interphalangeal joints of the hand. No acute fractures. IMPRESSION: 1. 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 2. Erosive changes to the ulnar styloid suggesting an underlying inflammatory arthropathy such as rheumatoid arthritis. Electronically Signed   By: Davina Poke D.O.   On: 11/28/2019 17:40   MR KNEE LEFT WO CONTRAST  Result Date: 11/29/2019 CLINICAL DATA:  Painful swollen left knee. Possible septic arthritis. EXAM: MRI OF THE LEFT KNEE WITHOUT CONTRAST TECHNIQUE: Multiplanar, multisequence MR  imaging of the knee was performed. No intravenous contrast was administered. COMPARISON:  None. FINDINGS: Examination is quite limited due to patient motion and lack of IV contrast. MENISCI Medial meniscus: Complex tear involving the posterior horn and mid body region of the medial meniscus. Lateral meniscus:  Grossly intact. LIGAMENTS Cruciates:  Intact Collaterals:  Intact. MCL and pes anserine bursitis. CARTILAGE Patellofemoral:  Moderate degenerative chondrosis. Medial: Areas of full or near full-thickness cartilage loss, joint space narrowing and subchondral cystic change and edema. Lateral:  Moderate degenerative chondrosis. Joint:  Large joint effusion. Popliteal Fossa:  No popliteal mass or Baker's cyst. Extensor Mechanism: The patella retinacular structures are intact and the quadriceps and patellar tendons are intact. There is severe chronic appearing patellar tendinopathy with a markedly thickened tendon with intrasubstance interstitial tears and a fairly long segment longitudinal split type tear. Bones: Subchondral marrow edema mainly involving the medial tibial plateau likely due to the degenerative changes. Other: Grossly normal knee musculature. IMPRESSION: 1. Complex tear involving the posterior horn and mid body region of the medial meniscus. 2. Intact ligamentous structures and no acute bony findings. 3. Tricompartmental degenerative changes most significant in the medial compartment. 4. Large joint effusion. No obvious findings for osteomyelitis but if septic arthritis is a consideration joint aspiration should be considered. 5. Severe chronic appearing patellar tendinopathy with significant thickening and longitudinal split type tear. Electronically Signed   By: Marijo Sanes M.D.   On: 11/29/2019 12:15        Scheduled Meds: . allopurinol  100 mg Oral Daily  . colchicine  0.6 mg Oral BID  . diltiazem  240 mg Oral Daily  . feeding supplement (ENSURE ENLIVE)   Oral TID BM  . ferrous  sulfate  325 mg Oral Q breakfast  . folic acid  1 mg Oral Daily  . heparin  5,000 Units Subcutaneous Q8H  . LORazepam  0-4 mg Intravenous Q12H   Or  . LORazepam  0-4 mg Oral Q12H  . multivitamin  1 tablet Oral Daily  . pantoprazole  40 mg Oral Daily  . thiamine  100 mg Oral Daily   Or  . thiamine  100 mg Intravenous Daily  . vitamin B-12  1,000 mcg Oral Daily   Continuous Infusions: . sodium chloride Stopped (11/29/19 2124)  . ceFEPime (MAXIPIME) IV 2 g (11/30/19 1041)  . metronidazole 500 mg (11/30/19 1328)  . vancomycin Stopped (11/29/19 2336)     LOS: 1 day    Time spent: 35 minutes    Elmarie Shiley, MD [facility]  If 7PM-7AM, please contact night-coverage [email or web address]  11/30/2019, 2:29 PM

## 2019-12-01 DIAGNOSIS — R531 Weakness: Secondary | ICD-10-CM | POA: Diagnosis not present

## 2019-12-01 DIAGNOSIS — A419 Sepsis, unspecified organism: Principal | ICD-10-CM

## 2019-12-01 DIAGNOSIS — E876 Hypokalemia: Secondary | ICD-10-CM

## 2019-12-01 DIAGNOSIS — M109 Gout, unspecified: Secondary | ICD-10-CM | POA: Diagnosis present

## 2019-12-01 DIAGNOSIS — E538 Deficiency of other specified B group vitamins: Secondary | ICD-10-CM

## 2019-12-01 DIAGNOSIS — K529 Noninfective gastroenteritis and colitis, unspecified: Secondary | ICD-10-CM

## 2019-12-01 DIAGNOSIS — D519 Vitamin B12 deficiency anemia, unspecified: Secondary | ICD-10-CM

## 2019-12-01 DIAGNOSIS — I10 Essential (primary) hypertension: Secondary | ICD-10-CM

## 2019-12-01 DIAGNOSIS — F1010 Alcohol abuse, uncomplicated: Secondary | ICD-10-CM

## 2019-12-01 LAB — CBC WITH DIFFERENTIAL/PLATELET
Abs Immature Granulocytes: 0.17 10*3/uL — ABNORMAL HIGH (ref 0.00–0.07)
Basophils Absolute: 0.1 10*3/uL (ref 0.0–0.1)
Basophils Relative: 0 %
Eosinophils Absolute: 0.1 10*3/uL (ref 0.0–0.5)
Eosinophils Relative: 1 %
HCT: 23.7 % — ABNORMAL LOW (ref 39.0–52.0)
Hemoglobin: 8 g/dL — ABNORMAL LOW (ref 13.0–17.0)
Immature Granulocytes: 1 %
Lymphocytes Relative: 6 %
Lymphs Abs: 0.7 10*3/uL (ref 0.7–4.0)
MCH: 39 pg — ABNORMAL HIGH (ref 26.0–34.0)
MCHC: 33.8 g/dL (ref 30.0–36.0)
MCV: 115.6 fL — ABNORMAL HIGH (ref 80.0–100.0)
Monocytes Absolute: 1.3 10*3/uL — ABNORMAL HIGH (ref 0.1–1.0)
Monocytes Relative: 10 %
Neutro Abs: 9.9 10*3/uL — ABNORMAL HIGH (ref 1.7–7.7)
Neutrophils Relative %: 82 %
Platelets: 430 10*3/uL — ABNORMAL HIGH (ref 150–400)
RBC: 2.05 MIL/uL — ABNORMAL LOW (ref 4.22–5.81)
RDW: 14.8 % (ref 11.5–15.5)
WBC: 12.1 10*3/uL — ABNORMAL HIGH (ref 4.0–10.5)
nRBC: 0 % (ref 0.0–0.2)

## 2019-12-01 LAB — BASIC METABOLIC PANEL
Anion gap: 10 (ref 5–15)
BUN: 18 mg/dL (ref 8–23)
CO2: 21 mmol/L — ABNORMAL LOW (ref 22–32)
Calcium: 8.2 mg/dL — ABNORMAL LOW (ref 8.9–10.3)
Chloride: 102 mmol/L (ref 98–111)
Creatinine, Ser: 0.85 mg/dL (ref 0.61–1.24)
GFR calc Af Amer: >60 mL/min (ref >60)
GFR calc non Af Amer: >60 mL/min (ref >60)
Glucose, Bld: 118 mg/dL — ABNORMAL HIGH (ref 70–99)
Potassium: 3.3 mmol/L — ABNORMAL LOW (ref 3.5–5.1)
Sodium: 133 mmol/L — ABNORMAL LOW (ref 135–145)

## 2019-12-01 LAB — MAGNESIUM: Magnesium: 2 mg/dL (ref 1.7–2.4)

## 2019-12-01 LAB — GLUCOSE, CAPILLARY: Glucose-Capillary: 108 mg/dL — ABNORMAL HIGH (ref 70–99)

## 2019-12-01 MED ORDER — CYANOCOBALAMIN 1000 MCG/ML IJ SOLN
1000.0000 ug | Freq: Every day | INTRAMUSCULAR | Status: AC
  Administered 2019-12-02 – 2019-12-07 (×6): 1000 ug via SUBCUTANEOUS
  Filled 2019-12-01 (×6): qty 1

## 2019-12-01 MED ORDER — POTASSIUM CHLORIDE CRYS ER 20 MEQ PO TBCR
40.0000 meq | EXTENDED_RELEASE_TABLET | Freq: Once | ORAL | Status: AC
  Administered 2019-12-01: 40 meq via ORAL
  Filled 2019-12-01: qty 2

## 2019-12-01 NOTE — Evaluation (Signed)
Occupational Therapy Evaluation Patient Details Name: Shawn Hendrix MRN: [id number] DOB: October 05, 1950 Today's Date: 12/01/2019    History of Present Illness 69 yo male admitted with weakness, falls. L knee pain, FTT. Hx of gout, OA, seizures, ETOH use, prostate ca. MRI (+) L knee complex meniscus tear   Clinical Impression   Patient is a 69 year old male that lives with relatives in an apartment with ramp access. Due to difficulty comprehending speech, unsure if timeline of when weakness/inability to walk is accurate. Patient states about a week before coming to the hospital he wasn't able to move without assistance, before this was able to care for himself. Currently patient requires mod A for UB dressing/bathing, max to total A for LB ADLs and mod/max A for bed mobility. Will continue to follow to maximize patient safety/independence with self care.    Follow Up Recommendations  SNF    Equipment Recommendations  Other (comment)(defer to next venue)       Precautions / Restrictions Precautions Precautions: Fall Precaution Comments: high fall risk Restrictions Weight Bearing Restrictions: No      Mobility Bed Mobility Overal bed mobility: Needs Assistance Bed Mobility: Rolling;Sidelying to Sit;Sit to Sidelying Rolling: Mod assist Sidelying to sit: Mod assist;Max assist     Sit to sidelying: Mod assist;Max assist General bed mobility comments: cues for sequencing bed mobility throughout, assist to roll LEs to sidelying and to elevate trunk to sitting position.   Transfers Overall transfer level: Needs assistance               General transfer comment: deferred due to max difficulty attempting to weight shift in sitting to scoot to edge of bed and towards head of bed without max to total assist    Balance Overall balance assessment: Needs assistance;History of Falls Sitting-balance support: Bilateral upper extremity supported;Feet supported Sitting balance-Leahy  Scale: Poor Sitting balance - Comments: patient sat at edge of bed for approximately 10 minutes at supervision level, min A for dynamic sitting balance       Standing balance comment: deferred                           ADL either performed or assessed with clinical judgement   ADL Overall ADL's : Needs assistance/impaired Eating/Feeding: Set up;Supervision/ safety;Sitting Eating/Feeding Details (indicate cue type and reason): to hold cup and drink from straw, supervision due to tremulous/weak grip to hold cup Grooming: Wash/dry face;Set up;Bed level   Upper Body Bathing: Moderate assistance;Sitting   Lower Body Bathing: Maximal assistance;Total assistance;Sitting/lateral leans   Upper Body Dressing : Moderate assistance;Sitting   Lower Body Dressing: Maximal assistance;Total assistance;Sitting/lateral leans   Toilet Transfer: +2 for physical assistance;+2 for safety/equipment Toilet Transfer Details (indicate cue type and reason): did not attempt due to poor core stability, unable to weight shift to scoot to edge of bed, tremulous Toileting- Clothing Manipulation and Hygiene: Total assistance       Functional mobility during ADLs: +2 for safety/equipment;+2 for physical assistance General ADL Comments: patient requires increased assistance for self care tasks due to weakness, impaired balance, decreased activity tolerance and safety.                  Pertinent Vitals/Pain Pain Assessment: Faces Faces Pain Scale: Hurts even more Pain Location: L knee with bed mobility Pain Descriptors / Indicators: Grimacing Pain Intervention(s): Monitored during session     Hand Dominance Right   Extremity/Trunk Assessment Upper  Extremity Assessment Upper Extremity Assessment: Generalized weakness(B shoulder AROM flexion: approx 90 degrees )   Lower Extremity Assessment Lower Extremity Assessment: Defer to PT evaluation   Cervical / Trunk Assessment Cervical / Trunk  Assessment: Kyphotic   Communication Communication Communication: Expressive difficulties;Other (comment)(garbled speech)   Cognition Arousal/Alertness: Awake/alert Behavior During Therapy: WFL for tasks assessed/performed Overall Cognitive Status: No family/caregiver present to determine baseline cognitive functioning                                 General Comments: difficult to understand speech at times, does follow directions for mobility              Home Living Family/patient expects to be discharged to:: Private residence Living Arrangements: Other relatives;Other (Comment)(patient states brother, niece and another fam member) Available Help at Discharge: Family Type of Home: Apartment Home Access: Stairs to enter;Ramped entrance Entrance Stairs-Number of Steps: stais in front; ramp in back   Home Layout: One level               Home Equipment: Cane - single point;Walker - 2 wheels          Prior Functioning/Environment Level of Independence: Independent with assistive device(s)        Comments: uses cane for ambulation, patient reports being in bed a week before hospital        OT Problem List: Decreased strength;Decreased activity tolerance;Impaired balance (sitting and/or standing);Decreased safety awareness;Decreased knowledge of use of DME or AE;Pain      OT Treatment/Interventions: Self-care/ADL training;Therapeutic exercise;DME and/or AE instruction;Therapeutic activities;Patient/family education;Balance training    OT Goals(Current goals can be found in the care plan section) Acute Rehab OT Goals Patient Stated Goal: to get strength back OT Goal Formulation: With patient Time For Goal Achievement: 12/15/19 Potential to Achieve Goals: Good  OT Frequency: Min 2X/week    AM-PAC OT "6 Clicks" Daily Activity     Outcome Measure Help from another person eating meals?: A Little Help from another person taking care of personal  grooming?: A Little Help from another person toileting, which includes using toliet, bedpan, or urinal?: Total Help from another person bathing (including washing, rinsing, drying)?: A Lot Help from another person to put on and taking off regular upper body clothing?: A Lot Help from another person to put on and taking off regular lower body clothing?: Total 6 Click Score: 12   End of Session Nurse Communication: Mobility status  Activity Tolerance: Patient tolerated treatment well Patient left: in bed;with call bell/phone within reach;with bed alarm set  OT Visit Diagnosis: Unsteadiness on feet (R26.81);Other abnormalities of gait and mobility (R26.89);Muscle weakness (generalized) (M62.81);Pain Pain - Right/Left: Left Pain - part of body: Knee                Time: [id number] OT Time Calculation (min): 33 min Charges:  OT General Charges $OT Visit: 1 Visit OT Evaluation $OT Eval Moderate Complexity: 1 Mod OT Treatments $Self Care/Home Management : 23-37 mins  Delbert Phenix OT Pager: [place] 12/01/2019, 9:35 AM

## 2019-12-01 NOTE — Progress Notes (Addendum)
PROGRESS NOTE    Shawn Hendrix  [id number] DOB: [birth year] DOA: 11/28/2019 PCP: Javier Docker, MD    Chief Complaint  Patient presents with  . Foot Swelling  . Arm Swelling    hands    Brief Narrative:  69 year old with past medical history significant for gout, hypertension, history of prostate cancer alcohol withdrawal and seizure who presents complaining of generalized weakness and deconditioning and left knee pain and hand pain.  Patient in the ED CT head without contrast no acute finding.  MRI negative for acute stroke. Patient did spike fever overnight temperature 101.  MRI of the left knee showed large effusion.  He underwent arthrocentesis on 4/12.  Cell count and synovial fluid 21,000, extracellular monosodium urate crystals present. Also developed episode of vomiting, CT abdomen and pelvis showed mild wall thickening and submucosal edema involving the ascending colon cold suggest an inflammatory or infectious colitis.   Assessment & Plan:   Active Problems:   Generalized weakness   Acute left-sided weakness   Acute gouty arthropathy   Sepsis ([place])   Colitis   Folate deficiency   Alcohol abuse   Hypomagnesemia   Essential hypertension  #1 sepsis likely secondary to colitis Patient had presented with leukocytosis, fever with a temp of 101, tachypnea, tachycardia meeting criteria for sepsis.  CT abdomen and pelvis done concerning for mild wall thickness submucosal edema involving the ascending colon suggesting inflammatory versus infectious colitis.  Urinalysis nitrite negative leukocytes negative.  Urine cultures with insignificant growth.  Blood cultures with no growth to date.  Cultures from arthrocentesis pending with no growth to date.  Patient was on vancomycin that was discontinued.  Continue IV cefepime.  Follow.  2.  Acute gouty arthritis of the left knee/left knee joint effusion Chronic alcohol use likely playing a role.  Alcohol cessation  stressed to patient.  Patient seen in consultation by orthopedics.  Patient underwent arthrocentesis 11/29/2019 with synovial fluid showing 21,000 white blood cells, monosodium urate crystals.  Cultures pending with no growth to date.  Patient with clinical improvement on colchicine twice daily.  Allopurinol added.  Orthopedics following.  Supportive care.  3.  Meniscus tear per MRI Per orthopedics.  4.  Generalized weakness/left-sided weakness/failure to thrive/vitamin B12 deficiency/folate deficiency MRI negative for any acute abnormalities.  Left hand and left knee pain improving.  Continue folic acid 1 mg daily.  Change oral vitamin B12 supplementation to subcutaneously daily. PT/OT.  Needs skilled nursing facility when medically stable.  5.  Alcohol abuse Alcohol cessation stressed to patient.  Continue the Ativan withdrawal protocol.  Continue folic acid, multivitamin, thiamine.  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  Erosive changes to the ulnar styloid suggesting an underlying inflammatory arthropathy such as rheumatoid arthritis.  Patient currently being treated for an acute gouty arthritis.  Outpatient follow-up.  7.  Hypomagnesemia Repleted.  8.  Hypertension Continue Cardizem.  9.  Hyperbilirubinemia Was trending down.  Follow.  10.  Hypokalemia Replete.   DVT prophylaxis: Heparin Code Status: Full Family Communication: Updated patient.  No family at bedside. Disposition:   Status is: Inpatient    Dispo: The patient is from: Home              Anticipated d/c is to: SNF              Anticipated d/c date is: Likely to skilled nursing facility in the  next 2 to 3 days.              Patient currently improving slowly and clinically.  Likely to skilled nursing facility once course of antibiotics have been completed on oral antibiotics.        Consultants:   Orthopedics: Dr.  Lorin Mercy 11/29/2019    Procedures:   CT abdomen and pelvis 11/29/2019  CT head 11/28/2019  MRI brain 11/28/2019  MRI left knee 11/29/2019  Left knee aspiration/arthrocentesis 11/29/2019  Antimicrobials:   .  IV cefepime 11/29/2019>>>>>  IV Flagyl 4/12/ 2021x1 dose  IV vancomycin 11/29/2019>>> 11/30/2019   Subjective: Patient sitting up in bed.  States left knee pain improving.  States left hand pain improving.  Denies any significant shortness of breath.  Denies any significant withdrawal symptoms.  Patient with complaints of weakness.  Objective: Vitals:   12/01/19 0800 12/01/19 1318 12/01/19 1319 12/01/19 1400  BP:  115/82    Pulse: 72  85 82  Resp:      Temp:  99.4 F (37.4 C)    TempSrc:  Oral    SpO2:   96%   Weight:      Height:        Intake/Output Summary (Last 24 hours) at 12/01/2019 1959 Last data filed at 12/01/2019 1656 Gross per 24 hour  Intake 2123.02 ml  Output 1100 ml  Net 1023.02 ml   Filed Weights   11/28/19 1041 11/28/19 1648 12/01/19 0512  Weight: 54.4 kg 49.5 kg 54.4 kg    Examination:  General exam: Appears calm and comfortable  Respiratory system: Clear to auscultation. Respiratory effort normal. Cardiovascular system: S1 & S2 heard, RRR. No JVD, murmurs, rubs, gallops or clicks. No pedal edema. Gastrointestinal system: Abdomen is nondistended, soft and nontender. No organomegaly or masses felt. Normal bowel sounds heard. Central nervous system: Alert and oriented. No focal neurological deficits. Extremities: Left knee less tender to palpation.  Left hand less tender to palpation.  Skin: No rashes, lesions or ulcers Psychiatry: Judgement and insight appear normal. Mood & affect appropriate.     Data Reviewed: I have personally reviewed following labs and imaging studies  CBC: Recent Labs  Lab 11/28/19 1152 11/29/19 0520 12/01/19 0618  WBC 13.2* 10.3 12.1*  NEUTROABS 10.7* 7.9* 9.9*  HGB 10.4* 8.7* 8.0*  HCT 30.2* 25.7* 23.7*  MCV  111.4* 112.2* 115.6*  PLT 255 273 430*    Basic Metabolic Panel: Recent Labs  Lab 11/28/19 1152 11/29/19 0520 11/30/19 0537 12/01/19 0618  NA 129* 131* 132* 133*  K 3.9 3.9 3.9 3.3*  CL 87* 93* 101 102  CO2 30 27 23  21*  GLUCOSE 133* 112* 80 118*  BUN 26* 23 19 18   CREATININE 1.13 1.02 0.91 0.85  CALCIUM 9.1 8.5* 7.9* 8.2*  MG  --  1.6* 1.8 2.0  PHOS  --  2.8  --   --     GFR: Estimated Creatinine Clearance: 58 mL/min (by C-G formula based on SCr of 0.85 mg/dL).  Liver Function Tests: Recent Labs  Lab 11/28/19 1152 11/29/19 0520  AST 25 20  ALT 13 11  ALKPHOS 57 42  BILITOT 1.5* 1.3*  PROT 7.5 6.5  ALBUMIN 2.9* 2.6*    CBG: Recent Labs  Lab 11/29/19 0756 11/30/19 0804 11/30/19 1308 12/01/19 0743  GLUCAP 127* 92 113* 108*     Recent Results (from the past 240 hour(s))  Urine culture     Status: Abnormal   Collection Time: 11/28/19  11:29 AM   Specimen: Urine, Random  Result Value Ref Range Status   Specimen Description   Final    URINE, RANDOM Performed at [facility] [address], [place], [place] [address]    Special Requests   Final    NONE Performed at [facility], [place] [address] [place], [place] [address]    Culture (A)  Final    <10,000 COLONIES/mL INSIGNIFICANT GROWTH Performed at [facility] [address], [place], [place] [address]    Report Status 11/29/2019 FINAL  Final  SARS CORONAVIRUS 2 (TAT 6-24 HRS) Nasopharyngeal Nasopharyngeal Swab     Status: None   Collection Time: 11/28/19 11:55 AM   Specimen: Nasopharyngeal Swab  Result Value Ref Range Status   SARS Coronavirus 2 NEGATIVE NEGATIVE Final    Comment: (NOTE) SARS-CoV-2 target nucleic acids are NOT DETECTED. The SARS-CoV-2 RNA is generally detectable in upper and lower respiratory specimens during the acute phase of infection. Negative results do not preclude SARS-CoV-2 infection, do not rule out co-infections with other  pathogens, and should not be used as the sole basis for treatment or other patient management decisions. Negative results must be combined with clinical observations, patient history, and epidemiological information. The expected result is Negative. Fact Sheet for Patients: SugarRoll.be Fact Sheet for Healthcare Providers: https://www.woods-mathews.com/ This test is not yet approved or cleared by the [place] FDA and  has been authorized for detection and/or diagnosis of SARS-CoV-2 by FDA under an Emergency Use Authorization (EUA). This EUA will remain  in effect (meaning this test can be used) for the duration of the COVID-19 declaration under Section 56 4(b)(1) of the Act, 21 U.S.C. section 360bbb-3(b)(1), unless the authorization is terminated or revoked sooner. Performed at [facility], [place] [address] [place], [place] [address]   Culture, blood (routine x 2)     Status: None (Preliminary result)   Collection Time: 11/28/19  3:26 PM   Specimen: BLOOD  Result Value Ref Range Status   Specimen Description BLOOD RIGHT HAND  Final   Special Requests   Final    BOTTLES DRAWN AEROBIC ONLY Blood Culture results may not be optimal due to an inadequate volume of blood received in culture bottles Performed at [facility], [place] [address] [place], [place] [address]    Culture NO GROWTH 3 DAYS  Final   Report Status PENDING  Incomplete  Culture, blood (routine x 2)     Status: None (Preliminary result)   Collection Time: 11/29/19  5:20 AM   Specimen: BLOOD  Result Value Ref Range Status   Specimen Description BLOOD LEFT ARM  Final   Special Requests   Final    BOTTLES DRAWN AEROBIC AND ANAEROBIC Blood Culture adequate volume Performed at [facility] [address] [place], [place] [address]    Culture NO GROWTH 2 DAYS  Final   Report Status PENDING  Incomplete  Body fluid culture     Status:  None (Preliminary result)   Collection Time: 11/29/19 12:02 PM   Specimen: Synovium  Result Value Ref Range Status   Specimen Description   Final    SYNOVIAL Performed at [facility] [address] [place], [place] [address]    Special Requests LOOK FOR GC  Final   Gram Stain   Final    MODERATE WBC PRESENT, PREDOMINANTLY PMN NO ORGANISMS SEEN    Culture   Final  NO GROWTH 2 DAYS Performed at [facility], [place] [address] [place], [place] [address]    Report Status PENDING  Incomplete  Anaerobic culture     Status: None (Preliminary result)   Collection Time: 11/29/19 12:02 PM   Specimen: Synovium  Result Value Ref Range Status   Specimen Description   Final    SYNOVIAL Performed at [facility] [address], [place], [place] [address]    Special Requests   Final    NONE Performed at [facility], [place] [address], [place], [place] [address]    Culture   Final    NO ANAEROBES ISOLATED; CULTURE IN PROGRESS FOR 5 DAYS   Report Status PENDING  Incomplete         Radiology Studies: No results found.      Scheduled Meds: . allopurinol  100 mg Oral Daily  . colchicine  0.6 mg Oral BID  . diltiazem  240 mg Oral Daily  . feeding supplement (ENSURE ENLIVE)   Oral TID BM  . ferrous sulfate  325 mg Oral Q breakfast  . folic acid  1 mg Oral Daily  . heparin  5,000 Units Subcutaneous Q8H  . LORazepam  0-4 mg Intravenous Q12H   Or  . LORazepam  0-4 mg Oral Q12H  . multivitamin  1 tablet Oral Daily  . pantoprazole  40 mg Oral Daily  . thiamine  100 mg Oral Daily   Or  . thiamine  100 mg Intravenous Daily  . vitamin B-12  1,000 mcg Oral Daily   Continuous Infusions: . sodium chloride 75 mL/hr at 12/01/19 1544  . ceFEPime (MAXIPIME) IV 2 g (12/01/19 0954)  . metronidazole 500 mg (12/01/19 1545)     LOS: 2 days    Time spent: 35 minutes    Irine Seal, MD [facility]   To  contact the attending provider between 7A-7P or the covering provider during after hours 7P-7A, please log into the web site [email or web address] and access using universal [place] password for that web site. If you do not have the password, please call the hospital operator.  12/01/2019, 7:59 PM

## 2019-12-02 DIAGNOSIS — M109 Gout, unspecified: Secondary | ICD-10-CM | POA: Diagnosis not present

## 2019-12-02 DIAGNOSIS — A419 Sepsis, unspecified organism: Secondary | ICD-10-CM | POA: Diagnosis not present

## 2019-12-02 DIAGNOSIS — K529 Noninfective gastroenteritis and colitis, unspecified: Secondary | ICD-10-CM | POA: Diagnosis not present

## 2019-12-02 DIAGNOSIS — R531 Weakness: Secondary | ICD-10-CM | POA: Diagnosis not present

## 2019-12-02 LAB — COMPREHENSIVE METABOLIC PANEL
ALT: 17 U/L (ref 0–44)
AST: 40 U/L (ref 15–41)
Albumin: 2.3 g/dL — ABNORMAL LOW (ref 3.5–5.0)
Alkaline Phosphatase: 53 U/L (ref 38–126)
Anion gap: 8 (ref 5–15)
BUN: 14 mg/dL (ref 8–23)
CO2: 21 mmol/L — ABNORMAL LOW (ref 22–32)
Calcium: 8.5 mg/dL — ABNORMAL LOW (ref 8.9–10.3)
Chloride: 106 mmol/L (ref 98–111)
Creatinine, Ser: 0.77 mg/dL (ref 0.61–1.24)
GFR calc Af Amer: >60 mL/min (ref >60)
GFR calc non Af Amer: >60 mL/min (ref >60)
Glucose, Bld: 88 mg/dL (ref 70–99)
Potassium: 3.8 mmol/L (ref 3.5–5.1)
Sodium: 135 mmol/L (ref 135–145)
Total Bilirubin: 1 mg/dL (ref 0.3–1.2)
Total Protein: 6.2 g/dL — ABNORMAL LOW (ref 6.5–8.1)

## 2019-12-02 LAB — CBC WITH DIFFERENTIAL/PLATELET
Abs Immature Granulocytes: 0.23 10*3/uL — ABNORMAL HIGH (ref 0.00–0.07)
Basophils Absolute: 0.1 10*3/uL (ref 0.0–0.1)
Basophils Relative: 0 %
Eosinophils Absolute: 0.1 10*3/uL (ref 0.0–0.5)
Eosinophils Relative: 1 %
HCT: 26.6 % — ABNORMAL LOW (ref 39.0–52.0)
Hemoglobin: 8.5 g/dL — ABNORMAL LOW (ref 13.0–17.0)
Immature Granulocytes: 2 %
Lymphocytes Relative: 5 %
Lymphs Abs: 0.7 10*3/uL (ref 0.7–4.0)
MCH: 36.6 pg — ABNORMAL HIGH (ref 26.0–34.0)
MCHC: 32 g/dL (ref 30.0–36.0)
MCV: 114.7 fL — ABNORMAL HIGH (ref 80.0–100.0)
Monocytes Absolute: 1.5 10*3/uL — ABNORMAL HIGH (ref 0.1–1.0)
Monocytes Relative: 11 %
Neutro Abs: 10.8 10*3/uL — ABNORMAL HIGH (ref 1.7–7.7)
Neutrophils Relative %: 81 %
Platelets: 522 10*3/uL — ABNORMAL HIGH (ref 150–400)
RBC: 2.32 MIL/uL — ABNORMAL LOW (ref 4.22–5.81)
RDW: 14.9 % (ref 11.5–15.5)
WBC: 13.4 10*3/uL — ABNORMAL HIGH (ref 4.0–10.5)
nRBC: 0 % (ref 0.0–0.2)

## 2019-12-02 LAB — BODY FLUID CULTURE: Culture: NO GROWTH

## 2019-12-02 LAB — GLUCOSE, CAPILLARY: Glucose-Capillary: 74 mg/dL (ref 70–99)

## 2019-12-02 MED ORDER — SODIUM CHLORIDE 0.9 % IV SOLN
2.0000 g | Freq: Every day | INTRAVENOUS | Status: DC
  Administered 2019-12-02 – 2019-12-07 (×6): 2 g via INTRAVENOUS
  Filled 2019-12-02 (×6): qty 2

## 2019-12-02 NOTE — Care Management Important Message (Signed)
Important Message  Patient Details IM Letter given to Evette Cristal SW Case Manager to present to the Patient Name: Shawn Hendrix MRN: [id number] Date of Birth: May 13, 1951   Medicare Important Message Given:  Yes     Kerin Salen 12/02/2019, 11:33 AM

## 2019-12-02 NOTE — Progress Notes (Signed)
PROGRESS NOTE    Shawn Hendrix  [id number] DOB: 03-Apr-1951 DOA: 11/28/2019 PCP: Javier Docker, MD    Chief Complaint  Patient presents with  . Foot Swelling  . Arm Swelling    hands    Brief Narrative:  69 year old with past medical history significant for gout, hypertension, history of prostate cancer alcohol withdrawal and seizure who presents complaining of generalized weakness and deconditioning and left knee pain and hand pain.  Patient in the ED CT head without contrast no acute finding.  MRI negative for acute stroke. Patient did spike fever overnight temperature 101.  MRI of the left knee showed large effusion.  He underwent arthrocentesis on 4/12.  Cell count and synovial fluid 21,000, extracellular monosodium urate crystals present. Also developed episode of vomiting, CT abdomen and pelvis showed mild wall thickening and submucosal edema involving the ascending colon cold suggest an inflammatory or infectious colitis.   Assessment & Plan:   Active Problems:   Generalized weakness   Acute left-sided weakness   Acute gouty arthropathy   Sepsis ([place])   Colitis   Folate deficiency   Alcohol abuse   Hypomagnesemia   Essential hypertension  1 sepsis likely secondary to colitis Patient had presented with leukocytosis, fever with a temp of 101, tachypnea, tachycardia meeting criteria for sepsis.  CT abdomen and pelvis done concerning for mild wall thickness submucosal edema involving the ascending colon suggesting inflammatory versus infectious colitis.  Urinalysis with nitrite negative, leukocytes negative.  Urine cultures with insignificant growth.  Blood cultures with no growth to date.  Cultures from arthrocentesis pending with no growth to date.  Patient was on vancomycin that was discontinued.  Narrowed from IV cefepime to IV Rocephin.  Continue IV Flagyl.  Follow.  2.  Acute gouty arthritis of the left knee/left knee joint effusion Chronic alcohol use  likely playing a role.  Alcohol cessation stressed to patient.  Patient seen in consultation by orthopedics.  Patient underwent arthrocentesis 11/29/2019 with synovial fluid showing 21,000 white blood cells, monosodium urate crystals.  Cultures pending with no growth to date.  Patient improving clinically on colchicine 0.6 mg twice daily which we will continue for now.  Continue allopurinol.  Was seen by orthopedics.  Supportive care.   3.  Meniscus tear per MRI Per orthopedics.  4.  Generalized weakness/left-sided weakness/failure to thrive/vitamin B12 deficiency/folate deficiency MRI negative for any acute abnormalities.  Left hand and left knee pain improving.  Continue folic acid 1 mg daily.  Continue vitamin B12 subcutaneously daily x6 more days, and then weekly, and then monthly.  Could also potentially transition to oral vitamin B12 supplementation on discharge.  PT/OT.  Needs SNF.   5.  Alcohol abuse Alcohol cessation stressed to patient.  Continue the Ativan withdrawal protocol.  Continue folic acid, multivitamin, thiamine.  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  Erosive changes to the ulnar styloid suggesting an underlying inflammatory arthropathy such as rheumatoid arthritis.  Patient currently being treated for an acute gouty arthritis.  Outpatient follow-up.  7.  Hypomagnesemia Repleted.  8.  Hypertension Blood pressure stable.  Continue Cardizem.  9.  Hyperbilirubinemia Trending down. Follow.  10.  Hypokalemia Repleted.   DVT prophylaxis: Heparin Code Status: Full Family Communication: Updated patient.  No family at bedside. Disposition:   Status is: Inpatient    Dispo: The patient is from: Home  Anticipated d/c is to: SNF              Anticipated d/c date is: Likely to skilled nursing facility in the next 2-3 days.              Patient currently improving slowly  and clinically.  Likely to skilled nursing facility once course of antibiotics have been completed on oral antibiotics.        Consultants:   Orthopedics: Dr. Lorin Mercy 11/29/2019    Procedures:   CT abdomen and pelvis 11/29/2019  CT head 11/28/2019  MRI brain 11/28/2019  MRI left knee 11/29/2019  Left knee aspiration/arthrocentesis 11/29/2019  Antimicrobials:   .  IV cefepime 11/29/2019>>>>> 12/02/2019  IV Flagyl 11/29/2019>>>>>>>  IV vancomycin 11/29/2019>>> 11/30/2019  IV Rocephin 12/02/2019   Subjective: Patient sitting up in bed.  Tolerating full liquid diet.  Denies any chest pain.  Shortness of breath improving.  Left knee and left hand pain improving.  Denies any abdominal pain.  Denies any significant diarrhea.  Objective: Vitals:   12/01/19 1400 12/01/19 2004 12/02/19 0509 12/02/19 0534  BP:  133/83 128/71   Pulse: 82 83 75   Resp:  20 20   Temp:  98.3 F (36.8 C) 98.8 F (37.1 C)   TempSrc:  Oral Oral   SpO2:  100% 98%   Weight:    57.2 kg  Height:        Intake/Output Summary (Last 24 hours) at 12/02/2019 1204 Last data filed at 12/02/2019 [id number] Gross per 24 hour  Intake 3060.88 ml  Output 1550 ml  Net 1510.88 ml   Filed Weights   11/28/19 1648 12/01/19 0512 12/02/19 0534  Weight: 49.5 kg 54.4 kg 57.2 kg    Examination:  General exam: NAD. Respiratory system: CTAB anterior lung fields.  No wheezing, no crackles, no rhonchi.  Normal respiratory effort. Cardiovascular system: Regular rate rhythm no murmurs rubs or gallops.  No JVD.  No lower extremity edema. Gastrointestinal system: Abdomen is soft, nontender, nondistended, positive bowel sounds.  No rebound.  No guarding. Central nervous system: Alert and oriented. No focal neurological deficits. Extremities: Left knee less tender to palpation.  Left hand less tender to palpation.  Skin: No rashes, lesions or ulcers Psychiatry: Judgement and insight appear normal. Mood & affect appropriate.      Data Reviewed: I have personally reviewed following labs and imaging studies  CBC: Recent Labs  Lab 11/28/19 1152 11/29/19 0520 12/01/19 0618 12/02/19 0537  WBC 13.2* 10.3 12.1* 13.4*  NEUTROABS 10.7* 7.9* 9.9* 10.8*  HGB 10.4* 8.7* 8.0* 8.5*  HCT 30.2* 25.7* 23.7* 26.6*  MCV 111.4* 112.2* 115.6* 114.7*  PLT 255 273 430* 522*    Basic Metabolic Panel: Recent Labs  Lab 11/28/19 1152 11/29/19 0520 11/30/19 0537 12/01/19 0618 12/02/19 0537  NA 129* 131* 132* 133* 135  K 3.9 3.9 3.9 3.3* 3.8  CL 87* 93* 101 102 106  CO2 30 27 23  21* 21*  GLUCOSE 133* 112* 80 118* 88  BUN 26* 23 19 18 14   CREATININE 1.13 1.02 0.91 0.85 0.77  CALCIUM 9.1 8.5* 7.9* 8.2* 8.5*  MG  --  1.6* 1.8 2.0  --   PHOS  --  2.8  --   --   --     GFR: Estimated Creatinine Clearance: 61.6 mL/min (by C-G formula based on SCr of 0.77 mg/dL).  Liver Function Tests: Recent Labs  Lab 11/28/19 1152 11/29/19 0520 12/02/19 0537  AST 25 20  40  ALT 13 11 17   ALKPHOS 57 42 53  BILITOT 1.5* 1.3* 1.0  PROT 7.5 6.5 6.2*  ALBUMIN 2.9* 2.6* 2.3*    CBG: Recent Labs  Lab 11/29/19 0756 11/30/19 0804 11/30/19 1308 12/01/19 0743 12/02/19 0741  GLUCAP 127* 92 113* 108* 74     Recent Results (from the past 240 hour(s))  Urine culture     Status: Abnormal   Collection Time: 11/28/19 11:29 AM   Specimen: Urine, Random  Result Value Ref Range Status   Specimen Description   Final    URINE, RANDOM Performed at [facility], [place] [address], [place], [place] [address]    Special Requests   Final    NONE Performed at [facility], [place] [address], [place], [place] [address]    Culture (A)  Final    <10,000 COLONIES/mL INSIGNIFICANT GROWTH Performed at [facility] [address], [place], [place] [address]    Report Status 11/29/2019 FINAL  Final  SARS CORONAVIRUS 2 (TAT 6-24 HRS) Nasopharyngeal Nasopharyngeal Swab     Status: None   Collection  Time: 11/28/19 11:55 AM   Specimen: Nasopharyngeal Swab  Result Value Ref Range Status   SARS Coronavirus 2 NEGATIVE NEGATIVE Final    Comment: (NOTE) SARS-CoV-2 target nucleic acids are NOT DETECTED. The SARS-CoV-2 RNA is generally detectable in upper and lower respiratory specimens during the acute phase of infection. Negative results do not preclude SARS-CoV-2 infection, do not rule out co-infections with other pathogens, and should not be used as the sole basis for treatment or other patient management decisions. Negative results must be combined with clinical observations, patient history, and epidemiological information. The expected result is Negative. Fact Sheet for Patients: SugarRoll.be Fact Sheet for Healthcare Providers: https://www.woods-mathews.com/ This test is not yet approved or cleared by the [place] FDA and  has been authorized for detection and/or diagnosis of SARS-CoV-2 by FDA under an Emergency Use Authorization (EUA). This EUA will remain  in effect (meaning this test can be used) for the duration of the COVID-19 declaration under Section 56 4(b)(1) of the Act, 21 U.S.C. section 360bbb-3(b)(1), unless the authorization is terminated or revoked sooner. Performed at [facility], [place] [address] [place], [place] [address]   Culture, blood (routine x 2)     Status: None (Preliminary result)   Collection Time: 11/28/19  3:26 PM   Specimen: BLOOD  Result Value Ref Range Status   Specimen Description BLOOD RIGHT HAND  Final   Special Requests   Final    BOTTLES DRAWN AEROBIC ONLY Blood Culture results may not be optimal due to an inadequate volume of blood received in culture bottles Performed at [facility], [place] [address], [place], [place] [address]    Culture NO GROWTH 3 DAYS  Final   Report Status PENDING  Incomplete  Culture, blood (routine x 2)     Status: None (Preliminary result)    Collection Time: 11/29/19  5:20 AM   Specimen: BLOOD  Result Value Ref Range Status   Specimen Description BLOOD LEFT ARM  Final   Special Requests   Final    BOTTLES DRAWN AEROBIC AND ANAEROBIC Blood Culture adequate volume Performed at [place] [address] [place], [place] [address]    Culture NO GROWTH 2 DAYS  Final   Report Status PENDING  Incomplete  Body fluid culture     Status: None   Collection  Time: 11/29/19 12:02 PM   Specimen: Synovium  Result Value Ref Range Status   Specimen Description   Final    SYNOVIAL Performed at [facility] [address], [place], [place] [address]    Special Requests LOOK FOR GC  Final   Gram Stain   Final    MODERATE WBC PRESENT, PREDOMINANTLY PMN NO ORGANISMS SEEN    Culture   Final    NO GROWTH 3 DAYS Performed at [facility], [place] [address] [place], [place] [address]    Report Status 12/02/2019 FINAL  Final  Anaerobic culture     Status: None (Preliminary result)   Collection Time: 11/29/19 12:02 PM   Specimen: Synovium  Result Value Ref Range Status   Specimen Description   Final    SYNOVIAL Performed at [place] [address], [place], [place] [address]    Special Requests   Final    NONE Performed at [facility], [place] [address] [place],  [address]    Culture   Final    NO ANAEROBES ISOLATED; CULTURE IN PROGRESS FOR 5 DAYS   Report Status PENDING  Incomplete         Radiology Studies: No results found.      Scheduled Meds: . allopurinol  100 mg Oral Daily  . colchicine  0.6 mg Oral BID  . cyanocobalamin  1,000 mcg Subcutaneous Daily  . diltiazem  240 mg Oral Daily  . feeding supplement (ENSURE ENLIVE)   Oral TID BM  . ferrous sulfate  325 mg Oral Q breakfast  . folic acid  1 mg Oral Daily  . heparin  5,000 Units Subcutaneous Q8H  . LORazepam  0-4 mg Intravenous Q12H   Or  . LORazepam  0-4 mg Oral  Q12H  . multivitamin  1 tablet Oral Daily  . pantoprazole  40 mg Oral Daily  . thiamine  100 mg Oral Daily   Or  . thiamine  100 mg Intravenous Daily   Continuous Infusions: . sodium chloride 75 mL/hr at 12/01/19 1544  . cefTRIAXone (ROCEPHIN)  IV 2 g (12/02/19 1048)  . metronidazole 500 mg (12/02/19 [id number])     LOS: 3 days    Time spent: 35 minutes    Irine Seal, MD [facility]   To contact the attending provider between 7A-7P or the covering provider during after hours 7P-7A, please log into the web site [email or web address] and access using universal [place] password for that web site. If you do not have the password, please call the hospital operator.  12/02/2019, 12:04 PM

## 2019-12-02 NOTE — Progress Notes (Signed)
Physical Therapy Treatment Patient Details Name: Shawn Hendrix MRN: [id number] DOB: 29-Nov-1950 Today's Date: 12/02/2019    History of Present Illness 69 yo male admitted with weakness, falls. L knee pain, FTT. Hx of gout, OA, seizures, ETOH use, prostate ca. MRI (+) L knee complex meniscus tear    PT Comments    Pt agreeable to get to chair due to wanting to eat lunch. Verbal cues for hand placement when coming to EOB requiring mod assist for physical assistance to bring BLE and trunk upright. Pt able to sit EOB and scoot towards HOB 1-2 inches with close supervision. Pt performs squat pivot transfer with mod assist x2, drop arm on chair lowered and shakiness noted when in standing; pt unable to come fully upright but follows commands for hand placement and pivoting bil feet to complete transfer safely with assistance. Pt continues to be difficult to understand due to mumbled speech, but follows commands appropriately and appreciate of therapy. Session ended due to lunch in room; pt tolerates remaining up in chair, chair alarm on, all needs in reach and nurse tech in room. Patient will benefit from continued physical therapy in hospital and recommended venue below to increase strength, balance, endurance for safe ADLs and gait.   Follow Up Recommendations  SNF     Equipment Recommendations  (TBD at next venue)    Recommendations for Other Services       Precautions / Restrictions Precautions Precautions: Fall Precaution Comments: high fall risk Restrictions Weight Bearing Restrictions: No    Mobility  Bed Mobility Overal bed mobility: Needs Assistance Bed Mobility: Supine to Sit     Supine to sit: Mod assist     General bed mobility comments: mod assist to bring BLE to EOB and to upright trunk, cues for hand placement to improve independence  Transfers Overall transfer level: Needs assistance Equipment used: 2 person hand held assist Transfers: Squat Pivot Transfers     Squat pivot transfers: Mod assist;+2 physical assistance     General transfer comment: squat pivot due to inability stand fully upright, but able to clear bottom from chair, assistance on either side assisting pt over to chair, verbal cues for hand placement and pivoting feet  Ambulation/Gait        Stairs             Wheelchair Mobility    Modified Rankin (Stroke Patients Only)       Balance Overall balance assessment: Needs assistance;History of Falls Sitting-balance support: Bilateral upper extremity supported;Feet supported Sitting balance-Leahy Scale: Fair Sitting balance - Comments: able to shift weight and scoot towards HOB with close SUPV, but fatigues   Standing balance support: During functional activity;Bilateral upper extremity supported Standing balance-Leahy Scale: Zero Standing balance comment: max assist for squat pivot without fully upright posture           Cognition Arousal/Alertness: Awake/alert Behavior During Therapy: WFL for tasks assessed/performed Overall Cognitive Status: No family/caregiver present to determine baseline cognitive functioning                  General Comments: mumbles speech, follows directions appropriately      Exercises      General Comments        Pertinent Vitals/Pain Pain Assessment: Faces Faces Pain Scale: Hurts a little bit Pain Location: BLE with bed mobility Pain Descriptors / Indicators: Grimacing Pain Intervention(s): Limited activity within patient's tolerance;Monitored during session;Repositioned    Home Living  Prior Function            PT Goals (current goals can now be found in the care plan section) Acute Rehab PT Goals Patient Stated Goal: to get strength back PT Goal Formulation: With patient Time For Goal Achievement: 12/13/19 Potential to Achieve Goals: Fair Progress towards PT goals: Progressing toward goals    Frequency    Min  2X/week      PT Plan Current plan remains appropriate    Co-evaluation              AM-PAC PT "6 Clicks" Mobility   Outcome Measure  Help needed turning from your back to your side while in a flat bed without using bedrails?: A Lot Help needed moving from lying on your back to sitting on the side of a flat bed without using bedrails?: A Lot Help needed moving to and from a bed to a chair (including a wheelchair)?: Total Help needed standing up from a chair using your arms (e.g., wheelchair or bedside chair)?: Total Help needed to walk in hospital room?: Total Help needed climbing 3-5 steps with a railing? : Total 6 Click Score: 8    End of Session   Activity Tolerance: Patient tolerated treatment well Patient left: in chair;with call bell/phone within reach;with chair alarm set;with nursing/sitter in room Nurse Communication: Mobility status PT Visit Diagnosis: Muscle weakness (generalized) (M62.81);History of falling (Z91.81);Adult, failure to thrive (R62.7)     Time: 1250-1300 PT Time Calculation (min) (ACUTE ONLY): 10 min  Charges:  $Therapeutic Activity: 8-22 mins                      Tori Delpha Perko PT, DPT 12/02/19, 1:15 PM [phone]

## 2019-12-03 DIAGNOSIS — R531 Weakness: Secondary | ICD-10-CM | POA: Diagnosis not present

## 2019-12-03 DIAGNOSIS — A419 Sepsis, unspecified organism: Secondary | ICD-10-CM | POA: Diagnosis not present

## 2019-12-03 DIAGNOSIS — M109 Gout, unspecified: Secondary | ICD-10-CM | POA: Diagnosis not present

## 2019-12-03 DIAGNOSIS — K529 Noninfective gastroenteritis and colitis, unspecified: Secondary | ICD-10-CM | POA: Diagnosis not present

## 2019-12-03 LAB — MAGNESIUM: Magnesium: 1.3 mg/dL — ABNORMAL LOW (ref 1.7–2.4)

## 2019-12-03 LAB — CBC
HCT: 24 % — ABNORMAL LOW (ref 39.0–52.0)
Hemoglobin: 7.9 g/dL — ABNORMAL LOW (ref 13.0–17.0)
MCH: 36.7 pg — ABNORMAL HIGH (ref 26.0–34.0)
MCHC: 32.9 g/dL (ref 30.0–36.0)
MCV: 111.6 fL — ABNORMAL HIGH (ref 80.0–100.0)
Platelets: 522 10*3/uL — ABNORMAL HIGH (ref 150–400)
RBC: 2.15 MIL/uL — ABNORMAL LOW (ref 4.22–5.81)
RDW: 14.6 % (ref 11.5–15.5)
WBC: 11.7 10*3/uL — ABNORMAL HIGH (ref 4.0–10.5)
nRBC: 0 % (ref 0.0–0.2)

## 2019-12-03 LAB — CULTURE, BLOOD (ROUTINE X 2): Culture: NO GROWTH

## 2019-12-03 LAB — BASIC METABOLIC PANEL
Anion gap: 9 (ref 5–15)
BUN: 10 mg/dL (ref 8–23)
CO2: 22 mmol/L (ref 22–32)
Calcium: 8.4 mg/dL — ABNORMAL LOW (ref 8.9–10.3)
Chloride: 103 mmol/L (ref 98–111)
Creatinine, Ser: 0.69 mg/dL (ref 0.61–1.24)
GFR calc Af Amer: >60 mL/min (ref >60)
GFR calc non Af Amer: >60 mL/min (ref >60)
Glucose, Bld: 87 mg/dL (ref 70–99)
Potassium: 3.1 mmol/L — ABNORMAL LOW (ref 3.5–5.1)
Sodium: 134 mmol/L — ABNORMAL LOW (ref 135–145)

## 2019-12-03 LAB — GLUCOSE, CAPILLARY: Glucose-Capillary: 68 mg/dL — ABNORMAL LOW (ref 70–99)

## 2019-12-03 MED ORDER — MAGNESIUM OXIDE 400 (241.3 MG) MG PO TABS
400.0000 mg | ORAL_TABLET | Freq: Two times a day (BID) | ORAL | Status: DC
  Administered 2019-12-03 – 2019-12-07 (×9): 400 mg via ORAL
  Filled 2019-12-03 (×10): qty 1

## 2019-12-03 MED ORDER — POTASSIUM CHLORIDE CRYS ER 20 MEQ PO TBCR
40.0000 meq | EXTENDED_RELEASE_TABLET | ORAL | Status: AC
  Administered 2019-12-03 (×2): 40 meq via ORAL
  Filled 2019-12-03 (×2): qty 2

## 2019-12-03 MED ORDER — MAGNESIUM SULFATE 4 GM/100ML IV SOLN
4.0000 g | Freq: Once | INTRAVENOUS | Status: AC
  Administered 2019-12-03: 4 g via INTRAVENOUS
  Filled 2019-12-03: qty 100

## 2019-12-03 NOTE — Progress Notes (Signed)
Nutrition Follow-up  DOCUMENTATION CODES:   Not applicable  INTERVENTION:  - continue Ensure Enlive TID. - continue to encourage PO intakes.    NUTRITION DIAGNOSIS:   Inadequate oral intake related to acute illness as evidenced by per patient/family report. -ongoing  GOAL:   Patient will meet greater than or equal to 90% of their needs -minimally met on average   MONITOR:   PO intake, Supplement acceptance, Labs, Weight trends  ASSESSMENT:   69 y.o. male with medical history of anemia, arthralgia, arthritis, gout, HTN, prostate cancer, generalized weakness, malnutrition of moderate degree, alcohol abuse (ongoing), alcohol withdrawal seizures, and other comorbidities. He presented due to generalized weakness and deconditioning associated with pain in his L knee and hand. Symptoms began ~1 week ago. During this time he has been too weak to get up so he has mainly been sitting in a chair and not eating. He typically drinks 1 bottle of wine/day but he stopped drinking d/t weakness. His girlfriend helps him at home.  The most recently documented intakes are 25% of breakfast and 0% of lunch on 4/12; 10% of lunch and 15% of dinner on 4/13. He has been accepting Ensure 100% of the time offered. Weight has fluctuated throughout hospitalization, but weight today is consistent weight admission (4/11) weight.   Per notes: - sepsis 2/2 colitis - acute gouty arthritis of L knee and L knee joint effusion - meniscus tear - generalized weakness, L-sided weakness - vitamin B12 deficiency--supplementation ordered  - alcohol abuse - L hand swelling - likely plan for SNF at time of d/c     Labs reviewed; CBG: 68 mg/dl, Na: 134 mmol/l, K: 3.1 mmol/l, Ca:8.4 mg/dl, Mg: 1.3 mg/dl. Medications reviewed; 1000 mcg subq cyanocobalamin x1 dose/day 4/15-4/20, 325 mg ferrous sulfate/day, 1 mg folvite/day, 400 mg mag-ox BID, 4 g IV Mg sulfate x1 run 4/16, 1 tablet prosight/day, 40 mg oral protonix/day, 40  mEq Klor-Con x2 doses 4/16, 100 mg thiamine/day.  IVF; NS @ 75 ml/hr.    Diet Order:   Diet Order            Diet full liquid Room service appropriate? Yes; Fluid consistency: Thin  Diet effective now              EDUCATION NEEDS:   No education needs have been identified at this time  Skin:  Skin Assessment: Reviewed RN Assessment  Last BM:  4/15  Height:   Ht Readings from Last 1 Encounters:  11/28/19 5' (1.524 m)    Weight:   Wt Readings from Last 1 Encounters:  12/03/19 55.7 kg    Ideal Body Weight:  47.7 kg  BMI:  Body mass index is 23.98 kg/m.  Estimated Nutritional Needs:   Kcal:  1485-1640 kcal  Protein:  75-90 grams  Fluid:  >/= 1.7 L/day     Jarome Matin, MS, RD, LDN, CNSC Inpatient Clinical Dietitian RD pager # available in AMION  After hours/weekend pager # available in [facility]

## 2019-12-03 NOTE — TOC Initial Note (Signed)
Transition of Care [facility]) - Initial/Assessment Note    Patient Details  Name: Shawn Hendrix MRN: [id number] Date of Birth: Aug 16, 1951  Transition of Care [facility]) CM/SW Contact:    Ross Ludwig, LCSW Phone Number: 12/03/2019, 2:30 PM  Clinical Narrative:                  CSW attempted to speak to patient, however he had some confusion, CSW completed assessment via reviewing the chart.  Patient has  history of drinking, and lives alone.  Patient has some family that are nearby.  Patient was able to participate in therapy and expressed to the therapist that he is motivated to get his strength back so he can return back home.  CSW to begin bed search in [facility].  Expected Discharge Plan: Skilled Nursing Facility Barriers to Discharge: SNF Pending bed offer   Patient Goals and CMS Choice Patient states their goals for this hospitalization and ongoing recovery are:: To go to SNF for short term rehab, then return back home with home health. CMS Medicare.gov Compare Post Acute Care list provided to:: Patient Choice offered to / list presented to : Patient  Expected Discharge Plan and Services Expected Discharge Plan: [place] Choice: [place] arrangements for the past 2 months: Single Family Home                   DME Agency: NA       HH Arranged: NA          Prior Living Arrangements/Services Living arrangements for the past 2 months: Single Family Home Lives with:: Self Patient language and need for interpreter reviewed:: Yes Do you feel safe going back to the place where you live?: No   Patient feels he needs some rehab at SNF before returning back home.  Need for Family Participation in Patient Care: No (Comment) Care giver support system in place?: No (comment)   Criminal Activity/Legal Involvement Pertinent to Current Situation/Hospitalization: No - Comment as needed  Activities of Daily  Living Home Assistive Devices/Equipment: Cane (specify quad or straight) ADL Screening (condition at time of admission) Patient's cognitive ability adequate to safely complete daily activities?: Yes Is the patient deaf or have difficulty hearing?: No Does the patient have difficulty seeing, even when wearing glasses/contacts?: Yes Does the patient have difficulty concentrating, remembering, or making decisions?: No Patient able to express need for assistance with ADLs?: Yes Does the patient have difficulty dressing or bathing?: Yes Independently performs ADLs?: No Communication: Independent Dressing (OT): Dependent Is this a change from baseline?: Pre-admission baseline Grooming: Dependent Is this a change from baseline?: Pre-admission baseline Feeding: Independent Bathing: Dependent Is this a change from baseline?: Pre-admission baseline Toileting: Dependent Is this a change from baseline?: Pre-admission baseline In/Out Bed: Dependent Is this a change from baseline?: Pre-admission baseline Walks in Home: Dependent Is this a change from baseline?: Pre-admission baseline Does the patient have difficulty walking or climbing stairs?: Yes Weakness of Legs: Both Weakness of Arms/Hands: Both  Permission Sought/Granted Permission sought to share information with : Family Supports Permission granted to share information with : Yes, Verbal Permission Granted  Share Information with NAME: Doggett,Martin Relative   [phone] and Philomena Doheny Niece   [phone] and [place] Bing   [phone]  Permission granted to share info w AGENCY: SNF admissions        Emotional Assessment Appearance:: Appears stated age   Affect (typically observed):  Accepting, Calm, Stable Orientation: : Oriented to Self, Oriented to Place Alcohol / Substance Use: Alcohol Use, Tobacco Use Psych Involvement: No (comment)  Admission diagnosis:  Acute left-sided weakness [R53.1] Generalized weakness  [R53.1] Patient Active Problem List   Diagnosis Date Noted  . Acute left-sided weakness   . Acute gouty arthropathy   . Sepsis ([place])   . Colitis   . Folate deficiency   . Alcohol abuse   . Hypomagnesemia   . Essential hypertension   . Generalized weakness 11/28/2019  . Malnutrition of moderate degree 12/05/2016  . Seizure ([place]) 12/02/2016  . Alcohol withdrawal seizure, uncomplicated ([place]) [id number]  . Anemia 12/02/2016  . Thrombocytopenia ([place]) 12/02/2016  . Hypokalemia 12/02/2016  . Malignant neoplasm of prostate ([place]) 10/31/2014   PCP:  Javier Docker, MD Pharmacy:   [id number] - [place], [place] [address] Phone: [phone] Fax: [phone]     Social Determinants of Health (SDOH) Interventions    Readmission Risk Interventions No flowsheet data found.

## 2019-12-03 NOTE — NC FL2 (Signed)
[place] LEVEL OF CARE SCREENING TOOL     IDENTIFICATION  Patient Name: Shawn Hendrix Birthdate: 1951-04-13 Sex: male Admission Date (Current Location): 11/28/2019  [place] and [place] Number:  Kathleen Argue [id number] [place] and Address:  [facility],  [place], [place]      Provider Number: [id number]  Attending Physician Name and Address:  Eugenie Filler, MD  Relative Name and Phone Number:  [facility] Relative   [phone] or    Current Level of Care: Hospital Recommended Level of Care: [place] Prior Approval Number:    Date Approved/Denied:   PASRR Number:    Discharge Plan: SNF    Current Diagnoses: Patient Active Problem List   Diagnosis Date Noted  . Acute left-sided weakness   . Acute gouty arthropathy   . Sepsis ([place])   . Colitis   . Folate deficiency   . Alcohol abuse   . Hypomagnesemia   . Essential hypertension   . Generalized weakness 11/28/2019  . Malnutrition of moderate degree 12/05/2016  . Seizure ([place]) 12/02/2016  . Alcohol withdrawal seizure, uncomplicated ([place]) [id number]  . Anemia 12/02/2016  . Thrombocytopenia ([place]) 12/02/2016  . Hypokalemia 12/02/2016  . Malignant neoplasm of prostate ([place]) 10/31/2014    Orientation RESPIRATION BLADDER Height & Weight     Self, Place  Normal Incontinent Weight: 122 lb 12.8 oz (55.7 kg) Height:  5' (152.4 cm)  BEHAVIORAL SYMPTOMS/MOOD NEUROLOGICAL BOWEL NUTRITION STATUS      Continent Diet(Mechanical Soft)  AMBULATORY STATUS COMMUNICATION OF NEEDS Skin   Limited Assist Verbally Normal                       Personal Care Assistance Level of Assistance  Bathing, Dressing, Feeding Bathing Assistance: Limited assistance Feeding assistance: Independent Dressing Assistance: Limited assistance     Functional Limitations Info  Sight, Speech, Hearing Sight Info: Adequate Hearing Info: Adequate Speech Info: Adequate     SPECIAL CARE FACTORS FREQUENCY  PT (By licensed PT), OT (By licensed OT)     PT Frequency: Minimum 5x a week OT Frequency: Minimum 5x a week            Contractures Contractures Info: Not present    Additional Factors Info  Allergies, Code Status, Isolation Precautions Code Status Info: Full Code Allergies Info: NKA     Isolation Precautions Info: Contact precautions     Current Medications (12/03/2019):  This is the current hospital active medication list Current Facility-Administered Medications  Medication Dose Route Frequency Provider Last Rate Last Admin  . 0.9 %  sodium chloride infusion   Intravenous Continuous Raiford Noble [place], [place] 75 mL/hr at 12/03/19 1412 New Bag at 12/03/19 1412  . acetaminophen (TYLENOL) tablet 650 mg  650 mg Oral Q6H PRN Raiford Noble Latif, DO   650 mg at 11/30/19 1059  . allopurinol (ZYLOPRIM) tablet 100 mg  100 mg Oral Daily Regalado, Belkys A, MD   100 mg at 12/03/19 0948  . bisacodyl (DULCOLAX) suppository 10 mg  10 mg Rectal Daily PRN Raiford Noble Latif, DO      . cefTRIAXone (ROCEPHIN) 2 g in sodium chloride 0.9 % 100 mL IVPB  2 g Intravenous Daily Eugenie Filler, MD 200 mL/hr at 12/03/19 0828 2 g at 12/03/19 0828  . colchicine tablet 0.6 mg  0.6 mg Oral BID Raiford Noble [place], DO   0.6 mg at 12/03/19 [id number]  . cyanocobalamin ((VITAMIN B-12)) injection 1,000  mcg  1,000 mcg Subcutaneous Daily Eugenie Filler, MD   1,000 mcg at 12/03/19 0949  . diltiazem (CARDIZEM CD) 24 hr capsule 240 mg  240 mg Oral Daily Raiford Noble [place], DO   240 mg at 12/03/19 [id number]  . feeding supplement (ENSURE ENLIVE) (ENSURE ENLIVE) liquid   Oral TID BM Raiford Noble [place], DO   Given at 12/03/19 1413  . ferrous sulfate tablet 325 mg  325 mg Oral Q breakfast Raiford Noble [place], [place]   325 mg at 12/03/19 [id number]  . folic acid (FOLVITE) tablet 1 mg  1 mg Oral Daily Raiford Noble [place], DO   1 mg at 12/03/19 0948  . heparin injection 5,000 Units  5,000 Units  Subcutaneous [address] [place], [place]   5,000 Units at 12/03/19 1412  . magnesium oxide (MAG-OX) tablet 400 mg  400 mg Oral BID Eugenie Filler, MD   400 mg at 12/03/19 0948  . metroNIDAZOLE (FLAGYL) IVPB 500 mg  500 mg Intravenous Q8H Wofford, Drew A, RPH 100 mL/hr at 12/03/19 1414 500 mg at 12/03/19 1414  . multivitamin (PROSIGHT) tablet 1 tablet  1 tablet Oral Daily Raiford Noble [place], [place]   1 tablet at 12/03/19 [id number]  . ondansetron (ZOFRAN) tablet 4 mg  4 mg Oral Q6H PRN Raiford Noble Latif, DO       Or  . ondansetron [facility]) injection 4 mg  4 mg Intravenous Q6H PRN Sheikh, Omair Latif, DO      . pantoprazole (PROTONIX) EC tablet 40 mg  40 mg Oral Daily Raiford Noble [place], DO   40 mg at 12/03/19 0948  . polyethylene glycol (MIRALAX / GLYCOLAX) packet 17 g  17 g Oral Daily PRN Raiford Noble Latif, DO      . thiamine tablet 100 mg  100 mg Oral Daily Raiford Noble [place], DO   100 mg at 12/03/19 [id number]   Or  . thiamine (B-1) injection 100 mg  100 mg Intravenous Daily Sheikh, Georgina Quint Latif, DO      . traMADol Veatrice [place]) tablet 50 mg  50 mg Oral Q6H PRN Kerney Elbe, DO         Discharge Medications: Please see discharge summary for a list of discharge medications.  Relevant Imaging Results:  Relevant Lab Results:   Additional Information SSN [id number]  Ross Ludwig, LCSW

## 2019-12-03 NOTE — Progress Notes (Signed)
PROGRESS NOTE    Shawn Hendrix  [id number] DOB: 12-Dec-1950 DOA: 11/28/2019 PCP: Javier Docker, MD    Chief Complaint  Patient presents with  . Foot Swelling  . Arm Swelling    hands    Brief Narrative:  69 year old with past medical history significant for gout, hypertension, history of prostate cancer alcohol withdrawal and seizure who presents complaining of generalized weakness and deconditioning and left knee pain and hand pain.  Patient in the ED CT head without contrast no acute finding.  MRI negative for acute stroke. Patient did spike fever overnight temperature 101.  MRI of the left knee showed large effusion.  He underwent arthrocentesis on 4/12.  Cell count and synovial fluid 21,000, extracellular monosodium urate crystals present. Also developed episode of vomiting, CT abdomen and pelvis showed mild wall thickening and submucosal edema involving the ascending colon cold suggest an inflammatory or infectious colitis.   Assessment & Plan:   Active Problems:   Generalized weakness   Acute left-sided weakness   Acute gouty arthropathy   Sepsis ([place])   Colitis   Folate deficiency   Alcohol abuse   Hypomagnesemia   Essential hypertension  1 sepsis likely secondary to colitis Patient had presented with leukocytosis, fever with a temp of 101, tachypnea, tachycardia meeting criteria for sepsis.  CT abdomen and pelvis done concerning for mild wall thickness submucosal edema involving the ascending colon suggesting inflammatory versus infectious colitis.  Urinalysis with nitrite negative, leukocytes negative.  Urine cultures with insignificant growth.  Blood cultures with no growth to date.  Cultures from arthrocentesis pending with no growth to date.  Patient was on vancomycin that was discontinued.  Narrowed from IV cefepime to IV Rocephin.  Continue IV Flagyl.  If continued improvement could likely transition to oral antibiotics tomorrow.  Advance diet to soft  diet.  Follow.  2.  Acute gouty arthritis of the left knee/left knee joint effusion Chronic alcohol use likely playing a role.  Alcohol cessation stressed to patient.  Patient seen in consultation by orthopedics.  Patient underwent arthrocentesis 11/29/2019 with synovial fluid showing 21,000 white blood cells, monosodium urate crystals.  Cultures pending with no growth to date.  Patient improving clinically on colchicine 0.6 mg twice daily which we will continue for now.  Continue allopurinol.  Was seen by orthopedics.  Supportive care.   3.  Meniscus tear per MRI Per orthopedics.  4.  Generalized weakness/left-sided weakness/failure to thrive/vitamin B12 deficiency/folate deficiency MRI negative for any acute abnormalities.  Left hand and left knee pain improving.  Continue folic acid 1 mg daily.  Continue vitamin B12 subcutaneously daily x6 more days, and then weekly, and then monthly.  Could also potentially transition to oral vitamin B12 supplementation on discharge.  PT/OT.  Needs SNF.   5.  Alcohol abuse Alcohol cessation stressed to patient.  Continue the Ativan withdrawal protocol, folic acid, multivitamin, thiamine.  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  Erosive changes to the ulnar styloid suggesting an underlying inflammatory arthropathy such as rheumatoid arthritis.  Patient currently being treated for an acute gouty arthritis.  Outpatient follow-up.  7.  Hypomagnesemia Magnesium at 1.3 this morning.  Magnesium sulfate 4 g IV x1.  Start oral magnesium supplementation.  Repleted.  8.  Hypertension Blood pressure stable.  Continue Cardizem.  9.  Hyperbilirubinemia Trending down. Follow.  10.  Hypokalemia K. Dur 40 mEq p.o. every  4 hours x2 doses.   DVT prophylaxis: Heparin Code Status: Full Family Communication: Updated patient.  No family at bedside. Disposition:   Status is:  Inpatient    Dispo: The patient is from: Home              Anticipated d/c is to: SNF              Anticipated d/c date is: Likely to skilled nursing facility in the next 2-3 days.              Patient currently improving slowly and clinically.  Likely to skilled nursing facility once course of antibiotics have been completed and on oral antibiotics.        Consultants:   Orthopedics: Dr. Lorin Mercy 11/29/2019    Procedures:   CT abdomen and pelvis 11/29/2019  CT head 11/28/2019  MRI brain 11/28/2019  MRI left knee 11/29/2019  Left knee aspiration/arthrocentesis 11/29/2019  Antimicrobials:   .  IV cefepime 11/29/2019>>>>> 12/02/2019  IV Flagyl 11/29/2019>>>>>>>  IV vancomycin 11/29/2019>>> 11/30/2019  IV Rocephin 12/02/2019   Subjective: Patient sitting up in bed.  Tolerated full liquid diet.  Complaining of some left ankle pain.  Left knee pain improved.  Denies diarrhea.  No abdominal pain.  Sitting up in bed.   Objective: Vitals:   12/02/19 1453 12/02/19 2157 12/03/19 0100 12/03/19 0600  BP: 131/80 134/85  140/84  Pulse: 82 (!) 105 82 84  Resp: 15   (!) 21  Temp: 98.2 F (36.8 C) 98.2 F (36.8 C)  98.4 F (36.9 C)  TempSrc: Oral Oral  Oral  SpO2: 99% 100%  97%  Weight:    55.7 kg  Height:        Intake/Output Summary (Last 24 hours) at 12/03/2019 1237 Last data filed at 12/03/2019 0603 Gross per 24 hour  Intake 909.59 ml  Output 2270 ml  Net -1360.41 ml   Filed Weights   12/01/19 0512 12/02/19 0534 12/03/19 0600  Weight: 54.4 kg 57.2 kg 55.7 kg    Examination:  General exam: NAD. Respiratory system: Lungs clear to auscultation bilaterally anterior lung fields.  No wheezing, no crackles, no rhonchi.  Normal respiratory effort.  Cardiovascular system: RRR no murmurs rubs or gallops.  No JVD.  No lower extremity edema.  Gastrointestinal system: Abdomen is nontender, nondistended, soft, positive bowel sounds.  No rebound.  No guarding.  Central nervous  system: Alert and oriented. No focal neurological deficits. Extremities: Left knee less tender to palpation.  Left hand less tender to palpation.  Skin: No rashes, lesions or ulcers Psychiatry: Judgement and insight appear normal. Mood & affect appropriate.     Data Reviewed: I have personally reviewed following labs and imaging studies  CBC: Recent Labs  Lab 11/28/19 1152 11/29/19 0520 12/01/19 0618 12/02/19 0537 12/03/19 0502  WBC 13.2* 10.3 12.1* 13.4* 11.7*  NEUTROABS 10.7* 7.9* 9.9* 10.8*  --   HGB 10.4* 8.7* 8.0* 8.5* 7.9*  HCT 30.2* 25.7* 23.7* 26.6* 24.0*  MCV 111.4* 112.2* 115.6* 114.7* 111.6*  PLT 255 273 430* 522* 522*    Basic Metabolic Panel: Recent Labs  Lab 11/29/19 0520 11/30/19 0537 12/01/19 0618 12/02/19 0537 12/03/19 0502  NA 131* 132* 133* 135 134*  K 3.9 3.9 3.3* 3.8 3.1*  CL 93* 101 102 106 103  CO2 27 23 21* 21* 22  GLUCOSE 112* 80 118* 88 87  BUN 23 19 18 14 10   CREATININE 1.02 0.91 0.85 0.77 0.69  CALCIUM  8.5* 7.9* 8.2* 8.5* 8.4*  MG 1.6* 1.8 2.0  --  1.3*  PHOS 2.8  --   --   --   --     GFR: Estimated Creatinine Clearance: 61.6 mL/min (by C-G formula based on SCr of 0.69 mg/dL).  Liver Function Tests: Recent Labs  Lab 11/28/19 1152 11/29/19 0520 12/02/19 0537  AST 25 20 40  ALT 13 11 17   ALKPHOS 57 42 53  BILITOT 1.5* 1.3* 1.0  PROT 7.5 6.5 6.2*  ALBUMIN 2.9* 2.6* 2.3*    CBG: Recent Labs  Lab 11/30/19 0804 11/30/19 1308 12/01/19 0743 12/02/19 0741 12/03/19 0801  GLUCAP 92 113* 108* 74 68*     Recent Results (from the past 240 hour(s))  Urine culture     Status: Abnormal   Collection Time: 11/28/19 11:29 AM   Specimen: Urine, Random  Result Value Ref Range Status   Specimen Description   Final    URINE, RANDOM Performed at [facility], [place] [address], [place], [place] [address]    Special Requests   Final    NONE Performed at [facility], [place] [address],  [place], [place] [address]    Culture (A)  Final    <10,000 COLONIES/mL INSIGNIFICANT GROWTH Performed at [facility] [address] [place], [place] [address]    Report Status 11/29/2019 FINAL  Final  SARS CORONAVIRUS 2 (TAT 6-24 HRS) Nasopharyngeal Nasopharyngeal Swab     Status: None   Collection Time: 11/28/19 11:55 AM   Specimen: Nasopharyngeal Swab  Result Value Ref Range Status   SARS Coronavirus 2 NEGATIVE NEGATIVE Final    Comment: (NOTE) SARS-CoV-2 target nucleic acids are NOT DETECTED. The SARS-CoV-2 RNA is generally detectable in upper and lower respiratory specimens during the acute phase of infection. Negative results do not preclude SARS-CoV-2 infection, do not rule out co-infections with other pathogens, and should not be used as the sole basis for treatment or other patient management decisions. Negative results must be combined with clinical observations, patient history, and epidemiological information. The expected result is Negative. Fact Sheet for Patients: SugarRoll.be Fact Sheet for Healthcare Providers: https://www.woods-mathews.com/ This test is not yet approved or cleared by the [place] FDA and  has been authorized for detection and/or diagnosis of SARS-CoV-2 by FDA under an Emergency Use Authorization (EUA). This EUA will remain  in effect (meaning this test can be used) for the duration of the COVID-19 declaration under Section 56 4(b)(1) of the Act, 21 U.S.C. section 360bbb-3(b)(1), unless the authorization is terminated or revoked sooner. Performed at [facility], [place] [address], [place], [place] [address]   Culture, blood (routine x 2)     Status: None (Preliminary result)   Collection Time: 11/28/19  3:26 PM   Specimen: BLOOD  Result Value Ref Range Status   Specimen Description   Final    BLOOD RIGHT HAND Performed at [facility] [address] [place], [place]  [address]    Special Requests   Final    BOTTLES DRAWN AEROBIC ONLY Blood Culture results may not be optimal due to an inadequate volume of blood received in culture bottles Performed at [facility] [address] [place], [place] [address]    Culture   Final    NO GROWTH 4 DAYS Performed at [facility], [place] [address] [place], [place] [address]    Report Status PENDING  Incomplete  Culture,  blood (routine x 2)     Status: None (Preliminary result)   Collection Time: 11/29/19  5:20 AM   Specimen: BLOOD  Result Value Ref Range Status   Specimen Description   Final    BLOOD LEFT ARM Performed at [facility] [address] [place], [place] [address]    Special Requests   Final    BOTTLES DRAWN AEROBIC AND ANAEROBIC Blood Culture adequate volume Performed at [facility] [address], [place], [place] [address]    Culture   Final    NO GROWTH 3 DAYS Performed at [facility], [place] [address], [place], [place] [address]    Report Status PENDING  Incomplete  Body fluid culture     Status: None   Collection Time: 11/29/19 12:02 PM   Specimen: Synovium  Result Value Ref Range Status   Specimen Description   Final    SYNOVIAL Performed at [facility] [address], [place], [place] [address]    Special Requests LOOK FOR GC  Final   Gram Stain   Final    MODERATE WBC PRESENT, PREDOMINANTLY PMN NO ORGANISMS SEEN    Culture   Final    NO GROWTH 3 DAYS Performed at [facility], [place] [address], [place], [place] [address]    Report Status 12/02/2019 FINAL  Final  Anaerobic culture     Status: None (Preliminary result)   Collection Time: 11/29/19 12:02 PM   Specimen: Synovium  Result Value Ref Range Status   Specimen Description   Final    SYNOVIAL Performed at [facility] [address], [place], [place] [address]    Special Requests   Final     NONE Performed at [facility], [place] [address] [place], [place] [address]    Culture   Final    NO ANAEROBES ISOLATED; CULTURE IN PROGRESS FOR 5 DAYS   Report Status PENDING  Incomplete         Radiology Studies: No results found.      Scheduled Meds: . allopurinol  100 mg Oral Daily  . colchicine  0.6 mg Oral BID  . cyanocobalamin  1,000 mcg Subcutaneous Daily  . diltiazem  240 mg Oral Daily  . feeding supplement (ENSURE ENLIVE)   Oral TID BM  . ferrous sulfate  325 mg Oral Q breakfast  . folic acid  1 mg Oral Daily  . heparin  5,000 Units Subcutaneous Q8H  . magnesium oxide  400 mg Oral BID  . multivitamin  1 tablet Oral Daily  . pantoprazole  40 mg Oral Daily  . potassium chloride  40 mEq Oral Q4H  . thiamine  100 mg Oral Daily   Or  . thiamine  100 mg Intravenous Daily   Continuous Infusions: . sodium chloride 75 mL/hr at 12/02/19 2302  . cefTRIAXone (ROCEPHIN)  IV 2 g (12/03/19 0828)  . metronidazole 500 mg (12/03/19 0556)     LOS: 4 days    Time spent: 35 minutes    Irine Seal, MD [facility]   To contact the attending provider between 7A-7P or the covering provider during after hours 7P-7A, please log into the web site [email or web address] and access using universal [place] password for that web site. If you do not have the password, please call the hospital operator.  12/03/2019, 12:37 PM

## 2019-12-04 DIAGNOSIS — K529 Noninfective gastroenteritis and colitis, unspecified: Secondary | ICD-10-CM | POA: Diagnosis not present

## 2019-12-04 DIAGNOSIS — M109 Gout, unspecified: Secondary | ICD-10-CM | POA: Diagnosis not present

## 2019-12-04 DIAGNOSIS — R531 Weakness: Secondary | ICD-10-CM | POA: Diagnosis not present

## 2019-12-04 DIAGNOSIS — A419 Sepsis, unspecified organism: Secondary | ICD-10-CM | POA: Diagnosis not present

## 2019-12-04 LAB — CBC WITH DIFFERENTIAL/PLATELET
Abs Immature Granulocytes: 0.41 10*3/uL — ABNORMAL HIGH (ref 0.00–0.07)
Basophils Absolute: 0.1 10*3/uL (ref 0.0–0.1)
Basophils Relative: 1 %
Eosinophils Absolute: 0.3 10*3/uL (ref 0.0–0.5)
Eosinophils Relative: 3 %
HCT: 23.7 % — ABNORMAL LOW (ref 39.0–52.0)
Hemoglobin: 8.6 g/dL — ABNORMAL LOW (ref 13.0–17.0)
Immature Granulocytes: 4 %
Lymphocytes Relative: 9 %
Lymphs Abs: 0.9 10*3/uL (ref 0.7–4.0)
MCH: 41.1 pg — ABNORMAL HIGH (ref 26.0–34.0)
MCHC: 36.3 g/dL — ABNORMAL HIGH (ref 30.0–36.0)
MCV: 113.4 fL — ABNORMAL HIGH (ref 80.0–100.0)
Monocytes Absolute: 1.2 10*3/uL — ABNORMAL HIGH (ref 0.1–1.0)
Monocytes Relative: 13 %
Neutro Abs: 6.7 10*3/uL (ref 1.7–7.7)
Neutrophils Relative %: 70 %
Platelets: 621 10*3/uL — ABNORMAL HIGH (ref 150–400)
RBC: 2.09 MIL/uL — ABNORMAL LOW (ref 4.22–5.81)
RDW: 15 % (ref 11.5–15.5)
WBC: 9.5 10*3/uL (ref 4.0–10.5)
nRBC: 0 % (ref 0.0–0.2)

## 2019-12-04 LAB — GLUCOSE, CAPILLARY: Glucose-Capillary: 79 mg/dL (ref 70–99)

## 2019-12-04 LAB — BASIC METABOLIC PANEL
Anion gap: 9 (ref 5–15)
BUN: 7 mg/dL — ABNORMAL LOW (ref 8–23)
CO2: 24 mmol/L (ref 22–32)
Calcium: 8.4 mg/dL — ABNORMAL LOW (ref 8.9–10.3)
Chloride: 101 mmol/L (ref 98–111)
Creatinine, Ser: 0.66 mg/dL (ref 0.61–1.24)
GFR calc Af Amer: >60 mL/min (ref >60)
GFR calc non Af Amer: >60 mL/min (ref >60)
Glucose, Bld: 82 mg/dL (ref 70–99)
Potassium: 3.5 mmol/L (ref 3.5–5.1)
Sodium: 134 mmol/L — ABNORMAL LOW (ref 135–145)

## 2019-12-04 LAB — ANAEROBIC CULTURE

## 2019-12-04 LAB — CULTURE, BLOOD (ROUTINE X 2)
Culture: NO GROWTH
Special Requests: ADEQUATE

## 2019-12-04 LAB — VITAMIN B1: Vitamin B1 (Thiamine): 87.1 nmol/L (ref 66.5–200.0)

## 2019-12-04 LAB — MAGNESIUM: Magnesium: 1.6 mg/dL — ABNORMAL LOW (ref 1.7–2.4)

## 2019-12-04 MED ORDER — METRONIDAZOLE 500 MG PO TABS
500.0000 mg | ORAL_TABLET | Freq: Three times a day (TID) | ORAL | Status: DC
  Administered 2019-12-04 – 2019-12-07 (×11): 500 mg via ORAL
  Filled 2019-12-04 (×12): qty 1

## 2019-12-04 MED ORDER — MAGNESIUM SULFATE 4 GM/100ML IV SOLN
4.0000 g | Freq: Once | INTRAVENOUS | Status: AC
  Administered 2019-12-04: 4 g via INTRAVENOUS
  Filled 2019-12-04: qty 100

## 2019-12-04 MED ORDER — METRONIDAZOLE 500 MG PO TABS: 500.0000 mg | ORAL_TABLET | Freq: Three times a day (TID) | ORAL | Status: DC

## 2019-12-04 MED ORDER — POTASSIUM CHLORIDE CRYS ER 20 MEQ PO TBCR
40.0000 meq | EXTENDED_RELEASE_TABLET | Freq: Once | ORAL | Status: AC
  Administered 2019-12-04: 40 meq via ORAL
  Filled 2019-12-04: qty 2

## 2019-12-04 NOTE — Progress Notes (Signed)
Physical Therapy Treatment Patient Details Name: Shawn Hendrix MRN: [id number] DOB: 04-12-1951 Today's Date: 12/04/2019    History of Present Illness 69 yo male admitted with weakness, falls. L knee pain, FTT. Hx of gout, OA, seizures, ETOH use, prostate ca. MRI (+) L knee complex meniscus tear    PT Comments    Pt very cooperative and with marked improvement in strength, activity tolerance and balance.  Pt ambulated 100+ ft in hall with RW and min assist for balance/support.  Pt pleased with progress and up in chair - alarm in place.  Follow Up Recommendations  SNF     Equipment Recommendations       Recommendations for Other Services       Precautions / Restrictions Precautions Precautions: Fall Precaution Comments: high fall risk Restrictions Weight Bearing Restrictions: No    Mobility  Bed Mobility Overal bed mobility: Needs Assistance Bed Mobility: Supine to Sit     Supine to sit: Min assist     General bed mobility comments: Increased time but min physical assist  Transfers Overall transfer level: Needs assistance Equipment used: Rolling walker (2 wheeled) Transfers: Sit to/from Stand Sit to Stand: Min assist;+2 safety/equipment         General transfer comment: cues for safe transition position and use of UEs to self assist.  Physical assist to bring wt up and fwd and to balance in standing  Ambulation/Gait Ambulation/Gait assistance: Min assist;+2 safety/equipment(chair follow) Gait Distance (Feet): 110 Feet Assistive device: Rolling walker (2 wheeled) Gait Pattern/deviations: Step-through pattern;Decreased step length - right;Decreased step length - left;Shuffle;Trunk flexed;Narrow base of support     General Gait Details: cues for posture, pace, position from RW and to increase BOS   Stairs             Wheelchair Mobility    Modified Rankin (Stroke Patients Only)       Balance Overall balance assessment: Needs  assistance;History of Falls Sitting-balance support: Feet supported Sitting balance-Leahy Scale: Good     Standing balance support: During functional activity;Bilateral upper extremity supported Standing balance-Leahy Scale: Poor                              Cognition Arousal/Alertness: Awake/alert Behavior During Therapy: WFL for tasks assessed/performed Overall Cognitive Status: No family/caregiver present to determine baseline cognitive functioning                                 General Comments: mumbles speech, follows directions appropriately      Exercises      General Comments        Pertinent Vitals/Pain Pain Assessment: No/denies pain    Home Living                      Prior Function            PT Goals (current goals can now be found in the care plan section) Acute Rehab PT Goals Patient Stated Goal: to get strength back PT Goal Formulation: With patient Time For Goal Achievement: 12/13/19 Potential to Achieve Goals: Fair Progress towards PT goals: Progressing toward goals    Frequency    Min 2X/week      PT Plan Current plan remains appropriate    Co-evaluation              AM-PAC PT "6  Clicks" Mobility   Outcome Measure  Help needed turning from your back to your side while in a flat bed without using bedrails?: A Little Help needed moving from lying on your back to sitting on the side of a flat bed without using bedrails?: A Little Help needed moving to and from a bed to a chair (including a wheelchair)?: A Little Help needed standing up from a chair using your arms (e.g., wheelchair or bedside chair)?: A Little Help needed to walk in hospital room?: A Little Help needed climbing 3-5 steps with a railing? : A Lot 6 Click Score: 17    End of Session Equipment Utilized During Treatment: Gait belt Activity Tolerance: Patient tolerated treatment well Patient left: in chair;with call bell/phone  within reach;with chair alarm set;with nursing/sitter in room Nurse Communication: Mobility status PT Visit Diagnosis: Muscle weakness (generalized) (M62.81);History of falling (Z91.81);Adult, failure to thrive (R62.7)     Time: [id number] PT Time Calculation (min) (ACUTE ONLY): 19 min  Charges:  $Gait Training: 8-22 mins                     [place] Pager [phone] Office [phone]    Lilah Mijangos 12/04/2019, 2:58 PM

## 2019-12-04 NOTE — Progress Notes (Signed)
PROGRESS NOTE    Shawn Hendrix  [id number] DOB: 07-07-1951 DOA: 11/28/2019 PCP: Javier Docker, MD    Chief Complaint  Patient presents with  . Foot Swelling  . Arm Swelling    hands    Brief Narrative:  69 year old with past medical history significant for gout, hypertension, history of prostate cancer alcohol withdrawal and seizure who presents complaining of generalized weakness and deconditioning and left knee pain and hand pain.  Patient in the ED CT head without contrast no acute finding.  MRI negative for acute stroke. Patient did spike fever overnight temperature 101.  MRI of the left knee showed large effusion.  He underwent arthrocentesis on 4/12.  Cell count and synovial fluid 21,000, extracellular monosodium urate crystals present. Also developed episode of vomiting, CT abdomen and pelvis showed mild wall thickening and submucosal edema involving the ascending colon cold suggest an inflammatory or infectious colitis.   Assessment & Plan:   Active Problems:   Generalized weakness   Acute left-sided weakness   Acute gouty arthropathy   Sepsis ([place])   Colitis   Folate deficiency   Alcohol abuse   Hypomagnesemia   Essential hypertension  1 sepsis likely secondary to colitis Patient had presented with leukocytosis, fever with a temp of 101, tachypnea, tachycardia meeting criteria for sepsis.  CT abdomen and pelvis done concerning for mild wall thickness submucosal edema involving the ascending colon suggesting inflammatory versus infectious colitis.  Urinalysis with nitrite negative, leukocytes negative.  Urine cultures with insignificant growth.  Blood cultures with no growth to date.  Cultures from arthrocentesis pending with no growth to date.  Patient was on vancomycin that was discontinued.  Narrowed from IV cefepime to IV Rocephin.  Transition IV Flagyl to oral Flagyl.  Diet has been advanced to a soft diet which patient is tolerating.  Follow.   2.   Acute gouty arthritis of the left knee/left knee joint effusion Chronic alcohol use likely playing a role.  Alcohol cessation stressed to patient.  Patient seen in consultation by orthopedics.  Patient underwent arthrocentesis 11/29/2019 with synovial fluid showing 21,000 white blood cells, monosodium urate crystals.  Cultures pending with no growth to date.  Patient improving clinically on colchicine 0.6 mg twice daily which we will continue to complete a 7-day course and subsequently decrease to 0.6 mg daily. Continue allopurinol.  Was seen by orthopedics.  Supportive care.   3.  Meniscus tear per MRI Per orthopedics.  4.  Generalized weakness/left-sided weakness/failure to thrive/vitamin B12 deficiency/folate deficiency MRI negative for any acute abnormalities.  Left hand and left knee pain improving daily.  Continue folic acid 1 mg daily.  Continue vitamin B12 subcutaneously daily x 5 more days, and then weekly, and then monthly.  Could also potentially transition to oral vitamin B12 supplementation on discharge.  PT/OT.  Needs SNF.   5.  Alcohol abuse Alcohol cessation stressed to patient.  Continue the Ativan withdrawal protocol, folic acid, multivitamin, thiamine.  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  Erosive changes to the ulnar styloid suggesting an underlying inflammatory arthropathy such as rheumatoid arthritis.  Patient currently being treated for an acute gouty arthritis.  Outpatient follow-up.  7.  Hypomagnesemia Magnesium at 1.6 this morning.  Magnesium sulfate 4 g IV x1.  Continue oral magnesium supplementation.  Repeat labs in the morning.  8.  Hypertension Cardizem.   9.  Hyperbilirubinemia Trending down.  Follow.  10.  Hypokalemia KDUR 40 mEq p.o. x1.  Follow.   DVT prophylaxis: Heparin Code Status: Full Family Communication: Updated patient.  No family at bedside. Disposition:     Status is: Inpatient    Dispo: The patient is from: Home              Anticipated d/c is to: SNF              Anticipated d/c date is: Likely to skilled nursing facility on 12/06/2019 when bed available.                Patient currently improving slowly and clinically.  Likely to skilled nursing facility once course of antibiotics have been completed and on oral antibiotics.        Consultants:   Orthopedics: Dr. Lorin Mercy 11/29/2019    Procedures:   CT abdomen and pelvis 11/29/2019  CT head 11/28/2019  MRI brain 11/28/2019  MRI left knee 11/29/2019  Left knee aspiration/arthrocentesis 11/29/2019  Antimicrobials:   .  IV cefepime 11/29/2019>>>>> 12/02/2019  IV Flagyl 11/29/2019>>>>>>> 12/04/2019  IV vancomycin 11/29/2019>>> 11/30/2019  IV Rocephin 12/02/2019  Oral Flagyl 12/04/2019   Subjective: Patient laying in bed.  Patient states if he could get a cane he would be able to ambulate in the hallway like he does at home.  States left knee pain improving.  Left ankle pain improving.  No diarrhea.  Tolerating current diet.  Denies any abdominal pain.  Follow.   Objective: Vitals:   12/03/19 1417 12/03/19 2155 12/04/19 0500 12/04/19 0507  BP: (!) 145/95 136/83  (!) 143/86  Pulse: 86 76  80  Resp:  20  (!) 21  Temp: 98.2 F (36.8 C) 98.6 F (37 C)  98.4 F (36.9 C)  TempSrc: Oral Oral  Oral  SpO2: 100% 99%  100%  Weight:   55.3 kg   Height:        Intake/Output Summary (Last 24 hours) at 12/04/2019 1239 Last data filed at 12/04/2019 0942 Gross per 24 hour  Intake 2917.23 ml  Output 1550 ml  Net 1367.23 ml   Filed Weights   12/02/19 0534 12/03/19 0600 12/04/19 0500  Weight: 57.2 kg 55.7 kg 55.3 kg    Examination:  General exam: NAD. Respiratory system: CTA B anterior lung fields.  No wheezes, no crackles, no rhonchi.  Normal respiratory effort.  Cardiovascular system: Regular rate rhythm no murmurs rubs or gallops.  No JVD.  No lower extremity edema.   Gastrointestinal system: Abdomen is soft, nontender, nondistended, positive bowel sounds.  No rebound.  No guarding.   Central nervous system: Alert and oriented. No focal neurological deficits. Extremities: Left knee nontender to palpation.  Left hand decreased tenderness to palpation. Skin: No rashes, lesions or ulcers Psychiatry: Judgement and insight appear normal. Mood & affect appropriate.     Data Reviewed: I have personally reviewed following labs and imaging studies  CBC: Recent Labs  Lab 11/28/19 1152 11/28/19 1152 11/29/19 0520 12/01/19 0618 12/02/19 0537 12/03/19 0502 12/04/19 0610  WBC 13.2*   < > 10.3 12.1* 13.4* 11.7* 9.5  NEUTROABS 10.7*  --  7.9* 9.9* 10.8*  --  6.7  HGB 10.4*   < > 8.7* 8.0* 8.5* 7.9* 8.6*  HCT 30.2*   < > 25.7* 23.7* 26.6* 24.0* 23.7*  MCV 111.4*   < > 112.2* 115.6* 114.7* 111.6* 113.4*  PLT 255   < > 273 430* 522* 522* 621*   < > =  values in this interval not displayed.    Basic Metabolic Panel: Recent Labs  Lab 11/29/19 0520 11/29/19 0520 11/30/19 0537 12/01/19 0618 12/02/19 0537 12/03/19 0502 12/04/19 0610  NA 131*   < > 132* 133* 135 134* 134*  K 3.9   < > 3.9 3.3* 3.8 3.1* 3.5  CL 93*   < > 101 102 106 103 101  CO2 27   < > 23 21* 21* 22 24  GLUCOSE 112*   < > 80 118* 88 87 82  BUN 23   < > 19 18 14 10  7*  CREATININE 1.02   < > 0.91 0.85 0.77 0.69 0.66  CALCIUM 8.5*   < > 7.9* 8.2* 8.5* 8.4* 8.4*  MG 1.6*  --  1.8 2.0  --  1.3* 1.6*  PHOS 2.8  --   --   --   --   --   --    < > = values in this interval not displayed.    GFR: Estimated Creatinine Clearance: 61.6 mL/min (by C-G formula based on SCr of 0.66 mg/dL).  Liver Function Tests: Recent Labs  Lab 11/28/19 1152 11/29/19 0520 12/02/19 0537  AST 25 20 40  ALT 13 11 17   ALKPHOS 57 42 53  BILITOT 1.5* 1.3* 1.0  PROT 7.5 6.5 6.2*  ALBUMIN 2.9* 2.6* 2.3*    CBG: Recent Labs  Lab 11/30/19 1308 12/01/19 0743 12/02/19 0741 12/03/19 0801 12/04/19 0813   GLUCAP 113* 108* 74 68* 79     Recent Results (from the past 240 hour(s))  Urine culture     Status: Abnormal   Collection Time: 11/28/19 11:29 AM   Specimen: Urine, Random  Result Value Ref Range Status   Specimen Description   Final    URINE, RANDOM Performed at [facility], [place] [address] [place], [place] [address]    Special Requests   Final    NONE Performed at [facility], [place] [address], [place], [place] [address]    Culture (A)  Final    <10,000 COLONIES/mL INSIGNIFICANT GROWTH Performed at [place] [address], [place], [place] [address]    Report Status 11/29/2019 FINAL  Final  SARS CORONAVIRUS 2 (TAT 6-24 HRS) Nasopharyngeal Nasopharyngeal Swab     Status: None   Collection Time: 11/28/19 11:55 AM   Specimen: Nasopharyngeal Swab  Result Value Ref Range Status   SARS Coronavirus 2 NEGATIVE NEGATIVE Final    Comment: (NOTE) SARS-CoV-2 target nucleic acids are NOT DETECTED. The SARS-CoV-2 RNA is generally detectable in upper and lower respiratory specimens during the acute phase of infection. Negative results do not preclude SARS-CoV-2 infection, do not rule out co-infections with other pathogens, and should not be used as the sole basis for treatment or other patient management decisions. Negative results must be combined with clinical observations, patient history, and epidemiological information. The expected result is Negative. Fact Sheet for Patients: SugarRoll.be Fact Sheet for Healthcare Providers: https://www.woods-mathews.com/ This test is not yet approved or cleared by the [place] FDA and  has been authorized for detection and/or diagnosis of SARS-CoV-2 by FDA under an Emergency Use Authorization (EUA). This EUA will remain  in effect (meaning this test can be used) for the duration of the COVID-19 declaration under Section 56 4(b)(1) of the Act, 21  U.S.C. section 360bbb-3(b)(1), unless the authorization is terminated or revoked sooner. Performed at [facility], [place] [address] [place], [place] [address]  Culture, blood (routine x 2)     Status: None   Collection Time: 11/28/19  3:26 PM   Specimen: BLOOD  Result Value Ref Range Status   Specimen Description   Final    BLOOD RIGHT HAND Performed at [place] [address] [place], [place] [address]    Special Requests   Final    BOTTLES DRAWN AEROBIC ONLY Blood Culture results may not be optimal due to an inadequate volume of blood received in culture bottles Performed at [place] [address] [place], [place] [address]    Culture   Final    NO GROWTH 5 DAYS Performed at [facility], [place] [address], [place], [place] [address]    Report Status 12/03/2019 FINAL  Final  Culture, blood (routine x 2)     Status: None   Collection Time: 11/29/19  5:20 AM   Specimen: BLOOD  Result Value Ref Range Status   Specimen Description   Final    BLOOD LEFT ARM Performed at [place] [address] [place], [place] [address]    Special Requests   Final    BOTTLES DRAWN AEROBIC AND ANAEROBIC Blood Culture adequate volume Performed at [place] [address] [place], [place] [address]    Culture   Final    NO GROWTH 5 DAYS Performed at [facility], [place] [address], [place], [place] [address]    Report Status 12/04/2019 FINAL  Final  Body fluid culture     Status: None   Collection Time: 11/29/19 12:02 PM   Specimen: Synovium  Result Value Ref Range Status   Specimen Description   Final    SYNOVIAL Performed at [place] [address] [place], [place] [address]    Special Requests LOOK FOR GC  Final   Gram Stain   Final    MODERATE WBC PRESENT, PREDOMINANTLY PMN NO ORGANISMS SEEN    Culture   Final    NO GROWTH 3 DAYS Performed at  [facility], [place] [address], [place], [place] [address]    Report Status 12/02/2019 FINAL  Final  Anaerobic culture     Status: None   Collection Time: 11/29/19 12:02 PM   Specimen: Synovium  Result Value Ref Range Status   Specimen Description   Final    SYNOVIAL Performed at [place] [address], [place], [place] [address]    Special Requests   Final    NONE Performed at [facility], [place] [address], [place], [place] [address]    Culture   Final    NO ANAEROBES ISOLATED Performed at [facility], [place] [address] [place], [place] [address]    Report Status 12/04/2019 FINAL  Final         Radiology Studies: No results found.      Scheduled Meds: . allopurinol  100 mg Oral Daily  . colchicine  0.6 mg Oral BID  . cyanocobalamin  1,000 mcg Subcutaneous Daily  . diltiazem  240 mg Oral Daily  . feeding supplement (ENSURE ENLIVE)   Oral TID BM  . ferrous sulfate  325 mg Oral Q breakfast  . folic acid  1 mg Oral Daily  . heparin  5,000 Units Subcutaneous Q8H  . magnesium oxide  400 mg Oral BID  . metroNIDAZOLE  500 mg Oral TID  . multivitamin  1 tablet  Oral Daily  . pantoprazole  40 mg Oral Daily  . thiamine  100 mg Oral Daily   Or  . thiamine  100 mg Intravenous Daily   Continuous Infusions: . cefTRIAXone (ROCEPHIN)  IV 2 g (12/04/19 0924)  . magnesium sulfate bolus IVPB 4 g (12/04/19 1106)     LOS: 5 days    Time spent: 35 minutes    Irine Seal, MD [facility]   To contact the attending provider between 7A-7P or the covering provider during after hours 7P-7A, please log into the web site [email or web address] and access using universal [place] password for that web site. If you do not have the password, please call the hospital operator.  12/04/2019, 12:39 PM

## 2019-12-05 DIAGNOSIS — R531 Weakness: Secondary | ICD-10-CM | POA: Diagnosis not present

## 2019-12-05 DIAGNOSIS — M109 Gout, unspecified: Secondary | ICD-10-CM | POA: Diagnosis not present

## 2019-12-05 DIAGNOSIS — A419 Sepsis, unspecified organism: Secondary | ICD-10-CM | POA: Diagnosis not present

## 2019-12-05 DIAGNOSIS — K529 Noninfective gastroenteritis and colitis, unspecified: Secondary | ICD-10-CM | POA: Diagnosis not present

## 2019-12-05 LAB — BASIC METABOLIC PANEL
Anion gap: 7 (ref 5–15)
BUN: 6 mg/dL — ABNORMAL LOW (ref 8–23)
CO2: 27 mmol/L (ref 22–32)
Calcium: 8.4 mg/dL — ABNORMAL LOW (ref 8.9–10.3)
Chloride: 98 mmol/L (ref 98–111)
Creatinine, Ser: 0.69 mg/dL (ref 0.61–1.24)
GFR calc Af Amer: >60 mL/min (ref >60)
GFR calc non Af Amer: >60 mL/min (ref >60)
Glucose, Bld: 97 mg/dL (ref 70–99)
Potassium: 3.9 mmol/L (ref 3.5–5.1)
Sodium: 132 mmol/L — ABNORMAL LOW (ref 135–145)

## 2019-12-05 LAB — CBC
HCT: 23.3 % — ABNORMAL LOW (ref 39.0–52.0)
Hemoglobin: 8 g/dL — ABNORMAL LOW (ref 13.0–17.0)
MCH: 38.3 pg — ABNORMAL HIGH (ref 26.0–34.0)
MCHC: 34.3 g/dL (ref 30.0–36.0)
MCV: 111.5 fL — ABNORMAL HIGH (ref 80.0–100.0)
Platelets: 585 10*3/uL — ABNORMAL HIGH (ref 150–400)
RBC: 2.09 MIL/uL — ABNORMAL LOW (ref 4.22–5.81)
RDW: 14.7 % (ref 11.5–15.5)
WBC: 8.1 10*3/uL (ref 4.0–10.5)
nRBC: 0.2 % (ref 0.0–0.2)

## 2019-12-05 LAB — MAGNESIUM: Magnesium: 1.8 mg/dL (ref 1.7–2.4)

## 2019-12-05 LAB — GLUCOSE, CAPILLARY: Glucose-Capillary: 79 mg/dL (ref 70–99)

## 2019-12-05 MED ORDER — MAGNESIUM SULFATE 2 GM/50ML IV SOLN
2.0000 g | Freq: Once | INTRAVENOUS | Status: AC
  Administered 2019-12-05: 2 g via INTRAVENOUS
  Filled 2019-12-05: qty 50

## 2019-12-05 MED ORDER — FUROSEMIDE 10 MG/ML IJ SOLN
20.0000 mg | Freq: Once | INTRAMUSCULAR | Status: AC
  Administered 2019-12-05: 20 mg via INTRAVENOUS
  Filled 2019-12-05: qty 2

## 2019-12-05 MED ORDER — COLCHICINE 0.6 MG PO TABS
0.6000 mg | ORAL_TABLET | Freq: Every day | ORAL | Status: DC
  Administered 2019-12-06 – 2019-12-07 (×2): 0.6 mg via ORAL
  Filled 2019-12-05 (×2): qty 1

## 2019-12-05 NOTE — Progress Notes (Addendum)
PROGRESS NOTE    Shawn Hendrix  [id number] DOB: 07/23/1951 DOA: 11/28/2019 PCP: Javier Docker, MD    Chief Complaint  Patient presents with  . Foot Swelling  . Arm Swelling    hands    Brief Narrative:  69 year old with past medical history significant for gout, hypertension, history of prostate cancer alcohol withdrawal and seizure who presents complaining of generalized weakness and deconditioning and left knee pain and hand pain.  Patient in the ED CT head without contrast no acute finding.  MRI negative for acute stroke. Patient did spike fever overnight temperature 101.  MRI of the left knee showed large effusion.  He underwent arthrocentesis on 4/12.  Cell count and synovial fluid 21,000, extracellular monosodium urate crystals present. Also developed episode of vomiting, CT abdomen and pelvis showed mild wall thickening and submucosal edema involving the ascending colon cold suggest an inflammatory or infectious colitis.   Assessment & Plan:   Active Problems:   Generalized weakness   Acute left-sided weakness   Acute gouty arthropathy   Sepsis ([place])   Colitis   Folate deficiency   Alcohol abuse   Hypomagnesemia   Essential hypertension  1 sepsis likely secondary to colitis Patient had presented with leukocytosis, fever with a temp of 101, tachypnea, tachycardia meeting criteria for sepsis.  CT abdomen and pelvis done concerning for mild wall thickness submucosal edema involving the ascending colon suggesting inflammatory versus infectious colitis.  Urinalysis with nitrite negative, leukocytes negative.  Urine cultures with insignificant growth.  Blood cultures with no growth to date.  Cultures from arthrocentesis negative.  IV Vanco has been discontinued.  IV cefepime narrowed to IV Rocephin.  IV Flagyl transition to oral Flagyl.  Diet advanced to a soft diet which he is tolerating.  Follow.   2.  Acute gouty arthritis of the left knee/left knee joint  effusion Chronic alcohol use likely playing a role.  Alcohol cessation stressed to patient.  Patient seen in consultation by orthopedics.  Patient underwent arthrocentesis 11/29/2019 with synovial fluid showing 21,000 white blood cells, monosodium urate crystals.  Cultures negative.  Patient improved clinically.  Patient has had a 7-day course of colchicine 0.6 mg twice daily.  Decrease colchicine to 0.6 mg daily.  Continue allopurinol.  Was assessed by orthopedics during the hospitalization.  Supportive care.   3.  Meniscus tear per MRI Per orthopedics.  4.  Generalized weakness/left-sided weakness/failure to thrive/vitamin B12 deficiency/folate deficiency MRI negative for any acute abnormalities.  Left hand and left knee pain improving daily.  Continue folic acid 1 mg daily.  Continue vitamin B12 subcutaneously daily x 4 more days, and then weekly, and then monthly.  Could also potentially transition to oral vitamin B12 supplementation on discharge.  PT/OT.  Needs SNF.   5.  Alcohol abuse Alcohol cessation stressed to patient.  Continue the Ativan withdrawal protocol, folic acid, multivitamin, thiamine.  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  Erosive changes to the ulnar styloid suggesting an underlying inflammatory arthropathy such as rheumatoid arthritis.  Patient currently being treated for an acute gouty arthritis.  Outpatient follow-up.  7.  Hypomagnesemia Magnesium at 1.8 this morning.  Magnesium sulfate 2 g IV x1.  Continue oral magnesium supplementation.  Repeat labs in the morning.  8.  Hypertension Continue Cardizem.   9.  Hyperbilirubinemia Trending down. Follow.  10.  Hypokalemia Repleted.  Potassium at 3.9. Follow.  11.  Murmur Check a 2D echo.   DVT prophylaxis: Heparin Code Status: Full Family Communication: Updated patient.  No family at bedside. Disposition:   Status is:  Inpatient    Dispo: The patient is from: Home              Anticipated d/c is to: SNF              Anticipated d/c date is: Likely to skilled nursing facility on 12/06/2019 when bed available.                Patient currently improving slowly and clinically.  Likely to skilled nursing facility once course of antibiotics have been completed and on oral antibiotics hopefully tomorrow.        Consultants:   Orthopedics: Dr. Lorin Mercy 11/29/2019    Procedures:   CT abdomen and pelvis 11/29/2019  CT head 11/28/2019  MRI brain 11/28/2019  MRI left knee 11/29/2019  Left knee aspiration/arthrocentesis 11/29/2019  Antimicrobials:   .  IV cefepime 11/29/2019>>>>> 12/02/2019  IV Flagyl 11/29/2019>>>>>>> 12/04/2019  IV vancomycin 11/29/2019>>> 11/30/2019  IV Rocephin 12/02/2019  Oral Flagyl 12/04/2019   Subjective: Patient states feeling better.  Denies chest pain or shortness of breath.  Left knee and left ankle and left hand pain improved.  Tolerating current diet.  No abdominal pain.   Objective: Vitals:   12/04/19 0507 12/04/19 1652 12/04/19 2039 12/05/19 0647  BP: (!) 143/86 116/78 117/77 118/72  Pulse: 80 84 74 74  Resp: (!) 21 20 20 18   Temp: 98.4 F (36.9 C) 98.2 F (36.8 C) 98.7 F (37.1 C) 98.8 F (37.1 C)  TempSrc: Oral Oral Oral Oral  SpO2: 100% 100% 100% 93%  Weight:    57.4 kg  Height:        Intake/Output Summary (Last 24 hours) at 12/05/2019 1251 Last data filed at 12/05/2019 0906 Gross per 24 hour  Intake 240 ml  Output 1900 ml  Net -1660 ml   Filed Weights   12/03/19 0600 12/04/19 0500 12/05/19 0647  Weight: 55.7 kg 55.3 kg 57.4 kg    Examination:  General exam: NAD. Respiratory system: Lungs clear to auscultation anterior lung fields.  No wheezes, no crackles, no rhonchi.  Speaking in full sentences.  Normal respiratory effort.   Cardiovascular system: RRR with 3/6 SEM.  No JVD.  No lower extremity edema. Gastrointestinal system: Abdomen is  nontender, nondistended, soft, positive bowel sounds.  No rebound.  No guarding.   Central nervous system: Alert and oriented. No focal neurological deficits. Extremities: Left knee nontender to palpation.  Left hand decreased tenderness to palpation. Skin: No rashes, lesions or ulcers Psychiatry: Judgement and insight appear normal. Mood & affect appropriate.     Data Reviewed: I have personally reviewed following labs and imaging studies  CBC: Recent Labs  Lab 11/29/19 0520 11/29/19 0520 12/01/19 0618 12/02/19 0537 12/03/19 0502 12/04/19 0610 12/05/19 0553  WBC 10.3   < > 12.1* 13.4* 11.7* 9.5 8.1  NEUTROABS 7.9*  --  9.9* 10.8*  --  6.7  --   HGB 8.7*   < > 8.0* 8.5* 7.9* 8.6* 8.0*  HCT 25.7*   < > 23.7* 26.6* 24.0* 23.7* 23.3*  MCV 112.2*   < > 115.6* 114.7* 111.6* 113.4* 111.5*  PLT 273   < > 430* 522* 522* 621* 585*   < > = values in this interval not displayed.    Basic Metabolic Panel: Recent Labs  Lab 11/29/19 0520  11/29/19 0520 11/30/19 [id number] 11/30/19 0537 12/01/19 0618 12/02/19 0537 12/03/19 0502 12/04/19 0610 12/05/19 0553  NA 131*   < > 132*   < > 133* 135 134* 134* 132*  K 3.9   < > 3.9   < > 3.3* 3.8 3.1* 3.5 3.9  CL 93*   < > 101   < > 102 106 103 101 98  CO2 27   < > 23   < > 21* 21* 22 24 27   GLUCOSE 112*   < > 80   < > 118* 88 87 82 97  BUN 23   < > 19   < > 18 14 10  7* 6*  CREATININE 1.02   < > 0.91   < > 0.85 0.77 0.69 0.66 0.69  CALCIUM 8.5*   < > 7.9*   < > 8.2* 8.5* 8.4* 8.4* 8.4*  MG 1.6*   < > 1.8  --  2.0  --  1.3* 1.6* 1.8  PHOS 2.8  --   --   --   --   --   --   --   --    < > = values in this interval not displayed.    GFR: Estimated Creatinine Clearance: 61.6 mL/min (by C-G formula based on SCr of 0.69 mg/dL).  Liver Function Tests: Recent Labs  Lab 11/29/19 0520 12/02/19 0537  AST 20 40  ALT 11 17  ALKPHOS 42 53  BILITOT 1.3* 1.0  PROT 6.5 6.2*  ALBUMIN 2.6* 2.3*    CBG: Recent Labs  Lab 12/01/19 0743  12/02/19 0741 12/03/19 0801 12/04/19 0813 12/05/19 0749  GLUCAP 108* 74 68* 79 79     Recent Results (from the past 240 hour(s))  Urine culture     Status: Abnormal   Collection Time: 11/28/19 11:29 AM   Specimen: Urine, Random  Result Value Ref Range Status   Specimen Description   Final    URINE, RANDOM Performed at [facility], [place] [address], [place], [place] [address]    Special Requests   Final    NONE Performed at [facility], [place] [address] [place], [place] [address]    Culture (A)  Final    <10,000 COLONIES/mL INSIGNIFICANT GROWTH Performed at [facility] [address], [place], [place] [address]    Report Status 11/29/2019 FINAL  Final  SARS CORONAVIRUS 2 (TAT 6-24 HRS) Nasopharyngeal Nasopharyngeal Swab     Status: None   Collection Time: 11/28/19 11:55 AM   Specimen: Nasopharyngeal Swab  Result Value Ref Range Status   SARS Coronavirus 2 NEGATIVE NEGATIVE Final    Comment: (NOTE) SARS-CoV-2 target nucleic acids are NOT DETECTED. The SARS-CoV-2 RNA is generally detectable in upper and lower respiratory specimens during the acute phase of infection. Negative results do not preclude SARS-CoV-2 infection, do not rule out co-infections with other pathogens, and should not be used as the sole basis for treatment or other patient management decisions. Negative results must be combined with clinical observations, patient history, and epidemiological information. The expected result is Negative. Fact Sheet for Patients: SugarRoll.be Fact Sheet for Healthcare Providers: https://www.woods-mathews.com/ This test is not yet approved or cleared by the [place] FDA and  has been authorized for detection and/or diagnosis of SARS-CoV-2 by FDA under an Emergency Use Authorization (EUA). This EUA will remain  in effect (meaning this test can be used) for the duration of  the COVID-19 declaration under Section 56  4(b)(1) of the Act, 21 U.S.C. section 360bbb-3(b)(1), unless the authorization is terminated or revoked sooner. Performed at [facility], [place] [address] [place], [place] [address]   Culture, blood (routine x 2)     Status: None   Collection Time: 11/28/19  3:26 PM   Specimen: BLOOD  Result Value Ref Range Status   Specimen Description   Final    BLOOD RIGHT HAND Performed at [facility] [address], [place], [place] [address]    Special Requests   Final    BOTTLES DRAWN AEROBIC ONLY Blood Culture results may not be optimal due to an inadequate volume of blood received in culture bottles Performed at [facility] [address], [place], [place] [address]    Culture   Final    NO GROWTH 5 DAYS Performed at [facility], [place] [address], [place], [place] [address]    Report Status 12/03/2019 FINAL  Final  Culture, blood (routine x 2)     Status: None   Collection Time: 11/29/19  5:20 AM   Specimen: BLOOD  Result Value Ref Range Status   Specimen Description   Final    BLOOD LEFT ARM Performed at [facility] [address] [place], [place] [address]    Special Requests   Final    BOTTLES DRAWN AEROBIC AND ANAEROBIC Blood Culture adequate volume Performed at [facility] [address] [place], [place] [address]    Culture   Final    NO GROWTH 5 DAYS Performed at [facility], [place] [address], [place], [place] [address]    Report Status 12/04/2019 FINAL  Final  Body fluid culture     Status: None   Collection Time: 11/29/19 12:02 PM   Specimen: Synovium  Result Value Ref Range Status   Specimen Description   Final    SYNOVIAL Performed at [facility] [address] [place], [place] [address]    Special Requests LOOK FOR GC  Final   Gram Stain   Final    MODERATE WBC PRESENT, PREDOMINANTLY PMN NO  ORGANISMS SEEN    Culture   Final    NO GROWTH 3 DAYS Performed at [facility], [place] [address] [place], [place] [address]    Report Status 12/02/2019 FINAL  Final  Anaerobic culture     Status: None   Collection Time: 11/29/19 12:02 PM   Specimen: Synovium  Result Value Ref Range Status   Specimen Description   Final    SYNOVIAL Performed at [facility] [address] [place], [place] [address]    Special Requests   Final    NONE Performed at [facility], [place] [address] [place], [place] [address]    Culture   Final    NO ANAEROBES ISOLATED Performed at [facility], [place] [address] [place],  [address]    Report Status 12/04/2019 FINAL  Final         Radiology Studies: No results found.      Scheduled Meds: . allopurinol  100 mg Oral Daily  . colchicine  0.6 mg Oral BID  . cyanocobalamin  1,000 mcg Subcutaneous Daily  . diltiazem  240 mg Oral Daily  . feeding supplement (ENSURE ENLIVE)   Oral TID BM  . ferrous sulfate  325 mg Oral Q breakfast  . folic acid  1 mg Oral Daily  .  heparin  5,000 Units Subcutaneous Q8H  . magnesium oxide  400 mg Oral BID  . metroNIDAZOLE  500 mg Oral TID  . multivitamin  1 tablet Oral Daily  . pantoprazole  40 mg Oral Daily  . thiamine  100 mg Oral Daily   Or  . thiamine  100 mg Intravenous Daily   Continuous Infusions: . cefTRIAXone (ROCEPHIN)  IV 2 g (12/05/19 1022)  . magnesium sulfate bolus IVPB 2 g (12/05/19 1225)     LOS: 6 days    Time spent: 35 minutes    Irine Seal, MD [facility]   To contact the attending provider between 7A-7P or the covering provider during after hours 7P-7A, please log into the web site [email or web address] and access using universal [place] password for that web site. If you do not have the password, please call the hospital operator.  12/05/2019, 12:51 PM

## 2019-12-05 NOTE — Progress Notes (Signed)
Physical Therapy Treatment Patient Details Name: Shawn Hendrix MRN: [id number] DOB: [birth year] Today's Date: 12/05/2019    History of Present Illness 69 yo male admitted with weakness, falls. L knee pain, FTT. Hx of gout, OA, seizures, ETOH use, prostate ca. MRI (+) L knee complex meniscus tear    PT Comments    Pt continues very cooperative and with noted improvement in activity tolerance but with continued poor standing/ambulatory balance placing pt at high fall risk.   Follow Up Recommendations  SNF     Equipment Recommendations       Recommendations for Other Services       Precautions / Restrictions Precautions Precautions: Fall Precaution Comments: high fall risk Restrictions Weight Bearing Restrictions: No    Mobility  Bed Mobility Overal bed mobility: Needs Assistance Bed Mobility: Supine to Sit     Supine to sit: Supervision     General bed mobility comments: Increased time but no physical assist  Transfers Overall transfer level: Needs assistance Equipment used: Rolling walker (2 wheeled) Transfers: Sit to/from Stand Sit to Stand: Min assist         General transfer comment: cues for safe transition position and use of UEs to self assist.  Physical assist to bring wt up and fwd and to balance in standing  Ambulation/Gait Ambulation/Gait assistance: Min assist Gait Distance (Feet): 220 Feet Assistive device: Rolling walker (2 wheeled) Gait Pattern/deviations: Step-through pattern;Decreased step length - right;Decreased step length - left;Shuffle;Trunk flexed;Narrow base of support     General Gait Details: cues for posture, pace, position from RW and to increase BOS   Stairs             Wheelchair Mobility    Modified Rankin (Stroke Patients Only)       Balance Overall balance assessment: Needs assistance;History of Falls Sitting-balance support: Feet supported Sitting balance-Leahy Scale: Good     Standing balance  support: During functional activity;Bilateral upper extremity supported Standing balance-Leahy Scale: Poor Standing balance comment: very reliant on RW to maintain balance                            Cognition Arousal/Alertness: Awake/alert Behavior During Therapy: WFL for tasks assessed/performed Overall Cognitive Status: No family/caregiver present to determine baseline cognitive functioning                                 General Comments: mumbles speech, follows directions appropriately      Exercises      General Comments        Pertinent Vitals/Pain Pain Assessment: No/denies pain    Home Living                      Prior Function            PT Goals (current goals can now be found in the care plan section) Acute Rehab PT Goals Patient Stated Goal: to get strength back PT Goal Formulation: With patient Time For Goal Achievement: 12/13/19 Potential to Achieve Goals: Fair Progress towards PT goals: Progressing toward goals    Frequency    Min 2X/week      PT Plan Current plan remains appropriate    Co-evaluation              AM-PAC PT "6 Clicks" Mobility   Outcome Measure  Help needed turning from your  back to your side while in a flat bed without using bedrails?: A Little Help needed moving from lying on your back to sitting on the side of a flat bed without using bedrails?: A Little Help needed moving to and from a bed to a chair (including a wheelchair)?: A Little Help needed standing up from a chair using your arms (e.g., wheelchair or bedside chair)?: A Little Help needed to walk in hospital room?: A Little Help needed climbing 3-5 steps with a railing? : A Lot 6 Click Score: 17    End of Session Equipment Utilized During Treatment: Gait belt Activity Tolerance: Patient tolerated treatment well Patient left: in chair;with call bell/phone within reach;with chair alarm set;with nursing/sitter in room Nurse  Communication: Mobility status PT Visit Diagnosis: Muscle weakness (generalized) (M62.81);History of falling (Z91.81);Adult, failure to thrive (R62.7)     Time: [id number] PT Time Calculation (min) (ACUTE ONLY): 15 min  Charges:  $Gait Training: 8-22 mins                     [place] Pager [phone] Office [phone]    Shawn Hendrix 12/05/2019, 2:44 PM

## 2019-12-06 ENCOUNTER — Inpatient Hospital Stay (HOSPITAL_COMMUNITY): Payer: Medicare Other

## 2019-12-06 DIAGNOSIS — A419 Sepsis, unspecified organism: Secondary | ICD-10-CM | POA: Diagnosis not present

## 2019-12-06 DIAGNOSIS — K529 Noninfective gastroenteritis and colitis, unspecified: Secondary | ICD-10-CM | POA: Diagnosis not present

## 2019-12-06 DIAGNOSIS — M109 Gout, unspecified: Secondary | ICD-10-CM | POA: Diagnosis not present

## 2019-12-06 DIAGNOSIS — R531 Weakness: Secondary | ICD-10-CM | POA: Diagnosis not present

## 2019-12-06 DIAGNOSIS — I340 Nonrheumatic mitral (valve) insufficiency: Secondary | ICD-10-CM | POA: Diagnosis not present

## 2019-12-06 LAB — CBC
HCT: 25.6 % — ABNORMAL LOW (ref 39.0–52.0)
Hemoglobin: 8.3 g/dL — ABNORMAL LOW (ref 13.0–17.0)
MCH: 35.6 pg — ABNORMAL HIGH (ref 26.0–34.0)
MCHC: 32.4 g/dL (ref 30.0–36.0)
MCV: 109.9 fL — ABNORMAL HIGH (ref 80.0–100.0)
Platelets: 583 10*3/uL — ABNORMAL HIGH (ref 150–400)
RBC: 2.33 MIL/uL — ABNORMAL LOW (ref 4.22–5.81)
RDW: 15.2 % (ref 11.5–15.5)
WBC: 8 10*3/uL (ref 4.0–10.5)
nRBC: 0.4 % — ABNORMAL HIGH (ref 0.0–0.2)

## 2019-12-06 LAB — MAGNESIUM: Magnesium: 1.6 mg/dL — ABNORMAL LOW (ref 1.7–2.4)

## 2019-12-06 LAB — ECHOCARDIOGRAM COMPLETE
Height: 60 in
Weight: 1887.14 oz

## 2019-12-06 LAB — BASIC METABOLIC PANEL
Anion gap: 9 (ref 5–15)
BUN: 6 mg/dL — ABNORMAL LOW (ref 8–23)
CO2: 27 mmol/L (ref 22–32)
Calcium: 8.7 mg/dL — ABNORMAL LOW (ref 8.9–10.3)
Chloride: 95 mmol/L — ABNORMAL LOW (ref 98–111)
Creatinine, Ser: 0.79 mg/dL (ref 0.61–1.24)
GFR calc Af Amer: >60 mL/min (ref >60)
GFR calc non Af Amer: >60 mL/min (ref >60)
Glucose, Bld: 87 mg/dL (ref 70–99)
Potassium: 3.8 mmol/L (ref 3.5–5.1)
Sodium: 131 mmol/L — ABNORMAL LOW (ref 135–145)

## 2019-12-06 LAB — SARS CORONAVIRUS 2 (TAT 6-24 HRS): SARS Coronavirus 2: NEGATIVE

## 2019-12-06 LAB — GLUCOSE, CAPILLARY: Glucose-Capillary: 100 mg/dL — ABNORMAL HIGH (ref 70–99)

## 2019-12-06 MED ORDER — MAGNESIUM SULFATE 4 GM/100ML IV SOLN
4.0000 g | Freq: Once | INTRAVENOUS | Status: AC
  Administered 2019-12-06: 10:00:00 4 g via INTRAVENOUS
  Filled 2019-12-06: qty 100

## 2019-12-06 NOTE — Progress Notes (Signed)
PROGRESS NOTE    Shawn Hendrix  [id number] DOB: [birth year] DOA: 11/28/2019 PCP: Javier Docker, MD    Chief Complaint  Patient presents with  . Foot Swelling  . Arm Swelling    hands    Brief Narrative:  69 year old with past medical history significant for gout, hypertension, history of prostate cancer alcohol withdrawal and seizure who presents complaining of generalized weakness and deconditioning and left knee pain and hand pain.  Patient in the ED CT head without contrast no acute finding.  MRI negative for acute stroke. Patient did spike fever overnight temperature 101.  MRI of the left knee showed large effusion.  He underwent arthrocentesis on 4/12.  Cell count and synovial fluid 21,000, extracellular monosodium urate crystals present. Also developed episode of vomiting, CT abdomen and pelvis showed mild wall thickening and submucosal edema involving the ascending colon cold suggest an inflammatory or infectious colitis.   Assessment & Plan:   Active Problems:   Generalized weakness   Acute left-sided weakness   Acute gouty arthropathy   Sepsis ([place])   Colitis   Folate deficiency   Alcohol abuse   Hypomagnesemia   Essential hypertension  1 sepsis likely secondary to colitis Patient had presented with leukocytosis, fever with a temp of 101, tachypnea, tachycardia meeting criteria for sepsis.  CT abdomen and pelvis done concerning for mild wall thickness submucosal edema involving the ascending colon suggesting inflammatory versus infectious colitis.  Urinalysis with nitrite negative, leukocytes negative.  Urine cultures with insignificant growth.  Blood cultures with no growth to date.  Cultures from arthrocentesis negative.  IV Vanco has been discontinued.  IV cefepime narrowed to IV Rocephin.  IV Flagyl transition to oral Flagyl.  Diet advanced to a soft diet which he is tolerating.  Follow.   2.  Acute gouty arthritis of the left knee/left knee joint  effusion Chronic alcohol use likely playing a role.  Alcohol cessation stressed to patient.  Patient seen in consultation by orthopedics.  Patient underwent arthrocentesis 11/29/2019 with synovial fluid showing 21,000 white blood cells, monosodium urate crystals.  Cultures negative.  Patient improved clinically.  Patient has had a 7-day course of colchicine 0.6 mg twice daily and colchicine has been decreased to 0.6 mg daily.  Continue allopurinol.  Alcohol cessation stressed to patient.  Supportive care.  3.  Meniscus tear per MRI Per orthopedics.  4.  Generalized weakness/left-sided weakness/failure to thrive/vitamin B12 deficiency/folate deficiency MRI negative for any acute abnormalities.  Left hand and left knee pain improving daily.  Continue folic acid 1 mg daily.  Continue vitamin B12 subcutaneously daily x 4 more days, and then weekly, and then monthly.  Could also potentially transition to oral vitamin B12 supplementation on discharge.  PT/OT.  Needs SNF.   5.  Alcohol abuse Alcohol cessation stressed to patient.  Continue the Ativan withdrawal protocol, folic acid, multivitamin, thiamine.  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  Erosive changes to the ulnar styloid suggesting an underlying inflammatory arthropathy such as rheumatoid arthritis.  Patient currently being treated for an acute gouty arthritis.  Outpatient follow-up.  7.  Hypomagnesemia Magnesium at 1.6 this morning.  Magnesium sulfate 4 g IV x1.  Continue oral magnesium supplementation.  Repeat labs in the morning.  8.  Hypertension Continue Cardizem.   9.  Hyperbilirubinemia Trending down. Follow.  10.  Hypokalemia Repleted.  Potassium at 3.8. Follow.  11.  Murmur 2D echo with EF of 60 to 65%, no wall motion abnormalities, moderate asymmetric left ventricular hypertrophy of basal/septal segment, left ventricular diastolic  parameters indeterminate.  Moderate mitral valve regurgitation.  No mitral stenosis.  No aortic stenosis present.  Aortic dilatation noted.  Aneurysm of ascending aorta measuring 38 mm.  Outpatient follow-up.    DVT prophylaxis: Heparin Code Status: Full Family Communication: Updated patient.  No family at bedside. Disposition:   Status is: Inpatient    Dispo: The patient is from: Home              Anticipated d/c is to: SNF              Anticipated d/c date is: Likely to skilled nursing facility on 12/07/2019 when bed available.                Patient currently awaiting SNF placement.         Consultants:   Orthopedics: Dr. Lorin Mercy 11/29/2019    Procedures:   CT abdomen and pelvis 11/29/2019  CT head 11/28/2019  MRI brain 11/28/2019  MRI left knee 11/29/2019  Left knee aspiration/arthrocentesis 11/29/2019  Antimicrobials:   .  IV cefepime 11/29/2019>>>>> 12/02/2019  IV Flagyl 11/29/2019>>>>>>> 12/04/2019  IV vancomycin 11/29/2019>>> 11/30/2019  IV Rocephin 12/02/2019  Oral Flagyl 12/04/2019   Subjective: Patient in bed.  Denies chest pain or shortness of breath.  States left knee left ankle and left hand pain improving.  Tolerating current diet.  No abdominal pain.     Objective: Vitals:   12/05/19 1215 12/05/19 2109 12/06/19 0500 12/06/19 0523  BP: 129/86 126/81  121/78  Pulse: 80 84  83  Resp:  18  18  Temp: 98.9 F (37.2 C) 98.9 F (37.2 C)  98.8 F (37.1 C)  TempSrc: Oral Oral  Oral  SpO2: 98% 100%  96%  Weight:   53.5 kg   Height:        Intake/Output Summary (Last 24 hours) at 12/06/2019 1119 Last data filed at 12/06/2019 0600 Gross per 24 hour  Intake 320 ml  Output 1650 ml  Net -1330 ml   Filed Weights   12/04/19 0500 12/05/19 0647 12/06/19 0500  Weight: 55.3 kg 57.4 kg 53.5 kg    Examination:  General exam: NAD. Respiratory system: CTAB.  No wheezes, no crackles, no rhonchi.  Normal respiratory effort.  Speaking in full sentences.    Cardiovascular system: RRR with 3/6 SEM.  No lower extremity edema.  No JVD.  Gastrointestinal system: Abdomen is soft, nontender, nondistended, positive bowel sounds.  No rebound.  No guarding. Central nervous system: Alert and oriented. No focal neurological deficits. Extremities: Left knee nontender to palpation.  Left hand decreased tenderness to palpation. Skin: No rashes, lesions or ulcers Psychiatry: Judgement and insight appear normal. Mood & affect appropriate.     Data Reviewed: I have personally reviewed following labs and imaging studies  CBC: Recent Labs  Lab 12/01/19 0618 12/01/19 0618 12/02/19 0537 12/03/19 0502 12/04/19 0610 12/05/19 0553 12/06/19 0626  WBC 12.1*   < > 13.4* 11.7* 9.5 8.1 8.0  NEUTROABS 9.9*  --  10.8*  --  6.7  --   --   HGB 8.0*   < > 8.5* 7.9* 8.6* 8.0* 8.3*  HCT 23.7*   < > 26.6* 24.0* 23.7* 23.3* 25.6*  MCV 115.6*   < > 114.7* 111.6* 113.4* 111.5* 109.9*  PLT 430*   < > 522* 522* 621* 585* 583*   < > =  values in this interval not displayed.    Basic Metabolic Panel: Recent Labs  Lab 12/01/19 0618 12/01/19 0618 12/02/19 0537 12/03/19 0502 12/04/19 0610 12/05/19 0553 12/06/19 0626  NA 133*   < > 135 134* 134* 132* 131*  K 3.3*   < > 3.8 3.1* 3.5 3.9 3.8  CL 102   < > 106 103 101 98 95*  CO2 21*   < > 21* 22 24 27 27   GLUCOSE 118*   < > 88 87 82 97 87  BUN 18   < > 14 10 7* 6* 6*  CREATININE 0.85   < > 0.77 0.69 0.66 0.69 0.79  CALCIUM 8.2*   < > 8.5* 8.4* 8.4* 8.4* 8.7*  MG 2.0  --   --  1.3* 1.6* 1.8 1.6*   < > = values in this interval not displayed.    GFR: Estimated Creatinine Clearance: 61.6 mL/min (by C-G formula based on SCr of 0.79 mg/dL).  Liver Function Tests: Recent Labs  Lab 12/02/19 0537  AST 40  ALT 17  ALKPHOS 53  BILITOT 1.0  PROT 6.2*  ALBUMIN 2.3*    CBG: Recent Labs  Lab 12/02/19 0741 12/03/19 0801 12/04/19 0813 12/05/19 0749 12/06/19 0848  GLUCAP 74 68* 79 79 100*     Recent  Results (from the past 240 hour(s))  Urine culture     Status: Abnormal   Collection Time: 11/28/19 11:29 AM   Specimen: Urine, Random  Result Value Ref Range Status   Specimen Description   Final    URINE, RANDOM Performed at [facility], [place] [address], [place], [place] [address]    Special Requests   Final    NONE Performed at [facility], [place] [address], [place], [place] [address]    Culture (A)  Final    <10,000 COLONIES/mL INSIGNIFICANT GROWTH Performed at [place] [address] [place], [place] [address]    Report Status 11/29/2019 FINAL  Final  SARS CORONAVIRUS 2 (TAT 6-24 HRS) Nasopharyngeal Nasopharyngeal Swab     Status: None   Collection Time: 11/28/19 11:55 AM   Specimen: Nasopharyngeal Swab  Result Value Ref Range Status   SARS Coronavirus 2 NEGATIVE NEGATIVE Final    Comment: (NOTE) SARS-CoV-2 target nucleic acids are NOT DETECTED. The SARS-CoV-2 RNA is generally detectable in upper and lower respiratory specimens during the acute phase of infection. Negative results do not preclude SARS-CoV-2 infection, do not rule out co-infections with other pathogens, and should not be used as the sole basis for treatment or other patient management decisions. Negative results must be combined with clinical observations, patient history, and epidemiological information. The expected result is Negative. Fact Sheet for Patients: SugarRoll.be Fact Sheet for Healthcare Providers: https://www.woods-mathews.com/ This test is not yet approved or cleared by the [place] FDA and  has been authorized for detection and/or diagnosis of SARS-CoV-2 by FDA under an Emergency Use Authorization (EUA). This EUA will remain  in effect (meaning this test can be used) for the duration of the COVID-19 declaration under Section 56 4(b)(1) of the Act, 21 U.S.C. section 360bbb-3(b)(1), unless the  authorization is terminated or revoked sooner. Performed at [facility], [place] [address] [place],  [address]   Culture, blood (routine x 2)     Status: None   Collection Time: 11/28/19  3:26 PM   Specimen: BLOOD  Result Value Ref Range Status   Specimen Description  Final    BLOOD RIGHT HAND Performed at [facility], [place] [address], [place], [place] [address]    Special Requests   Final    BOTTLES DRAWN AEROBIC ONLY Blood Culture results may not be optimal due to an inadequate volume of blood received in culture bottles Performed at [place] [address] [place], [place] [address]    Culture   Final    NO GROWTH 5 DAYS Performed at [facility], [place] [address], [place], [place] [address]    Report Status 12/03/2019 FINAL  Final  Culture, blood (routine x 2)     Status: None   Collection Time: 11/29/19  5:20 AM   Specimen: BLOOD  Result Value Ref Range Status   Specimen Description   Final    BLOOD LEFT ARM Performed at [place] [address], [place], [place] [address]    Special Requests   Final    BOTTLES DRAWN AEROBIC AND ANAEROBIC Blood Culture adequate volume Performed at [place] [address], [place], [place] [address]    Culture   Final    NO GROWTH 5 DAYS Performed at [facility], [place] [address] [place], [place] [address]    Report Status 12/04/2019 FINAL  Final  Body fluid culture     Status: None   Collection Time: 11/29/19 12:02 PM   Specimen: Synovium  Result Value Ref Range Status   Specimen Description   Final    SYNOVIAL Performed at [place] [address] [place], [place] [address]    Special Requests LOOK FOR GC  Final   Gram Stain   Final    MODERATE WBC PRESENT, PREDOMINANTLY PMN NO ORGANISMS SEEN    Culture   Final    NO GROWTH 3 DAYS Performed at [facility], [place] [address]  [place], [place] [address]    Report Status 12/02/2019 FINAL  Final  Anaerobic culture     Status: None   Collection Time: 11/29/19 12:02 PM   Specimen: Synovium  Result Value Ref Range Status   Specimen Description   Final    SYNOVIAL Performed at [place] [address], [place], [place] [address]    Special Requests   Final    NONE Performed at [facility], [place] [address] [place], [place] [address]    Culture   Final    NO ANAEROBES ISOLATED Performed at [facility], [place] [address], [place],  [address]    Report Status 12/04/2019 FINAL  Final  SARS CORONAVIRUS 2 (TAT 6-24 HRS) Nasopharyngeal     Status: None   Collection Time: 12/06/19 12:45 AM   Specimen: Nasopharyngeal  Result Value Ref Range Status   SARS Coronavirus 2 NEGATIVE NEGATIVE Final    Comment: (NOTE) SARS-CoV-2 target nucleic acids are NOT DETECTED. The SARS-CoV-2 RNA is generally detectable in upper and lower respiratory specimens during the acute phase of infection. Negative results do not preclude SARS-CoV-2 infection, do not rule out co-infections with other pathogens, and should not be used as the sole basis for treatment or other patient management decisions. Negative results must be combined with clinical observations, patient history, and epidemiological information. The expected result is Negative. Fact Sheet for Patients: SugarRoll.be Fact Sheet for Healthcare Providers: https://www.woods-mathews.com/ This test is not yet approved or cleared by the [place] and  has been authorized  for detection and/or diagnosis of SARS-CoV-2 by FDA under an Emergency Use Authorization (EUA). This EUA will remain  in effect (meaning this test can be used) for the duration of the COVID-19 declaration under Section 56 4(b)(1) of the Act, 21 U.S.C. section 360bbb-3(b)(1), unless the authorization is terminated  or revoked sooner. Performed at [facility], [place] [address], [place],  [address]          Radiology Studies: ECHOCARDIOGRAM COMPLETE  Result Date: 12/06/2019    ECHOCARDIOGRAM REPORT   Patient Name:   Shawn Hendrix Date of Exam: 12/06/2019 Medical Rec #:  [id number]          Height:       60.0 in Accession #:    [id number]         Weight:       117.9 lb Date of Birth:  Aug 02, 1951          BSA:          1.491 m Patient Age:    19 years           BP:           121/78 mmHg Patient Gender: M                  HR:           83 bpm. Exam Location:  Inpatient Procedure: 2D Echo Indications:    abnormal heart sounds  History:        Patient has no prior history of Echocardiogram examinations.                 Risk Factors:Hypertension.  Sonographer:    Jannett Celestine RDCS (AE) Referring Phys: [id number] Makara Lanzo V Stavros Cail  Sonographer Comments: off axis windows IMPRESSIONS  1. Left ventricular ejection fraction, by estimation, is 60 to 65%. The left ventricle has normal function. The left ventricle has no regional wall motion abnormalities. There is moderate asymmetric left ventricular hypertrophy of the basal-septal segment. Left ventricular diastolic parameters are indeterminate.  2. Right ventricular systolic function is normal. The right ventricular size is normal.  3. The mitral valve is normal in structure. Moderate mitral valve regurgitation. No evidence of mitral stenosis.  4. The aortic valve is normal in structure. Aortic valve regurgitation is not visualized. No aortic stenosis is present.  5. Aortic dilatation noted. Aneurysm of the ascending aorta, measuring 38 mm.  6. The inferior vena cava is normal in size with greater than 50% respiratory variability, suggesting right atrial pressure of 3 mmHg. FINDINGS  Left Ventricle: Left ventricular ejection fraction, by estimation, is 60 to 65%. The left ventricle has normal function. The left ventricle has no regional wall motion abnormalities.  The left ventricular internal cavity size was normal in size. There is  moderate asymmetric left ventricular hypertrophy of the basal-septal segment. Left ventricular diastolic parameters are indeterminate. Right Ventricle: The right ventricular size is normal. No increase in right ventricular wall thickness. Right ventricular systolic function is normal. Left Atrium: Left atrial size was normal in size. Right Atrium: Right atrial size was normal in size. Pericardium: There is no evidence of pericardial effusion. Mitral Valve: The mitral valve is normal in structure. 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 There is moderate thickening of the mitral valve leaflet(s). There is mild calcification of the mitral valve leaflet(s). Normal mobility of the mitral valve leaflets. Moderate mitral valve regurgitation, with  posteriorly-directed jet. No evidence of mitral valve stenosis. Tricuspid Valve: The tricuspid valve is normal in structure. Tricuspid valve regurgitation is not demonstrated. No evidence of tricuspid stenosis. Aortic Valve: The aortic valve is normal in structure. Aortic valve regurgitation is not visualized. No aortic stenosis is present. Pulmonic Valve: The pulmonic valve was normal in structure. Pulmonic valve regurgitation is mild. No evidence of pulmonic stenosis. Aorta: Aortic dilatation noted. There is an aneurysm involving the ascending aorta. The aneurysm measures 38 mm. Venous: The inferior vena cava is normal in size with greater than 50% respiratory variability, suggesting right atrial pressure of 3 mmHg. IAS/Shunts: No atrial level shunt detected by color flow Doppler.  LEFT VENTRICLE PLAX 2D LVIDd:         3.70 cm  Diastology LVIDs:         2.60 cm  LV e' lateral:   9.90 cm/s LV PW:         1.30 cm  LV E/e' lateral: 8.4 LV IVS:        1.60 cm  LV e' medial:    6.31 cm/s LVOT diam:     2.10 cm  LV E/e' medial:  13.2 LV SV:          59 LV SV Index:   39 LVOT Area:     3.46 cm  LEFT ATRIUM           Index       RIGHT ATRIUM          Index LA diam:      3.20 cm 2.15 cm/m  RA Area:     8.63 cm LA Vol (A4C): 20.6 ml 13.81 ml/m RA Volume:   13.80 ml 9.25 ml/m  AORTIC VALVE LVOT Vmax:   95.80 cm/s LVOT Vmean:  60.700 cm/s LVOT VTI:    0.169 m  AORTA Ao Root diam: 3.70 cm MITRAL VALVE MV Area (PHT): 3.89 cm    SHUNTS MV Decel Time: 195 msec    Systemic VTI:  0.17 m MR Peak grad: 98.8 mmHg    Systemic Diam: 2.10 cm MR Mean grad: 63.0 mmHg MR Vmax:      497.00 cm/s MR Vmean:     374.0 cm/s MV E velocity: 83.10 cm/s MV A velocity: 76.00 cm/s MV E/A ratio:  1.09 Candee Furbish MD Electronically signed by Candee Furbish MD Signature Date/Time: 12/06/2019/10:43:22 AM    Final         Scheduled Meds: . allopurinol  100 mg Oral Daily  . colchicine  0.6 mg Oral Daily  . cyanocobalamin  1,000 mcg Subcutaneous Daily  . diltiazem  240 mg Oral Daily  . feeding supplement (ENSURE ENLIVE)   Oral TID BM  . ferrous sulfate  325 mg Oral Q breakfast  . folic acid  1 mg Oral Daily  . heparin  5,000 Units Subcutaneous Q8H  . magnesium oxide  400 mg Oral BID  . metroNIDAZOLE  500 mg Oral TID  . multivitamin  1 tablet Oral Daily  . pantoprazole  40 mg Oral Daily  . thiamine  100 mg Oral Daily   Or  . thiamine  100 mg Intravenous Daily   Continuous Infusions: . cefTRIAXone (ROCEPHIN)  IV 2 g (12/06/19 1007)  . magnesium sulfate bolus IVPB 4 g (12/06/19 1028)     LOS: 7 days    Time spent: 35 minutes    Irine Seal, MD [facility]   To contact the attending provider between 7A-7P  or the covering provider during after hours 7P-7A, please log into the web site [email or web address] and access using universal [place] password for that web site. If you do not have the password, please call the hospital operator.  12/06/2019, 11:19 AM

## 2019-12-06 NOTE — TOC Transition Note (Addendum)
Transition of Care [facility]) - CM/SW Discharge Note   Patient Details  Name: BOGAR DOPSON MRN: [id number] Date of Birth: [birth year]  [facility]) CM/SW Contact:  Ross Ludwig, LCSW Phone Number: 12/06/2019, 11:55 AM   Clinical Narrative:     CSW spoke to patient, and he requested that CSW contact his brother [name] at [phone] to discuss bed offers.  CSW left a message awaiting for call back.  12:15pm  CSW received phone call back from [place], [phone] and provided SNF offers, he chose [facility].  CSW spoke to [facility] and they are able to accept patient once he is medically ready for discharge.  2:15pm CSW was informed that SNF's system is saying that patient only has Medicare Part B and Medicaid, however per patient's HCPOA patient has Medicare A and B and Medicaid.  SNF is asking for a copy of the card, CSW spoke to [facility] and he will bring a copy of the card to SNF so they can confirm patient has coverage.  CSW updated physician about insurance issues.  CSW spoke to [place] at [facility], she is going to contact patient's brother to set up a time for the card to be delivered.  3:45pm Patient does not have Medicare Part A only Part B and Medicaid.  [facility] is not able to accept patient anymore.  CSW looking for other bed offers.  Final next level of care: Skilled Nursing Facility Barriers to Discharge: SNF Pending bed offer   Patient Goals and CMS Choice Patient states their goals for this hospitalization and ongoing recovery are:: To go to SNF for short term rehab, then return back home with home health. CMS Medicare.gov Compare Post Acute Care list provided to:: Patient Choice offered to / list presented to : Patient  Discharge Placement                       Discharge Plan and Services     Post Acute Care Choice: [place]            DME Agency: NA       HH Arranged: NA          Social Determinants of Health (SDOH) Interventions     Readmission Risk Interventions No flowsheet data found.

## 2019-12-06 NOTE — Care Management Important Message (Signed)
Important Message  Patient Details IM Letter given to Evette Cristal SW Case Manager to present to the Patient Name: LYFE FITZKE MRN: [id number] Date of Birth: [birth year]   Medicare Important Message Given:  Yes     Kerin Salen 12/06/2019, 10:43 AM

## 2019-12-06 NOTE — Progress Notes (Signed)
  Echocardiogram 2D Echocardiogram has been performed.  Jannett Celestine 12/06/2019, 8:56 AM

## 2019-12-07 DIAGNOSIS — R52 Pain, unspecified: Secondary | ICD-10-CM

## 2019-12-07 DIAGNOSIS — A419 Sepsis, unspecified organism: Secondary | ICD-10-CM | POA: Diagnosis not present

## 2019-12-07 DIAGNOSIS — F1010 Alcohol abuse, uncomplicated: Secondary | ICD-10-CM | POA: Diagnosis not present

## 2019-12-07 DIAGNOSIS — M109 Gout, unspecified: Secondary | ICD-10-CM | POA: Diagnosis not present

## 2019-12-07 DIAGNOSIS — K529 Noninfective gastroenteritis and colitis, unspecified: Secondary | ICD-10-CM | POA: Diagnosis not present

## 2019-12-07 DIAGNOSIS — R531 Weakness: Secondary | ICD-10-CM | POA: Diagnosis not present

## 2019-12-07 DIAGNOSIS — E538 Deficiency of other specified B group vitamins: Secondary | ICD-10-CM | POA: Diagnosis present

## 2019-12-07 LAB — CBC
HCT: 26.9 % — ABNORMAL LOW (ref 39.0–52.0)
Hemoglobin: 8.7 g/dL — ABNORMAL LOW (ref 13.0–17.0)
MCH: 35.7 pg — ABNORMAL HIGH (ref 26.0–34.0)
MCHC: 32.3 g/dL (ref 30.0–36.0)
MCV: 110.2 fL — ABNORMAL HIGH (ref 80.0–100.0)
Platelets: 627 10*3/uL — ABNORMAL HIGH (ref 150–400)
RBC: 2.44 MIL/uL — ABNORMAL LOW (ref 4.22–5.81)
RDW: 15.1 % (ref 11.5–15.5)
WBC: 7 10*3/uL (ref 4.0–10.5)
nRBC: 0.4 % — ABNORMAL HIGH (ref 0.0–0.2)

## 2019-12-07 LAB — BASIC METABOLIC PANEL
Anion gap: 10 (ref 5–15)
BUN: 6 mg/dL — ABNORMAL LOW (ref 8–23)
CO2: 27 mmol/L (ref 22–32)
Calcium: 8.9 mg/dL (ref 8.9–10.3)
Chloride: 94 mmol/L — ABNORMAL LOW (ref 98–111)
Creatinine, Ser: 0.79 mg/dL (ref 0.61–1.24)
GFR calc Af Amer: >60 mL/min (ref >60)
GFR calc non Af Amer: >60 mL/min (ref >60)
Glucose, Bld: 91 mg/dL (ref 70–99)
Potassium: 3.6 mmol/L (ref 3.5–5.1)
Sodium: 131 mmol/L — ABNORMAL LOW (ref 135–145)

## 2019-12-07 LAB — MAGNESIUM: Magnesium: 1.7 mg/dL (ref 1.7–2.4)

## 2019-12-07 LAB — GLUCOSE, CAPILLARY: Glucose-Capillary: 128 mg/dL — ABNORMAL HIGH (ref 70–99)

## 2019-12-07 MED ORDER — MAGNESIUM OXIDE 400 (241.3 MG) MG PO TABS: 400.0000 mg | ORAL_TABLET | Freq: Two times a day (BID) | ORAL | 1 refills | Status: DC

## 2019-12-07 MED ORDER — POTASSIUM CHLORIDE CRYS ER 20 MEQ PO TBCR
40.0000 meq | EXTENDED_RELEASE_TABLET | Freq: Once | ORAL | Status: AC
  Administered 2019-12-07: 40 meq via ORAL
  Filled 2019-12-07: qty 2

## 2019-12-07 MED ORDER — COLCHICINE 0.6 MG PO TABS: 0.6000 mg | ORAL_TABLET | Freq: Every day | ORAL | 0 refills | Status: AC

## 2019-12-07 MED ORDER — FUROSEMIDE 10 MG/ML IJ SOLN
20.0000 mg | Freq: Once | INTRAMUSCULAR | Status: AC
  Administered 2019-12-07: 20 mg via INTRAVENOUS
  Filled 2019-12-07: qty 2

## 2019-12-07 MED ORDER — TRAMADOL HCL 50 MG PO TABS: 50.0000 mg | ORAL_TABLET | Freq: Four times a day (QID) | ORAL | 0 refills | Status: DC | PRN

## 2019-12-07 MED ORDER — MAGNESIUM SULFATE 4 GM/100ML IV SOLN
4.0000 g | Freq: Once | INTRAVENOUS | Status: AC
  Administered 2019-12-07: 4 g via INTRAVENOUS
  Filled 2019-12-07: qty 100

## 2019-12-07 MED ORDER — VITAMIN B-12 1000 MCG PO TABS: 1000.0000 ug | ORAL_TABLET | Freq: Every day | ORAL | Status: AC

## 2019-12-07 MED ORDER — OMEPRAZOLE 40 MG PO CPDR: 40.0000 mg | DELAYED_RELEASE_CAPSULE | Freq: Every day | ORAL | Status: DC

## 2019-12-07 MED ORDER — ALLOPURINOL 100 MG PO TABS: 100.0000 mg | ORAL_TABLET | Freq: Every day | ORAL | 0 refills | Status: AC

## 2019-12-07 NOTE — Discharge Summary (Signed)
Physician Discharge Summary  Shawn Hendrix [id number] DOB: [birth year] DOA: 11/28/2019  PCP: Javier Docker, MD  Admit date: 11/28/2019 Discharge date: 12/07/2019  Time spent: 55 minutes  Recommendations for Outpatient Follow-up:  1. Follow-up with MD at skilled nursing facility.  Patient will need a basic metabolic profile and magnesium level checked in 1 week to follow-up on electrolytes and renal function.   Discharge Diagnoses:  Principal Problem:   Sepsis ([place]) Active Problems:   Colitis   Generalized weakness   Acute gouty arthropathy   Acute left-sided weakness   Folate deficiency   Alcohol abuse   Hypomagnesemia   Essential hypertension   Pain   Vitamin B12 deficiency   Discharge Condition: Stable and improved  Diet recommendation: Heart healthy  Filed Weights   12/04/19 0500 12/05/19 0647 12/06/19 0500  Weight: 55.3 kg 57.4 kg 53.5 kg    History of present illness:  HPI per Dr. Hubert Azure is a 69 y.o. male with medical history significant for but not limited too anemia, arthralgia, arthritis, gout, hypertension, history of prostate cancer, history of generalized weakness and malnutrition of moderate degree, history of alcohol withdrawal seizures as well as other comorbidities who presented with chief complaint of generalized weakness and deconditioning associated with pain in his left knee and hand.  Patient stated that he started getting weaker about a week ago and states that he fell twice but did not actually hit the floor and had been caught by furniture.  Because he has been weak he has been sitting in a chair mostly in unable to get up and care for himself and has not been eating very well.  He typically drinks daily alcohol drinks a full bottle of wine daily but because of his weakness he stopped the drinking.  Stated that his girlfriend helps.  Patient stated that his hand started swelling and started having some pain as well a week ago  and denies any nausea or vomiting.  Denied any chest pain at this time but states that he does not have the strength or "power anymore."  Denies any other concerns or complaints at this time and [facility] was asked to admit this patient for generalized weakness as well as pain in the knee and hand associated with poor p.o. intake  ED Course: In the ED he had basic blood work done as well as a CT of the head without contrast.  Given 500 mL bolus now also had an MRI ordered.  SARS-CoV-2 testing is still pending.  Urinalysis and urine culture were unremarkable but did show small hemoglobin in the urinalysis.  Hospital Course:  1 sepsis likely secondary to colitis Patient had presented with leukocytosis, fever with a temp of 101, tachypnea, tachycardia meeting criteria for sepsis.  CT abdomen and pelvis done concerning for mild wall thickness submucosal edema involving the ascending colon suggesting inflammatory versus infectious colitis.  Urinalysis with nitrite negative, leukocytes negative.  Urine cultures with insignificant growth.  Blood cultures with no growth to date.  Cultures from arthrocentesis negative.  Patient initially placed on IV vancomycin and IV cefepime and IV Flagyl.  IV vancomycin subsequently  discontinued and IV cefepime narrowed to IV Rocephin.  IV Flagyl was changed to oral Flagyl which patient tolerated.  Patient completed a full course of antibiotic treatment during the hospitalization.  Patient initially started on a clear liquid diet and diet advanced to a soft diet which he tolerated.  Patient will be discharged in stable and  improved condition.    2.  Acute gouty arthritis of the left knee/left knee joint effusion Chronic alcohol use likely playing a role.  Alcohol cessation stressed to patient.  Patient seen in consultation by orthopedics.  Patient underwent arthrocentesis 11/29/2019 with synovial fluid showing 21,000 white blood cells, monosodium urate crystals.  Cultures negative.   Patient was placed on colchicine 0.6 mg twice daily as well as started on allopurinol.  Patient received a 7-day course of treatment and colchicine subsequently decreased to 0.6 mg daily.  Patient improved clinically.  Alcohol cessation stressed to patient and he seemed motivated.  Outpatient follow-up.  3.  Meniscus tear per MRI Per orthopedics.  4.  Generalized weakness/left-sided weakness/failure to thrive/vitamin B12 deficiency/folate deficiency MRI negative for any acute abnormalities.  Left hand and left knee pain improved daily with treatment of acute gouty arthritis.  Patient maintained on folic acid 1 mg daily.  Patient also placed on vitamin B12 subcutaneous injections daily during the hospitalization and be discharged on oral vitamin B12 supplementation.  Patient was seen by PT OT who recommended SNF placement.  Patient will be discharged to SNF.  5.  Alcohol abuse Alcohol cessation stressed to patient.    Patient maintained on the Ativan withdrawal protocol, folic acid, multivitamin, thiamine.  Patient did not have any significant withdrawal symptoms during the hospitalization.  Outpatient follow-up.   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  Erosive changes to the ulnar styloid suggesting an underlying inflammatory arthropathy such as rheumatoid arthritis.  Patient was treated for acute gouty arthritis.  Improved clinically.  Outpatient follow-up.   7.  Hypomagnesemia Magnesium repleted during the hospitalization.  Patient placed on oral magnesium supplementation.  Will need outpatient follow-up.   8.  Hypertension Patient maintained on home regimen of Cardizem.   9.  Hyperbilirubinemia Trended down.  10.  Hypokalemia Repleted.   11.  Murmur 2D echo with EF of 60 to 65%, no wall motion abnormalities, moderate asymmetric left ventricular hypertrophy of basal/septal segment, left  ventricular diastolic parameters indeterminate.  Moderate mitral valve regurgitation.  No mitral stenosis.  No aortic stenosis present.  Aortic dilatation noted.  Aneurysm of ascending aorta measuring 38 mm.  Outpatient follow-up.    Procedures:  CT abdomen and pelvis 11/29/2019  CT head 11/28/2019  MRI brain 11/28/2019  MRI left knee 11/29/2019  Left knee aspiration/arthrocentesis 11/29/2019   Consultations:  Orthopedics: Dr. Lorin Mercy 11/29/2019  Discharge Exam: Vitals:   12/07/19 0548 12/07/19 1448  BP: 114/70 120/73  Pulse: 87 79  Resp: 20   Temp: 99.3 F (37.4 C) 98 F (36.7 C)  SpO2: 96% 100%    General: NAD Cardiovascular: RRR Respiratory: CTAB  Discharge Instructions   Discharge Instructions    Diet - low sodium heart healthy   Complete by: As directed    Increase activity slowly   Complete by: As directed      Allergies as of 12/07/2019   No Known Allergies     Medication List    TAKE these medications   acetaminophen 325 MG tablet Commonly known as: TYLENOL Take 2 tablets (650 mg total) by mouth every 6 (six) hours as needed for mild pain (or Fever >/= 101).   allopurinol 100 MG tablet Commonly known as: ZYLOPRIM Take 1 tablet (100 mg total) by mouth daily. Start taking on: December 08, 2019   colchicine 0.6 MG tablet Take 1 tablet (  0.6 mg total) by mouth daily. What changed: when to take this   diltiazem 240 MG 24 hr capsule Commonly known as: CARDIZEM CD Take 1 capsule (240 mg total) by mouth daily.   feeding supplement Liqd Take 1 Container by mouth 3 (three) times daily between meals.   ferrous sulfate 325 (65 FE) MG tablet Take 1 tablet (325 mg total) by mouth daily with breakfast.   folic acid 1 MG tablet Commonly known as: FOLVITE Take 1 tablet (1 mg total) by mouth daily.   gabapentin 300 MG capsule Commonly known as: NEURONTIN Take 300 mg by mouth 2 (two) times daily.   lisinopril 20 MG tablet Commonly known as: ZESTRIL Take  1 tablet (20 mg total) by mouth daily.   magnesium oxide 400 (241.3 Mg) MG tablet Commonly known as: MAG-OX Take 1 tablet (400 mg total) by mouth 2 (two) times daily.   multivitamin Tabs tablet Take 1 tablet by mouth daily.   omeprazole 40 MG capsule Commonly known as: PRILOSEC Take 1 capsule (40 mg total) by mouth daily. What changed:   medication strength  how much to take   thiamine 100 MG tablet Take 1 tablet (100 mg total) by mouth daily.   traMADol 50 MG tablet Commonly known as: ULTRAM Take 1 tablet (50 mg total) by mouth every 6 (six) hours as needed.   vitamin B-12 1000 MCG tablet Commonly known as: CYANOCOBALAMIN Take 1 tablet (1,000 mcg total) by mouth daily.            Durable Medical Equipment  (From admission, onward)         Start     Ordered   12/07/19 0959  For home use only DME 4 wheeled rolling walker with seat  Once    Question:  Patient needs a walker to treat with the following condition  Answer:  Debility   12/07/19 0959   12/04/19 1241  For home use only DME Cane  Once     12/04/19 1240         No Known Allergies Follow-up Information    MD AT SNF Follow up.            The results of significant diagnostics from this hospitalization (including imaging, microbiology, ancillary and laboratory) are listed below for reference.    Significant Diagnostic Studies: CT Head Wo Contrast  Result Date: 11/28/2019 CLINICAL DATA:  Stroke suspected. Bilateral hands and feet swelling over the past week. EXAM: CT HEAD WITHOUT CONTRAST TECHNIQUE: Contiguous axial images were obtained from the base of the skull through the vertex without intravenous contrast. COMPARISON:  12/02/2016 FINDINGS: Brain: Ventricles, cisterns and other CSF spaces are within normal. There is mild chronic ischemic microvascular disease. There is no mass, mass effect, shift of midline structures or acute hemorrhage. No evidence of acute infarction. Vascular: No hyperdense  vessel or unexpected calcification. Skull: Normal. Negative for fracture or focal lesion. Sinuses/Orbits: Prosthetic right globe. Left orbit is unremarkable. Paranasal sinuses are clear. Other: None. IMPRESSION: No acute findings. Chronic ischemic microvascular disease. Electronically Signed   By: Marin Olp M.D.   On: 11/28/2019 13:52   MR BRAIN WO CONTRAST  Result Date: 11/28/2019 CLINICAL DATA:  69 year old male with possible stroke. Extremity swelling. Alcohol withdrawal seizures. EXAM: MRI HEAD WITHOUT CONTRAST TECHNIQUE: Multiplanar, multiecho pulse sequences of the brain and surrounding structures were obtained without intravenous contrast. COMPARISON:  Head CT earlier today. FINDINGS: Brain: No restricted diffusion to suggest acute infarction. No midline  shift, mass effect, evidence of mass lesion, ventriculomegaly, extra-axial collection or acute intracranial hemorrhage. Cervicomedullary junction and pituitary are within normal limits. Cerebral volume loss which seems fairly generalized. The parietal lobes may be relatively spared. Superimposed mild for age bilateral cerebral white matter T2 and FLAIR hyperintensity. No cortical encephalomalacia or chronic cerebral blood products identified. Signal in the deep gray nuclei, brainstem and cerebellum within normal limits. Vascular: Major intracranial vascular flow voids are preserved. Skull and upper cervical spine: Advanced cervical spine degeneration on series 5, image 13 with widespread mild degenerative cervical spinal stenosis suspected. Degenerative appearing sclerosis of the cervical vertebrae. Skull bone marrow signal appears normal. Sinuses/Orbits: Right orbital prosthesis. Otherwise negative orbits soft tissues. Paranasal sinuses and mastoids are stable and well pneumatized. Other: Visible internal auditory structures appear normal. There is a tiny forehead scalp lipoma in the midline on series 5, image 12. IMPRESSION: 1. No acute intracranial  abnormality. 2. Brain volume loss seems fairly generalized with no definite areas of disproportionate involvement. Mild for age nonspecific white matter signal changes, most commonly due to small vessel disease. 3. 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 Electronically Signed   By: Genevie Ann M.D.   On: 11/28/2019 16:33   CT ABDOMEN PELVIS W CONTRAST  Result Date: 11/29/2019 CLINICAL DATA:  Generalized weakness and vomiting. EXAM: CT ABDOMEN AND PELVIS WITH CONTRAST TECHNIQUE: Multidetector CT imaging of the abdomen and pelvis was performed using the standard protocol following bolus administration of intravenous contrast. CONTRAST:  179mL OMNIPAQUE IOHEXOL 300 MG/ML  SOLN COMPARISON:  01/16/2018 FINDINGS: Lower chest: The heart is within normal limits in size. No pericardial effusion. Coronary artery and aortic calcifications are noted. Moderate breathing motion artifact. Streaky bibasilar atelectasis but no infiltrates or pulmonary lesions. Hepatobiliary: No focal hepatic lesions or intrahepatic biliary dilatation. Diffusely thick walled gallbladder could be due to lack of distension or low albumin. The portal and hepatic veins are patent. No common bile duct dilatation. Pancreas: No mass, inflammation or ductal dilatation. Spleen: Normal size. No focal lesions. Adrenals/Urinary Tract: The adrenal glands are unremarkable. No renal, ureteral or bladder calculi or mass. Mild perinephric interstitial changes appears stable and likely due to prior inflammation. Stomach/Bowel: The stomach, duodenum, small bowel and colon are grossly normal. Mild wall thickening and submucosal edema involving the ascending colon could suggest an inflammatory or infectious colitis. The terminal ileum and appendix appear normal. Scattered colonic diverticulosis without findings for acute diverticulitis. Moderate stool noted in the sigmoid colon and rectum. Vascular/Lymphatic: Stable tortuosity  and moderate atherosclerotic calcifications involving the abdominal aorta and iliac arteries but no aneurysm or dissection. The branch vessels are patent. The major venous structures are patent. No mesenteric or retroperitoneal mass or adenopathy. Reproductive: Brachytherapy seeds are noted in the prostate gland. The seminal vesicles appear normal. Other: No pelvic mass or pelvic adenopathy. No free pelvic fluid collections. No inguinal mass or hernia. Musculoskeletal: No significant bony findings. IMPRESSION: 1. Mild wall thickening and submucosal edema involving the ascending colon could suggest an inflammatory or infectious colitis. 2. No other significant abdominal/pelvic findings, mass lesions or adenopathy. 3. Gallbladder wall thickening could be due to lack of distension/contraction. 4. Stable atherosclerotic calcifications involving the aorta and iliac arteries. Aortic Atherosclerosis (ICD10-I70.0). Electronically Signed   By: Marijo Sanes M.D.   On: 11/29/2019 11:38   DG Hand 2 View Left  Result Date: 11/28/2019 CLINICAL DATA:  Left hand weakness for 1 week EXAM: LEFT HAND - 2 VIEW COMPARISON:  None. FINDINGS: Two views of the left hand were obtained. Severe degenerative changes of the radiocarpal joint with extensive osseous remodeling. Findings of SLAC wrist including widening of the scapholunate interval with proximal migration of the capitate and associated osseous remodeling. Erosive changes to the distal ulna suggesting an underlying inflammatory arthropathy such as rheumatoid arthritis. Soft tissue swelling overlies the ulnar styloid. Relative preservation of the joint spaces of the MCP joints and interphalangeal joints of the hand. No acute fractures. IMPRESSION: 1. 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 2. Erosive changes to the ulnar styloid suggesting an underlying inflammatory arthropathy such as rheumatoid arthritis. Electronically Signed   By: Davina Poke D.O.   On: 11/28/2019 17:40   MR KNEE LEFT WO CONTRAST  Result Date: 11/29/2019 CLINICAL DATA:  Painful swollen left knee. Possible septic arthritis. EXAM: MRI OF THE LEFT KNEE WITHOUT CONTRAST TECHNIQUE: Multiplanar, multisequence MR imaging of the knee was performed. No intravenous contrast was administered. COMPARISON:  None. FINDINGS: Examination is quite limited due to patient motion and lack of IV contrast. MENISCI Medial meniscus: Complex tear involving the posterior horn and mid body region of the medial meniscus. Lateral meniscus:  Grossly intact. LIGAMENTS Cruciates:  Intact Collaterals:  Intact. MCL and pes anserine bursitis. CARTILAGE Patellofemoral:  Moderate degenerative chondrosis. Medial: Areas of full or near full-thickness cartilage loss, joint space narrowing and subchondral cystic change and edema. Lateral:  Moderate degenerative chondrosis. Joint:  Large joint effusion. Popliteal Fossa:  No popliteal mass or Baker's cyst. Extensor Mechanism: The patella retinacular structures are intact and the quadriceps and patellar tendons are intact. There is severe chronic appearing patellar tendinopathy with a markedly thickened tendon with intrasubstance interstitial tears and a fairly long segment longitudinal split type tear. Bones: Subchondral marrow edema mainly involving the medial tibial plateau likely due to the degenerative changes. Other: Grossly normal knee musculature. IMPRESSION: 1. Complex tear involving the posterior horn and mid body region of the medial meniscus. 2. Intact ligamentous structures and no acute bony findings. 3. Tricompartmental degenerative changes most significant in the medial compartment. 4. Large joint effusion. No obvious findings for osteomyelitis but if septic arthritis is a consideration joint aspiration should be considered. 5. Severe chronic appearing patellar tendinopathy with significant thickening and longitudinal split type tear. Electronically  Signed   By: Marijo Sanes M.D.   On: 11/29/2019 12:15   DG Chest Portable 1 View  Result Date: 11/28/2019 CLINICAL DATA:  Repeat chest x-ray with nipple markers. EXAM: PORTABLE CHEST 1 VIEW COMPARISON:  November 28, 2019 FINDINGS: The nodule projected over the right lower lobe on the previous study is a nipple shadow, marked with a nipple marker on this study. No pneumothorax. The lungs are otherwise clear. The heart, hila, mediastinum are unremarkable. No other acute abnormalities are identified. IMPRESSION: No active disease. Electronically Signed   By: Dorise Bullion III M.D   On: 11/28/2019 14:21   DG Chest Portable 1 View  Result Date: 11/28/2019 CLINICAL DATA:  Weakness EXAM: PORTABLE CHEST 1 VIEW COMPARISON:  12/01/2017 FINDINGS: The heart size and mediastinal contours are stable. Faint rounded 8 mm density projects over the peripheral aspect of the lower right hemithorax, favored to represent a nipple shadow. No focal airspace consolidation, pleural effusion, or pneumothorax. Advanced degenerative changes of the shoulders, right worse than left. IMPRESSION: 1. No active cardiopulmonary disease. 2. Faint 8 mm rounded density projects over the peripheral aspect of the lower right hemithorax, favored to represent  a nipple shadow. Repeat chest radiograph with nipple markers could be performed to confirm. Electronically Signed   By: Davina Poke D.O.   On: 11/28/2019 13:32   DG Knee Complete 4 Views Left  Result Date: 11/28/2019 CLINICAL DATA:  Left knee pain EXAM: LEFT KNEE - COMPLETE 4+ VIEW COMPARISON:  None. FINDINGS: No evidence of acute fracture. There is a old healed fracture of the proximal fibular metaphysis with bony callus formation. Osseous irregularity of the inferior pole of the patella with fragmentation, age indeterminate. Mild tricompartmental joint space narrowing. Small to moderate knee joint effusion. Soft tissue thickening in the prepatellar region and in the region of the  patellar tendon. Severe vascular calcifications. IMPRESSION: 1. Osseous irregularity of the inferior pole of the patella with fragmentation. Findings are indeterminate and could reflect chronic posttraumatic or degenerative changes. Erosive/resorptive changes in the setting of osteomyelitis would have a similar appearance. If there is clinical concern for infection, arthrocentesis is recommended. 2. Small to moderate knee joint effusion, nonspecific. 3. Mild tricompartmental degenerative changes. 4. Chronic healed proximal fibular fracture. 5. Severe vascular calcifications. Electronically Signed   By: Davina Poke D.O.   On: 11/28/2019 13:39   ECHOCARDIOGRAM COMPLETE  Result Date: 12/06/2019    ECHOCARDIOGRAM REPORT   Patient Name:   BUFFORD SUTHERS Date of Exam: 12/06/2019 Medical Rec #:  [id number]          Height:       60.0 in Accession #:    [id number]         Weight:       117.9 lb Date of Birth:  19-Oct-1950          BSA:          1.491 m Patient Age:    21 years           BP:           121/78 mmHg Patient Gender: M                  HR:           83 bpm. Exam Location:  Inpatient Procedure: 2D Echo Indications:    abnormal heart sounds  History:        Patient has no prior history of Echocardiogram examinations.                 Risk Factors:Hypertension.  Sonographer:    Jannett Celestine RDCS (AE) Referring Phys: [id number] [place] Shakeda Pearse  Sonographer Comments: off axis windows IMPRESSIONS  1. Left ventricular ejection fraction, by estimation, is 60 to 65%. The left ventricle has normal function. The left ventricle has no regional wall motion abnormalities. There is moderate asymmetric left ventricular hypertrophy of the basal-septal segment. Left ventricular diastolic parameters are indeterminate.  2. Right ventricular systolic function is normal. The right ventricular size is normal.  3. The mitral valve is normal in structure. Moderate mitral valve regurgitation. No evidence of mitral stenosis.  4. The  aortic valve is normal in structure. Aortic valve regurgitation is not visualized. No aortic stenosis is present.  5. Aortic dilatation noted. Aneurysm of the ascending aorta, measuring 38 mm.  6. The inferior vena cava is normal in size with greater than 50% respiratory variability, suggesting right atrial pressure of 3 mmHg. FINDINGS  Left Ventricle: Left ventricular ejection fraction, by estimation, is 60 to 65%. The left ventricle has normal function. The left ventricle has no regional wall motion abnormalities. The left ventricular  internal cavity size was normal in size. There is  moderate asymmetric left ventricular hypertrophy of the basal-septal segment. Left ventricular diastolic parameters are indeterminate. Right Ventricle: The right ventricular size is normal. No increase in right ventricular wall thickness. Right ventricular systolic function is normal. Left Atrium: Left atrial size was normal in size. Right Atrium: Right atrial size was normal in size. Pericardium: There is no evidence of pericardial effusion. Mitral Valve: The mitral valve is normal in structure. 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 There is moderate thickening of the mitral valve leaflet(s). There is mild calcification of the mitral valve leaflet(s). Normal mobility of the mitral valve leaflets. Moderate mitral valve regurgitation, with posteriorly-directed jet. No evidence of mitral valve stenosis. Tricuspid Valve: The tricuspid valve is normal in structure. Tricuspid valve regurgitation is not demonstrated. No evidence of tricuspid stenosis. Aortic Valve: The aortic valve is normal in structure. Aortic valve regurgitation is not visualized. No aortic stenosis is present. Pulmonic Valve: The pulmonic valve was normal in structure. Pulmonic valve regurgitation is mild. No evidence of pulmonic stenosis. Aorta: Aortic dilatation noted. There is an aneurysm involving  the ascending aorta. The aneurysm measures 38 mm. Venous: The inferior vena cava is normal in size with greater than 50% respiratory variability, suggesting right atrial pressure of 3 mmHg. IAS/Shunts: No atrial level shunt detected by color flow Doppler.  LEFT VENTRICLE PLAX 2D LVIDd:         3.70 cm  Diastology LVIDs:         2.60 cm  LV e' lateral:   9.90 cm/s LV PW:         1.30 cm  LV E/e' lateral: 8.4 LV IVS:        1.60 cm  LV e' medial:    6.31 cm/s LVOT diam:     2.10 cm  LV E/e' medial:  13.2 LV SV:         59 LV SV Index:   39 LVOT Area:     3.46 cm  LEFT ATRIUM           Index       RIGHT ATRIUM          Index LA diam:      3.20 cm 2.15 cm/m  RA Area:     8.63 cm LA Vol (A4C): 20.6 ml 13.81 ml/m RA Volume:   13.80 ml 9.25 ml/m  AORTIC VALVE LVOT Vmax:   95.80 cm/s LVOT Vmean:  60.700 cm/s LVOT VTI:    0.169 m  AORTA Ao Root diam: 3.70 cm MITRAL VALVE MV Area (PHT): 3.89 cm    SHUNTS MV Decel Time: 195 msec    Systemic VTI:  0.17 m MR Peak grad: 98.8 mmHg    Systemic Diam: 2.10 cm MR Mean grad: 63.0 mmHg MR Vmax:      497.00 cm/s MR Vmean:     374.0 cm/s MV E velocity: 83.10 cm/s MV A velocity: 76.00 cm/s MV E/A ratio:  1.09 Candee Furbish MD Electronically signed by Candee Furbish MD Signature Date/Time: 12/06/2019/10:43:22 AM    Final     Microbiology: Recent Results (from the past 240 hour(s))  Urine culture     Status: Abnormal   Collection Time: 11/28/19 11:29 AM   Specimen: Urine, Random  Result Value Ref Range Status   Specimen Description   Final    URINE, RANDOM Performed at [facility], [address] [place], [place] [address]    Special Requests   Final    NONE Performed at [facility], [place] [address] [place], [place] [address]    Culture (A)  Final    <10,000 COLONIES/mL INSIGNIFICANT GROWTH Performed at [facility] [address], [place], [place] [address]    Report Status 11/29/2019 FINAL  Final  SARS CORONAVIRUS 2 (TAT  6-24 HRS) Nasopharyngeal Nasopharyngeal Swab     Status: None   Collection Time: 11/28/19 11:55 AM   Specimen: Nasopharyngeal Swab  Result Value Ref Range Status   SARS Coronavirus 2 NEGATIVE NEGATIVE Final    Comment: (NOTE) SARS-CoV-2 target nucleic acids are NOT DETECTED. The SARS-CoV-2 RNA is generally detectable in upper and lower respiratory specimens during the acute phase of infection. Negative results do not preclude SARS-CoV-2 infection, do not rule out co-infections with other pathogens, and should not be used as the sole basis for treatment or other patient management decisions. Negative results must be combined with clinical observations, patient history, and epidemiological information. The expected result is Negative. Fact Sheet for Patients: SugarRoll.be Fact Sheet for Healthcare Providers: https://www.woods-mathews.com/ This test is not yet approved or cleared by the [place] FDA and  has been authorized for detection and/or diagnosis of SARS-CoV-2 by FDA under an Emergency Use Authorization (EUA). This EUA will remain  in effect (meaning this test can be used) for the duration of the COVID-19 declaration under Section 56 4(b)(1) of the Act, 21 U.S.C. section 360bbb-3(b)(1), unless the authorization is terminated or revoked sooner. Performed at [facility], [place] [address], [place], [place] [address]   Culture, blood (routine x 2)     Status: None   Collection Time: 11/28/19  3:26 PM   Specimen: BLOOD  Result Value Ref Range Status   Specimen Description   Final    BLOOD RIGHT HAND Performed at [facility] [address], [place], [place] [address]    Special Requests   Final    BOTTLES DRAWN AEROBIC ONLY Blood Culture results may not be optimal due to an inadequate volume of blood received in culture bottles Performed at [facility] [address], [place], [place]  [address]    Culture   Final    NO GROWTH 5 DAYS Performed at [facility], [place] [address] [place], [place] [address]    Report Status 12/03/2019 FINAL  Final  Culture, blood (routine x 2)     Status: None   Collection Time: 11/29/19  5:20 AM   Specimen: BLOOD  Result Value Ref Range Status   Specimen Description   Final    BLOOD LEFT ARM Performed at [facility] [address] [place], [place] [address]    Special Requests   Final    BOTTLES DRAWN AEROBIC AND ANAEROBIC Blood Culture adequate volume Performed at [facility] [address], [place], [place] [address]    Culture   Final    NO GROWTH 5 DAYS Performed at [facility], [place] [address] [place],  [address]    Report Status 12/04/2019 FINAL  Final  Body fluid culture     Status: None   Collection Time: 11/29/19 12:02 PM   Specimen: Synovium  Result Value Ref Range Status   Specimen Description   Final    SYNOVIAL Performed at [facility] [address] [place],  [address]  Special Requests LOOK FOR GC  Final   Gram Stain   Final    MODERATE WBC PRESENT, PREDOMINANTLY PMN NO ORGANISMS SEEN    Culture   Final    NO GROWTH 3 DAYS Performed at [facility], [address] [place], [place] [address]    Report Status 12/02/2019 FINAL  Final  Anaerobic culture     Status: None   Collection Time: 11/29/19 12:02 PM   Specimen: Synovium  Result Value Ref Range Status   Specimen Description   Final    SYNOVIAL Performed at [facility] [address] [place], [place] [address]    Special Requests   Final    NONE Performed at [facility], [place] [address], [place], [place] [address]    Culture   Final    NO ANAEROBES ISOLATED Performed at [facility], [place] [address], [place], [place] [address]    Report Status 12/04/2019 FINAL  Final  SARS CORONAVIRUS 2 (TAT 6-24 HRS)  Nasopharyngeal     Status: None   Collection Time: 12/06/19 12:45 AM   Specimen: Nasopharyngeal  Result Value Ref Range Status   SARS Coronavirus 2 NEGATIVE NEGATIVE Final    Comment: (NOTE) SARS-CoV-2 target nucleic acids are NOT DETECTED. The SARS-CoV-2 RNA is generally detectable in upper and lower respiratory specimens during the acute phase of infection. Negative results do not preclude SARS-CoV-2 infection, do not rule out co-infections with other pathogens, and should not be used as the sole basis for treatment or other patient management decisions. Negative results must be combined with clinical observations, patient history, and epidemiological information. The expected result is Negative. Fact Sheet for Patients: SugarRoll.be Fact Sheet for Healthcare Providers: https://www.woods-mathews.com/ This test is not yet approved or cleared by the [place] FDA and  has been authorized for detection and/or diagnosis of SARS-CoV-2 by FDA under an Emergency Use Authorization (EUA). This EUA will remain  in effect (meaning this test can be used) for the duration of the COVID-19 declaration under Section 56 4(b)(1) of the Act, 21 U.S.C. section 360bbb-3(b)(1), unless the authorization is terminated or revoked sooner. Performed at [facility], [place] [address], [place],  [address]      Labs: Basic Metabolic Panel: Recent Labs  Lab 12/03/19 0502 12/04/19 0610 12/05/19 0553 12/06/19 0626 12/07/19 0533  NA 134* 134* 132* 131* 131*  K 3.1* 3.5 3.9 3.8 3.6  CL 103 101 98 95* 94*  CO2 22 24 27 27 27   GLUCOSE 87 82 97 87 91  BUN 10 7* 6* 6* 6*  CREATININE 0.69 0.66 0.69 0.79 0.79  CALCIUM 8.4* 8.4* 8.4* 8.7* 8.9  MG 1.3* 1.6* 1.8 1.6* 1.7   Liver Function Tests: Recent Labs  Lab 12/02/19 0537  AST 40  ALT 17  ALKPHOS 53  BILITOT 1.0  PROT 6.2*  ALBUMIN 2.3*   No results for input(s): LIPASE, AMYLASE in the last  168 hours. No results for input(s): AMMONIA in the last 168 hours. CBC: Recent Labs  Lab 12/01/19 0618 12/01/19 0618 12/02/19 0537 12/02/19 0537 12/03/19 0502 12/04/19 0610 12/05/19 0553 12/06/19 0626 12/07/19 0533  WBC 12.1*   < > 13.4*   < > 11.7* 9.5 8.1 8.0 7.0  NEUTROABS 9.9*  --  10.8*  --   --  6.7  --   --   --   HGB 8.0*   < > 8.5*   < > 7.9*  8.6* 8.0* 8.3* 8.7*  HCT 23.7*   < > 26.6*   < > 24.0* 23.7* 23.3* 25.6* 26.9*  MCV 115.6*   < > 114.7*   < > 111.6* 113.4* 111.5* 109.9* 110.2*  PLT 430*   < > 522*   < > 522* 621* 585* 583* 627*   < > = values in this interval not displayed.   Cardiac Enzymes: No results for input(s): CKTOTAL, CKMB, CKMBINDEX, TROPONINI in the last 168 hours. BNP: BNP (last 3 results) No results for input(s): BNP in the last 8760 hours.  ProBNP (last 3 results) No results for input(s): PROBNP in the last 8760 hours.  CBG: Recent Labs  Lab 12/03/19 0801 12/04/19 0813 12/05/19 0749 12/06/19 0848 12/07/19 0755  GLUCAP 68* 79 79 100* 128*       Signed:  Irine Seal MD.  [facility] 12/07/2019, 3:34 PM

## 2019-12-07 NOTE — Progress Notes (Signed)
Physical Therapy Treatment Patient Details Name: Shawn Hendrix MRN: [id number] DOB: [birth year] Today's Date: 12/07/2019    History of Present Illness 69 yo male admitted with weakness, falls. L knee pain, FTT. Hx of gout, OA, seizures, ETOH use, prostate ca. MRI (+) L knee complex meniscus tear    PT Comments    Patient continues to progress steadily with acute therapy services. He remains a VERY HIGH FALL RISK and is unsteady with transfers requiring min guard assist for safety. Patient required min assist multiple times during gait due to multiple LOB's. Recommendation remains SNF level therapy for follow up with 24/7 assist. If pt is unable to discharge to a SNF he will require 24/7 supervision/assist with all mobility and [place] services. Acute PT will continue to follow and progress as able.   Follow Up Recommendations  SNF;Supervision/Assistance - 24 hour(pt would require 24/7 assist at home and HHPT services)     Equipment Recommendations  (pt reports he has RW at home)    Recommendations for Other Services       Precautions / Restrictions Precautions Precautions: Fall Precaution Comments: high fall risk Restrictions Weight Bearing Restrictions: No    Mobility  Bed Mobility Overal bed mobility: Needs Assistance Bed Mobility: Supine to Sit     Supine to sit: Supervision;HOB elevated     General bed mobility comments: Increased time and use of bed rails, no physical assist needed  Transfers Overall transfer level: Needs assistance Equipment used: Rolling walker (2 wheeled) Transfers: Sit to/from Stand Sit to Stand: Min guard         General transfer comment: cues for technique with RW for power up, no assist but min guard required for safety and pt very unsteady with rising.  Ambulation/Gait Ambulation/Gait assistance: Min assist Gait Distance (Feet): 160 Feet Assistive device: Rolling walker (2 wheeled) Gait Pattern/deviations: Step-through  pattern;Decreased step length - right;Decreased step length - left;Shuffle;Trunk flexed;Narrow base of support;Decreased stride length Gait velocity: decreased Gait velocity interpretation: <1.8 ft/sec, indicate of risk for recurrent falls General Gait Details: pt requires cues for posture and for safe proximity to RW, assist needed to manage walker position. Pt with multiple episodes of LOB requiring min assist to steady.    Stairs             Wheelchair Mobility    Modified Rankin (Stroke Patients Only)       Balance Overall balance assessment: Needs assistance;History of Falls Sitting-balance support: Feet supported Sitting balance-Leahy Scale: Good     Standing balance support: During functional activity;Bilateral upper extremity supported Standing balance-Leahy Scale: Poor Standing balance comment: very reliant on RW to maintain balance         Cognition Arousal/Alertness: Awake/alert Behavior During Therapy: WFL for tasks assessed/performed Overall Cognitive Status: No family/caregiver present to determine baseline cognitive functioning      General Comments: mumbles speech, follows directions appropriately      Exercises      General Comments        Pertinent Vitals/Pain Pain Assessment: No/denies pain           PT Goals (current goals can now be found in the care plan section) Acute Rehab PT Goals Patient Stated Goal: to get strength back PT Goal Formulation: With patient Time For Goal Achievement: 12/13/19 Potential to Achieve Goals: Fair    Frequency    Min 3X/week(increased as pt may return home)      PT Plan Current plan remains appropriate;Frequency needs to be  updated(pt may be declined from SNF, increase frequency)    Co-evaluation            AM-PAC PT "6 Clicks" Mobility   Outcome Measure  Help needed turning from your back to your side while in a flat bed without using bedrails?: A Little Help needed moving from lying  on your back to sitting on the side of a flat bed without using bedrails?: A Little Help needed moving to and from a bed to a chair (including a wheelchair)?: A Little Help needed standing up from a chair using your arms (e.g., wheelchair or bedside chair)?: A Little Help needed to walk in hospital room?: A Little Help needed climbing 3-5 steps with a railing? : A Lot 6 Click Score: 17    End of Session Equipment Utilized During Treatment: Gait belt Activity Tolerance: Patient tolerated treatment well Patient left: in chair;with call bell/phone within reach;with chair alarm set;with nursing/sitter in room Nurse Communication: Mobility status PT Visit Diagnosis: Muscle weakness (generalized) (M62.81);History of falling (Z91.81);Adult, failure to thrive (R62.7)     Time: [id number] PT Time Calculation (min) (ACUTE ONLY): 29 min  Charges:  $Gait Training: 8-22 mins $Therapeutic Activity: 8-22 mins                     Verner Mould, DPT Physical Therapist with [facility] [phone]  12/07/2019 2:15 PM

## 2019-12-07 NOTE — TOC Transition Note (Signed)
Transition of Care [facility]) - CM/SW Discharge Note   Patient Details  Name: Shawn Hendrix MRN: [id number] Date of Birth: 1951/08/13  Transition of Care [facility] CM/SW Contact:  Ross Ludwig, LCSW Phone Number: 12/07/2019, 4:17 PM   Clinical Narrative:     Patient to be d/c'ed today to [facility] room 121.  Patient and family agreeable to plans will transport via ems RN to call report to [phone].  CSW spoke to patient's POA [name] and he is aware that patient is discharging today.  Final next level of care: Skilled Nursing Facility Barriers to Discharge: Barriers Resolved   Patient Goals and CMS Choice Patient states their goals for this hospitalization and ongoing recovery are:: To go to snf for rehab, then return back home. CMS Medicare.gov Compare Post Acute Care list provided to:: Patient Choice offered to / list presented to : Patient, [facility] POA / Guardian  Discharge Placement   Existing PASRR number confirmed : 12/04/19          Patient chooses bed at: Other - please specify in the comment section below:([facility]) Patient to be transferred to facility by: [facility] Name of family member notified: [name] Patient and family notified of of transfer: 12/07/19  Discharge Plan and Services     Post Acute Care Choice: [place]            DME Agency: NA       HH Arranged: NA          Social Determinants of Health (SDOH) Interventions     Readmission Risk Interventions No flowsheet data found.

## 2019-12-07 NOTE — TOC Progression Note (Signed)
Transition of Care [facility]) - Progression Note    Patient Details  Name: Shawn Hendrix MRN: [id number] Date of Birth: 03/28/1951  [facility]) CM/SW Contact  Ross Ludwig, [place] Phone Number: 12/07/2019, 3:22 PM  Clinical Narrative:     CSW spoke to patient and his POA [name], patient is agreeable to going to a SNF for 30 days.  [facility] has accepted patient and can take him today.  CSW updated the attending physician.    Expected Discharge Plan: Skilled Nursing Facility Barriers to Discharge: SNF Pending bed offer  Expected Discharge Plan and Services Expected Discharge Plan: [place] Choice: [place] arrangements for the past 2 months: Single Family Home Expected Discharge Date: 12/07/19                 DME Agency: NA       HH Arranged: NA           Social Determinants of Health (SDOH) Interventions    Readmission Risk Interventions No flowsheet data found.

## 2020-01-18 ENCOUNTER — Encounter (HOSPITAL_COMMUNITY): Payer: Self-pay | Admitting: Emergency Medicine

## 2020-01-18 ENCOUNTER — Other Ambulatory Visit: Payer: Self-pay

## 2020-01-18 ENCOUNTER — Emergency Department (HOSPITAL_COMMUNITY): Payer: Medicare Other

## 2020-01-18 ENCOUNTER — Inpatient Hospital Stay (HOSPITAL_COMMUNITY)
Admission: EM | Admit: 2020-01-18 | Discharge: 2020-01-24 | DRG: 554 | Disposition: A | Discharge status: Skilled Nursing Facility | Payer: Medicare Other | Attending: Internal Medicine | Admitting: Internal Medicine

## 2020-01-18 DIAGNOSIS — D509 Iron deficiency anemia, unspecified: Secondary | ICD-10-CM | POA: Diagnosis present

## 2020-01-18 DIAGNOSIS — Z20822 Contact with and (suspected) exposure to covid-19: Secondary | ICD-10-CM | POA: Diagnosis present

## 2020-01-18 DIAGNOSIS — R262 Difficulty in walking, not elsewhere classified: Secondary | ICD-10-CM | POA: Diagnosis present

## 2020-01-18 DIAGNOSIS — I10 Essential (primary) hypertension: Secondary | ICD-10-CM

## 2020-01-18 DIAGNOSIS — R5381 Other malaise: Secondary | ICD-10-CM | POA: Diagnosis present

## 2020-01-18 DIAGNOSIS — R6510 Systemic inflammatory response syndrome (SIRS) of non-infectious origin without acute organ dysfunction: Secondary | ICD-10-CM

## 2020-01-18 DIAGNOSIS — R509 Fever, unspecified: Secondary | ICD-10-CM

## 2020-01-18 DIAGNOSIS — Z6823 Body mass index (BMI) 23.0-23.9, adult: Secondary | ICD-10-CM

## 2020-01-18 DIAGNOSIS — Z809 Family history of malignant neoplasm, unspecified: Secondary | ICD-10-CM

## 2020-01-18 DIAGNOSIS — M109 Gout, unspecified: Principal | ICD-10-CM

## 2020-01-18 DIAGNOSIS — D649 Anemia, unspecified: Secondary | ICD-10-CM

## 2020-01-18 DIAGNOSIS — E538 Deficiency of other specified B group vitamins: Secondary | ICD-10-CM | POA: Diagnosis present

## 2020-01-18 DIAGNOSIS — M1990 Unspecified osteoarthritis, unspecified site: Secondary | ICD-10-CM | POA: Diagnosis present

## 2020-01-18 DIAGNOSIS — F1011 Alcohol abuse, in remission: Secondary | ICD-10-CM | POA: Diagnosis present

## 2020-01-18 DIAGNOSIS — E871 Hypo-osmolality and hyponatremia: Secondary | ICD-10-CM | POA: Diagnosis present

## 2020-01-18 DIAGNOSIS — Z8546 Personal history of malignant neoplasm of prostate: Secondary | ICD-10-CM

## 2020-01-18 DIAGNOSIS — R569 Unspecified convulsions: Secondary | ICD-10-CM | POA: Diagnosis present

## 2020-01-18 DIAGNOSIS — E440 Moderate protein-calorie malnutrition: Secondary | ICD-10-CM

## 2020-01-18 LAB — COMPREHENSIVE METABOLIC PANEL
ALT: 11 U/L (ref 0–44)
AST: 15 U/L (ref 15–41)
Albumin: 3.2 g/dL — ABNORMAL LOW (ref 3.5–5.0)
Alkaline Phosphatase: 49 U/L (ref 38–126)
Anion gap: 13 (ref 5–15)
BUN: 19 mg/dL (ref 8–23)
CO2: 25 mmol/L (ref 22–32)
Calcium: 9 mg/dL (ref 8.9–10.3)
Chloride: 92 mmol/L — ABNORMAL LOW (ref 98–111)
Creatinine, Ser: 0.9 mg/dL (ref 0.61–1.24)
GFR calc Af Amer: >60 mL/min (ref >60)
GFR calc non Af Amer: >60 mL/min (ref >60)
Glucose, Bld: 141 mg/dL — ABNORMAL HIGH (ref 70–99)
Potassium: 4.2 mmol/L (ref 3.5–5.1)
Sodium: 130 mmol/L — ABNORMAL LOW (ref 135–145)
Total Bilirubin: 1.5 mg/dL — ABNORMAL HIGH (ref 0.3–1.2)
Total Protein: 7.5 g/dL (ref 6.5–8.1)

## 2020-01-18 LAB — URIC ACID: Uric Acid, Serum: 6 mg/dL (ref 3.7–8.6)

## 2020-01-18 LAB — CK: Total CK: 19 U/L — ABNORMAL LOW (ref 49–397)

## 2020-01-18 LAB — CBC WITH DIFFERENTIAL/PLATELET
Abs Immature Granulocytes: 0.1 10*3/uL — ABNORMAL HIGH (ref 0.00–0.07)
Basophils Absolute: 0 10*3/uL (ref 0.0–0.1)
Basophils Relative: 0 %
Eosinophils Absolute: 0 10*3/uL (ref 0.0–0.5)
Eosinophils Relative: 0 %
HCT: 26.4 % — ABNORMAL LOW (ref 39.0–52.0)
Hemoglobin: 8.7 g/dL — ABNORMAL LOW (ref 13.0–17.0)
Immature Granulocytes: 1 %
Lymphocytes Relative: 4 %
Lymphs Abs: 0.5 10*3/uL — ABNORMAL LOW (ref 0.7–4.0)
MCH: 31 pg (ref 26.0–34.0)
MCHC: 33 g/dL (ref 30.0–36.0)
MCV: 94 fL (ref 80.0–100.0)
Monocytes Absolute: 1.3 10*3/uL — ABNORMAL HIGH (ref 0.1–1.0)
Monocytes Relative: 11 %
Neutro Abs: 10.7 10*3/uL — ABNORMAL HIGH (ref 1.7–7.7)
Neutrophils Relative %: 84 %
Platelets: 284 10*3/uL (ref 150–400)
RBC: 2.81 MIL/uL — ABNORMAL LOW (ref 4.22–5.81)
RDW: 16.3 % — ABNORMAL HIGH (ref 11.5–15.5)
WBC: 12.6 10*3/uL — ABNORMAL HIGH (ref 4.0–10.5)
nRBC: 0 % (ref 0.0–0.2)

## 2020-01-18 LAB — ETHANOL: Alcohol, Ethyl (B): <10 mg/dL (ref <10)

## 2020-01-18 LAB — URINALYSIS, ROUTINE W REFLEX MICROSCOPIC
Bacteria, UA: NONE SEEN
Bilirubin Urine: NEGATIVE
Glucose, UA: NEGATIVE mg/dL
Hgb urine dipstick: NEGATIVE
Ketones, ur: 5 mg/dL — AB
Leukocytes,Ua: NEGATIVE
Nitrite: NEGATIVE
Protein, ur: 100 mg/dL — AB
Specific Gravity, Urine: 1.023 (ref 1.005–1.030)
pH: 5 (ref 5.0–8.0)

## 2020-01-18 LAB — PROTIME-INR
INR: 1.1 (ref 0.8–1.2)
Prothrombin Time: 13.5 seconds (ref 11.4–15.2)

## 2020-01-18 LAB — SYNOVIAL CELL COUNT + DIFF, W/ CRYSTALS
Monocyte-Macrophage-Synovial Fluid: 10 % — ABNORMAL LOW (ref 50–90)
Neutrophil, Synovial: 90 % — ABNORMAL HIGH (ref 0–25)
WBC, Synovial: 47500 /mm3 — ABNORMAL HIGH (ref 0–200)

## 2020-01-18 LAB — SARS CORONAVIRUS 2 BY RT PCR (HOSPITAL ORDER, PERFORMED IN ~~LOC~~ HOSPITAL LAB): SARS Coronavirus 2: NEGATIVE

## 2020-01-18 LAB — APTT: aPTT: 39 seconds — ABNORMAL HIGH (ref 24–36)

## 2020-01-18 LAB — SODIUM, URINE, RANDOM: Sodium, Ur: 15 mmol/L

## 2020-01-18 LAB — OSMOLALITY, URINE: Osmolality, Ur: 622 mOsm/kg (ref 300–900)

## 2020-01-18 LAB — PHOSPHORUS: Phosphorus: 2.8 mg/dL (ref 2.5–4.6)

## 2020-01-18 LAB — FOLATE: Folate: 49.8 ng/mL (ref >5.9)

## 2020-01-18 LAB — PROCALCITONIN: Procalcitonin: 1.49 ng/mL

## 2020-01-18 LAB — LACTIC ACID, PLASMA: Lactic Acid, Venous: 1.5 mmol/L (ref 0.5–1.9)

## 2020-01-18 LAB — RETICULOCYTES
Immature Retic Fract: 15.9 % (ref 2.3–15.9)
RBC.: 2.74 MIL/uL — ABNORMAL LOW (ref 4.22–5.81)
Retic Count, Absolute: 78.9 10*3/uL (ref 19.0–186.0)
Retic Ct Pct: 2.9 % (ref 0.4–3.1)

## 2020-01-18 LAB — VITAMIN B12: Vitamin B-12: 717 pg/mL (ref 180–914)

## 2020-01-18 LAB — TROPONIN I (HIGH SENSITIVITY)
Troponin I (High Sensitivity): 4 ng/L (ref <18)
Troponin I (High Sensitivity): 5 ng/L (ref <18)

## 2020-01-18 LAB — CREATININE, URINE, RANDOM: Creatinine, Urine: 228.89 mg/dL

## 2020-01-18 LAB — IRON AND TIBC
Iron: 10 ug/dL — ABNORMAL LOW (ref 45–182)
Saturation Ratios: 6 % — ABNORMAL LOW (ref 17.9–39.5)
TIBC: 181 ug/dL — ABNORMAL LOW (ref 250–450)
UIBC: 171 ug/dL

## 2020-01-18 LAB — MAGNESIUM: Magnesium: 1.4 mg/dL — ABNORMAL LOW (ref 1.7–2.4)

## 2020-01-18 LAB — SEDIMENTATION RATE: Sed Rate: 143 mm/hr — ABNORMAL HIGH (ref 0–16)

## 2020-01-18 LAB — FERRITIN: Ferritin: 999 ng/mL — ABNORMAL HIGH (ref 24–336)

## 2020-01-18 MED ORDER — SODIUM CHLORIDE 0.9 % IV SOLN
2.0000 g | Freq: Two times a day (BID) | INTRAVENOUS | Status: DC
  Administered 2020-01-19: 2 g via INTRAVENOUS
  Filled 2020-01-18: qty 2

## 2020-01-18 MED ORDER — SODIUM CHLORIDE 0.9 % IV BOLUS
1000.0000 mL | Freq: Once | INTRAVENOUS | Status: AC
  Administered 2020-01-18: 1000 mL via INTRAVENOUS

## 2020-01-18 MED ORDER — ACETAMINOPHEN 325 MG PO TABS
650.0000 mg | ORAL_TABLET | Freq: Once | ORAL | Status: AC
  Administered 2020-01-18: 650 mg via ORAL
  Filled 2020-01-18: qty 2

## 2020-01-18 MED ORDER — SODIUM CHLORIDE 0.9 % IV SOLN
2.0000 g | Freq: Once | INTRAVENOUS | Status: AC
  Administered 2020-01-18: 2 g via INTRAVENOUS
  Filled 2020-01-18: qty 2

## 2020-01-18 MED ORDER — VANCOMYCIN HCL IN DEXTROSE 1-5 GM/200ML-% IV SOLN
1000.0000 mg | Freq: Once | INTRAVENOUS | Status: AC
  Administered 2020-01-18: 1000 mg via INTRAVENOUS
  Filled 2020-01-18: qty 200

## 2020-01-18 MED ORDER — COLCHICINE 0.6 MG PO TABS
0.6000 mg | ORAL_TABLET | Freq: Three times a day (TID) | ORAL | Status: AC
  Administered 2020-01-19 (×2): 0.6 mg via ORAL
  Filled 2020-01-18 (×4): qty 1

## 2020-01-18 MED ORDER — VANCOMYCIN HCL 500 MG/100ML IV SOLN
500.0000 mg | Freq: Two times a day (BID) | INTRAVENOUS | Status: DC
  Administered 2020-01-19: 500 mg via INTRAVENOUS
  Filled 2020-01-18 (×4): qty 100

## 2020-01-18 MED ORDER — LIDOCAINE-EPINEPHRINE 2 %-1:100000 IJ SOLN
20.0000 mL | Freq: Once | INTRAMUSCULAR | Status: AC
  Administered 2020-01-18: 1 mL via INTRADERMAL
  Filled 2020-01-18: qty 1

## 2020-01-18 MED ORDER — METRONIDAZOLE IN NACL 5-0.79 MG/ML-% IV SOLN
500.0000 mg | Freq: Three times a day (TID) | INTRAVENOUS | Status: DC
  Administered 2020-01-18 – 2020-01-19 (×2): 500 mg via INTRAVENOUS
  Filled 2020-01-18 (×4): qty 100

## 2020-01-18 MED ORDER — METRONIDAZOLE IN NACL 5-0.79 MG/ML-% IV SOLN
500.0000 mg | Freq: Once | INTRAVENOUS | Status: AC
  Administered 2020-01-18: 500 mg via INTRAVENOUS
  Filled 2020-01-18: qty 100

## 2020-01-18 MED ORDER — MAGNESIUM SULFATE 2 GM/50ML IV SOLN
2.0000 g | Freq: Once | INTRAVENOUS | Status: AC
  Administered 2020-01-18: 2 g via INTRAVENOUS
  Filled 2020-01-18: qty 50

## 2020-01-18 NOTE — Progress Notes (Signed)
Pharmacy Antibiotic Note  Shawn Hendrix is a 69 y.o. male admitted on 01/18/2020 with sepsis.  Pharmacy has been consulted for vanc/cefepime dosing.  Plan: 1) Vanc 1g x 1 then 500mg  IV q12 - goal trough 15-20 2) Cefepime 2g IV q12  Height: 5' (152.4 cm) Weight: 54 kg (119 lb 0.8 oz) IBW/kg (Calculated) : 50  Temp (24hrs), Avg:100.3 F (37.9 C), Min:98.8 F (37.1 C), Max:101.5 F (38.6 C)  Recent Labs  Lab 01/18/20 1258  WBC 12.6*  CREATININE 0.90  LATICACIDVEN 1.5    Estimated Creatinine Clearance: 54.8 mL/min (by C-G formula based on SCr of 0.9 mg/dL).    No Known Allergies   Thank you for allowing pharmacy to be a part of this patient's care.  Kara Mead 01/18/2020 9:17 PM

## 2020-01-18 NOTE — H&P (Addendum)
Shawn Hendrix NOT:[id number] DOB: 1950-12-15 DOA: 01/18/2020     PCP: Javier Docker, MD   Outpatient Specialists:     GI  Dr. Henrene Pastor (  [facility])   Patient arrived to ER on 01/18/20 at 1216  Patient coming from: home Lives alone,   Chief Complaint:   Chief Complaint  Patient presents with  . Fatigue  . suspected sepsis    HPI: Shawn Hendrix is a 69 y.o. male with medical history significant of history of alcohol abuse hypertension vitamin B-12 deficiency, anemia gout prostate cancer malnutrition alcohol withdrawal seizures history of failure to thrive    Presented with generalized weakness from home also reported again he has left knee pain and wrist pain had history of gout was found in the bed soaked in urine has been down for the past few days.  Patient states he spent about 3 weeks at the nursing home but then was discharged back to home to care of his girlfriend.  His brother has been making sure that he has been taking his medications.  He has now home therapy it is unclear for seeing him already if he is in the process of getting that set up.  Patient reports his girlfriend has torn on the fan and his leg got cold and thereafter gotten really stiff and he had hard time moving it.  He can get off the couch.  His brother was very concerned and called 911.  Patient reports his only complaint has been that his left knee and left wrist has been bothering him and that only started after he was exposed to cold air. He was not aware of having any fevers or chills.  He has mild chronic cough that is unchanged.  No nausea no vomiting no diarrhea no chest pain no shortness of breath.  States since he got home he only have had 3 beers 1 week ago and has not   Drank since  Recently was admitted for presumed sepsis secondary to colitis in end of April 2021 was discharged to SNF.  Completed full course of treatment He had generalized fatigue and had extensive work-up done including MRI  brain MRI left knee and aspiration that was consistent with gout Had an echogram done that showed preserved EF  Infectious risk factors:  Reports fever, shortness of breath,     Has  been vaccinated against COVID times once April is due for his second shot  In  ER COVID TEST  NEGATIVE   Lab Results  Component Value Date   [place] 01/18/2020   [place] NEGATIVE 12/06/2019   [place] NEGATIVE 11/28/2019    Regarding pertinent Chronic problems:      HTN on diltiazem, lisinopril   Gout - on cholcine and allopurinol  While in ER: On presentation fever, tachypnea  Left knee was tapped showing crystals and elevated WBC Culture pending No source of infection noted    Hospitalist was called for admission for SIRS  The following Work up has been ordered so far:  Orders Placed This Encounter  Procedures  . Blood Culture (routine x 2)  . Urine culture  . SARS Coronavirus 2 by RT PCR (hospital order, performed in [facility] hospital lab) Nasopharyngeal Nasopharyngeal Swab  . Body fluid culture  . DG Chest Port 1 View  . DG Wrist Complete Left  . DG Wrist Complete Right  . DG Knee Complete 4 Views Left  . Comprehensive metabolic panel  . CBC  WITH DIFFERENTIAL  . APTT  . Protime-INR  . CK  . Urinalysis, Routine w reflex microscopic  . Glucose, Body Fluid Other  . Protein, body fluid (other)  . Synovial cell count + diff, w/ crystals  . Diet NPO time specified  . Check Rectal Temperature  . Cardiac monitoring  . Refer to Sidebar Report: Sepsis Sidebar ED/IP  . Document vital signs within 1-hour of fluid bolus completion and notify provider of bolus completion  . Document height and weight  . Insert peripheral IV x 2  . Initiate Carrier Fluid Protocol  . In and Out Cath  . Arthrocentesis equipment to bedside Supplies: Chlorhexidine swabs, Sterile 50cc lure lock syringe - for  fluid collection, 5cc syringes, Sterile red syringe caps, Two - 18 gauge  needles, 25 gauge needles, Sterile gloves  . Initiate Code Sepsis ([facility] [phone])  When activated, this will prioritize pharmacy, lab, and radiology services for this patient for STAT collections and interventions.  . Consult to hospitalist  ALL PATIENTS BEING ADMITTED/HAVING PROCEDURES NEED COVID-19 SCREENING  . Pulse oximetry, continuous  . ED EKG 12-Lead  . EKG 12-Lead    Following Medications were ordered in ER: Medications  ceFEPIme (MAXIPIME) 2 g in sodium chloride 0.9 % 100 mL IVPB (0 g Intravenous Stopped 01/18/20 1346)  metroNIDAZOLE (FLAGYL) IVPB 500 mg (0 mg Intravenous Stopped 01/18/20 1358)  vancomycin (VANCOCIN) IVPB 1000 mg/200 mL premix (0 mg Intravenous Stopped 01/18/20 1512)  sodium chloride 0.9 % bolus 1,000 mL (0 mLs Intravenous Stopped 01/18/20 1447)  acetaminophen (TYLENOL) tablet 650 mg (650 mg Oral Given 01/18/20 1314)  lidocaine-EPINEPHrine (XYLOCAINE W/EPI) 2 %-1:100000 (with pres) injection 20 mL (1 mL Intradermal Given 01/18/20 1656)  sodium chloride 0.9 % bolus 1,000 mL (0 mLs Intravenous Stopped 01/18/20 1656)        Consult Orders  (From admission, onward)         Start     Ordered   01/18/20 2009  Consult to hospitalist  ALL PATIENTS BEING ADMITTED/HAVING PROCEDURES NEED COVID-19 SCREENING  Once    Comments: ALL PATIENTS BEING ADMITTED/HAVING PROCEDURES NEED COVID-19 SCREENING  Provider:  (Not yet assigned)  Question Answer Comment  Place call to: [facility]   Reason for Consult Admit      01/18/20 2008           Significant initial  Findings: Abnormal Labs Reviewed  COMPREHENSIVE METABOLIC PANEL - Abnormal; Notable for the following components:      Result Value   Sodium 130 (*)    Chloride 92 (*)    Glucose, Bld 141 (*)    Albumin 3.2 (*)    Total Bilirubin 1.5 (*)    All other components within normal limits  CBC WITH DIFFERENTIAL/PLATELET - Abnormal; Notable for the following components:   WBC 12.6 (*)    RBC 2.81 (*)     Hemoglobin 8.7 (*)    HCT 26.4 (*)    RDW 16.3 (*)    Neutro Abs 10.7 (*)    Lymphs Abs 0.5 (*)    Monocytes Absolute 1.3 (*)    Abs Immature Granulocytes 0.10 (*)    All other components within normal limits  APTT - Abnormal; Notable for the following components:   aPTT 39 (*)    All other components within normal limits  CK - Abnormal; Notable for the following components:   Total CK 19 (*)    All other components within normal limits  URINALYSIS, ROUTINE W REFLEX  MICROSCOPIC - Abnormal; Notable for the following components:   Color, Urine AMBER (*)    Ketones, ur 5 (*)    Protein, ur 100 (*)    All other components within normal limits  SYNOVIAL CELL COUNT + DIFF, W/ CRYSTALS - Abnormal; Notable for the following components:   Appearance-Synovial TURBID (*)    WBC, Synovial 47,500 (*)    Neutrophil, Synovial 90 (*)    Monocyte-Macrophage-Synovial Fluid 10 (*)    All other components within normal limits  MAGNESIUM - Abnormal; Notable for the following components:   Magnesium 1.4 (*)    All other components within normal limits    Otherwise labs showing:    Recent Labs  Lab 01/18/20 1258  NA 130*  K 4.2  CO2 25  GLUCOSE 141*  BUN 19  CREATININE 0.90  CALCIUM 9.0    Cr   Stable,  Lab Results  Component Value Date   CREATININE 0.90 01/18/2020   CREATININE 0.79 12/07/2019   CREATININE 0.79 12/06/2019    Recent Labs  Lab 01/18/20 1258  AST 15  ALT 11  ALKPHOS 49  BILITOT 1.5*  PROT 7.5  ALBUMIN 3.2*   Lab Results  Component Value Date   CALCIUM 9.0 01/18/2020   PHOS 2.8 11/29/2019     WBC      Component Value Date/Time   WBC 12.6 (H) 01/18/2020 1258   ANC    Component Value Date/Time   NEUTROABS 10.7 (H) 01/18/2020 1258   ALC No components found for: LYMPHAB    Plt: Lab Results  Component Value Date   PLT 284 01/18/2020    Lactic Acid, Venous    Component Value Date/Time   LATICACIDVEN 1.5 01/18/2020 1258    Procalcitonin    Ordered   COVID-19 Labs  No results for input(s): DDIMER, FERRITIN, LDH, CRP in the last 72 hours.  Lab Results  Component Value Date   SARSCOV2NAA NEGATIVE 01/18/2020   SARSCOV2NAA NEGATIVE 12/06/2019   [place] NEGATIVE 11/28/2019       HG/HCT  Down from baseline see below    Component Value Date/Time   HGB 8.7 (L) 01/18/2020 1258   HCT 26.4 (L) 01/18/2020 1258    No results for input(s): LIPASE, AMYLASE in the last 168 hours. No results for input(s): AMMONIA in the last 168 hours.  No components found for: LABALBU    Cardiac Panel (last 3 results) Recent Labs    01/18/20 1258  CKTOTAL 19*      ECG: Ordered Personally reviewed by me showing: HR : 105  Rhythm:  Sinus tachycardia    no evidence of ischemic changes QTC 445  DM  labs:  HbA1C: Recent Labs    11/28/19 1953  HGBA1C 4.6*       UA   no evidence of UTI    Urine analysis:    Component Value Date/Time   COLORURINE AMBER (A) 01/18/2020 1406   APPEARANCEUR CLEAR 01/18/2020 1406   LABSPEC 1.023 01/18/2020 1406   PHURINE 5.0 01/18/2020 1406   GLUCOSEU NEGATIVE 01/18/2020 1406   HGBUR NEGATIVE 01/18/2020 1406   [place] 01/18/2020 1406   KETONESUR 5 (A) 01/18/2020 1406   PROTEINUR 100 (A) 01/18/2020 1406   UROBILINOGEN 1.0 04/15/2014 1013   NITRITE NEGATIVE 01/18/2020 1406   [place] 01/18/2020 1406    Ordered    CXR -  NON acute Plain imaging of Left knee   Osteomyelitis not completely excluded and if there is clinical evidence  of infection, recommend joint aspiration left wrist Severe SLAC wrist.     ED Triage Vitals  Enc Vitals Group     BP 01/18/20 1234 136/86     Pulse Rate 01/18/20 1234 (!) 107     Resp 01/18/20 1234 (!) 21     Temp 01/18/20 1238 (!) 101.5 F (38.6 C)     Temp Source 01/18/20 1238 Rectal     SpO2 01/18/20 1234 94 %     Weight 01/18/20 1238 119 lb 0.8 oz (54 kg)     Height 01/18/20 1238 5' (1.524 m)     Head Circumference --      Peak  Flow --      Pain Score 01/18/20 1238 5     Pain Loc --      Pain Edu? --      Excl. in [place]? --   TMAX(24)@       Latest  Blood pressure 129/87, pulse 95, temperature 98.8 F (37.1 C), temperature source Rectal, resp. rate (!) 21, height 5' (1.524 m), weight 54 kg, SpO2 100 %.   Review of Systems:    Pertinent positives include:  fatigue, decreased Po intake  Constitutional:  No weight loss, night sweats, Fevers, chills, weight loss  HEENT:  No headaches, Difficulty swallowing,Tooth/dental problems,Sore throat,  No sneezing, itching, ear ache, nasal congestion, post nasal drip,  Cardio-vascular:  No chest pain, Orthopnea, PND, anasarca, dizziness, palpitations.no Bilateral lower extremity swelling  GI:  No heartburn, indigestion, abdominal pain, nausea, vomiting, diarrhea, change in bowel habits, loss of appetite, melena, blood in stool, hematemesis Resp:  no shortness of breath at rest. No dyspnea on exertion, No excess mucus, no productive cough, No non-productive cough, No coughing up of blood.No change in color of mucus.No wheezing. Skin:  no rash or lesions. No jaundice GU:  no dysuria, change in color of urine, no urgency or frequency. No straining to urinate.  No flank pain.  Musculoskeletal:  No joint pain or no joint swelling. No decreased range of motion. No back pain.  Psych:  No change in mood or affect. No depression or anxiety. No memory loss.  Neuro: no localizing neurological complaints, no tingling, no weakness, no double vision, no gait abnormality, no slurred speech, no confusion  All systems reviewed and apart from [place] all are negative  Past Medical History:   Past Medical History:  Diagnosis Date  . Anemia   . Arthralgia   . Arthritis   . Gout   . Hypertension   . Prostate cancer ([place])   . Seizure [facility])      Past Surgical History:  Procedure Laterality Date  . EYE SURGERY     R eye removal  . PROSTATE BIOPSY      Social  History:  Ambulatory  Gilford Rile      reports that he has never smoked. He has never used smokeless tobacco. He reports current alcohol use of about 56.0 standard drinks of alcohol per week. He reports that he does not use drugs.  Family History:   Family History  Problem Relation Age of Onset  . Cancer Sister   . Cancer Sister     Allergies: No Known Allergies   Prior to Admission medications   Medication Sig Start Date End Date Taking? Authorizing Provider  allopurinol (ZYLOPRIM) 100 MG tablet Take 1 tablet (100 mg total) by mouth daily. 12/08/19  Yes Eugenie Filler, MD  colchicine 0.6 MG tablet Take 1 tablet (0.6  mg total) by mouth daily. 12/07/19  Yes Eugenie Filler, MD  diltiazem (CARDIZEM LA) 240 MG 24 hr tablet Take 240 mg by mouth daily. 01/05/20  Yes [provider]  feeding supplement (BOOST / RESOURCE BREEZE) LIQD Take 1 Container by mouth 3 (three) times daily between meals. 12/08/16  Yes Oswald Hillock, MD  ferrous sulfate 325 (65 FE) MG tablet Take 1 tablet (325 mg total) by mouth daily with breakfast. 12/09/16  Yes Darrick Meigs, Marge Duncans, MD  folic acid (FOLVITE) 1 MG tablet Take 1 tablet (1 mg total) by mouth daily. 12/09/16  Yes Oswald Hillock, MD  gabapentin (NEURONTIN) 300 MG capsule Take 300 mg by mouth 2 (two) times daily as needed (pain).  10/25/17  Yes [provider]  lisinopril (PRINIVIL,ZESTRIL) 20 MG tablet Take 1 tablet (20 mg total) by mouth daily. 12/09/16  Yes Oswald Hillock, MD  magnesium oxide (MAG-OX) 400 MG tablet Take 1 tablet by mouth daily. 01/05/20  Yes [provider]  multivitamin (PROSIGHT) TABS tablet Take 1 tablet by mouth daily. 12/09/16  Yes Oswald Hillock, MD  omeprazole (PRILOSEC) 20 MG capsule Take 20 mg by mouth 2 (two) times daily. 01/05/20  Yes [provider]  thiamine 100 MG tablet Take 1 tablet (100 mg total) by mouth daily. 12/09/16  Yes Oswald Hillock, MD  vitamin B-12 (CYANOCOBALAMIN) 1000 MCG tablet Take 1 tablet  (1,000 mcg total) by mouth daily. 12/07/19  Yes Eugenie Filler, MD  acetaminophen (TYLENOL) 325 MG tablet Take 2 tablets (650 mg total) by mouth every 6 (six) hours as needed for mild pain (or Fever >/= 101). Patient not taking: Reported on 01/18/2020 12/08/16   Oswald Hillock, MD  diltiazem (CARDIZEM CD) 240 MG 24 hr capsule Take 1 capsule (240 mg total) by mouth daily. Patient not taking: Reported on 01/18/2020 12/09/16   Oswald Hillock, MD  magnesium oxide (MAG-OX) 400 (241.3 Mg) MG tablet Take 1 tablet (400 mg total) by mouth 2 (two) times daily. Patient not taking: Reported on 01/18/2020 12/07/19   Eugenie Filler, MD  omeprazole (PRILOSEC) 40 MG capsule Take 1 capsule (40 mg total) by mouth daily. Patient not taking: Reported on 01/18/2020 12/07/19   Eugenie Filler, MD  traMADol (ULTRAM) 50 MG tablet Take 1 tablet (50 mg total) by mouth every 6 (six) hours as needed. Patient not taking: Reported on 01/18/2020 12/07/19   Eugenie Filler, MD   Physical Exam: Blood pressure 129/87, pulse 95, temperature 98.8 F (37.1 C), temperature source Rectal, resp. rate (!) 21, height 5' (1.524 m), weight 54 kg, SpO2 100 %. 1. General:  in No  Acute distress   Chronically ill  Cachectic  -appearing 2. Psychological: Alert and   Oriented 3. Head/ENT:    Dry Mucous Membranes                            Right eye enucleated                            Head Non traumatic, neck supple                          Poor Dentition 4. SKIN:   decreased Skin turgor,  Skin clean Dry and intact no rash 5. Heart: Regular rate and rhythm no Murmur, no Rub  or gallop 6. Lungs:   no wheezes or crackles   7. Abdomen: Soft non-tender, Non distended  bowel sounds present 8. Lower extremities: no clubbing, cyanosis, no edema 9. Neurologically generally weak has hard time moving left leg secondary to pain but able to move with passive motion 10. MSK: Normal range of motion except for left knee somewhat limited   All other  LABS:     Recent Labs  Lab 01/18/20 1258  WBC 12.6*  NEUTROABS 10.7*  HGB 8.7*  HCT 26.4*  MCV 94.0  PLT 284     Recent Labs  Lab 01/18/20 1258  NA 130*  K 4.2  CL 92*  CO2 25  GLUCOSE 141*  BUN 19  CREATININE 0.90  CALCIUM 9.0     Recent Labs  Lab 01/18/20 1258  AST 15  ALT 11  ALKPHOS 49  BILITOT 1.5*  PROT 7.5  ALBUMIN 3.2*       Cultures:    Component Value Date/Time   SDES SYNOVIAL KNEE 01/18/2020 1706   SPECREQUEST NONE 01/18/2020 1706   CULT PENDING 01/18/2020 1706   REPTSTATUS PENDING 01/18/2020 1706     Radiological Exams on Admission: DG Wrist Complete Left  Result Date: 01/18/2020 CLINICAL DATA:  Bilateral wrist pain. The patient reports a history of gout. EXAM: LEFT WRIST - COMPLETE 3+ VIEW COMPARISON:  Plain films of the left hand 11/28/2019. FINDINGS: No acute bony or joint abnormality. The proximal pole the scaphoid is severely diminutive and thinned. Severe proximal migration of the capitate is present. There is a large defect in the articular surface of the distal radius and adjacent capitate. Erosive change is also seen at the distal ulna. 2 loose osseous fragments project in the posterior aspect of the radiocarpal joint measuring up to approximately 1 cm in diameter. Soft tissues about the wrist are severely swollen. No chondrocalcinosis of the triangular fibrocartilage. No acute bony abnormality. Visualized MCP joints appear normal. IMPRESSION: No acute finding. Severe SLAC wrist. Cause for this finding is not definitely identified but may be due to remote avascular necrosis the scaphoid, trauma or arthropathy such as gout. The proximal pole the scaphoid is largely absent. Erosive change in the ulnar styloid and soft tissue swelling about the wrist are nonspecific but could be due to crystal or inflammatory arthropathy. Electronically Signed   By: Inge Rise M.D.   On: 01/18/2020 14:46   DG Wrist Complete Right  Result Date:  01/18/2020 CLINICAL DATA:  BILATERAL wrist and knee pain, history of gout, prostate cancer, hypertension, arthritis EXAM: RIGHT WRIST - COMPLETE 3+ VIEW COMPARISON:  None FINDINGS: Osseous demineralization. Larger erosion at the radial margin of the mid scaphoid. Mild scattered chondrocalcinosis. Joint space narrowing radiocarpal joint. Widening of scapholunate interval consistent with torn scapholunate ligament. No acute fracture, dislocation, or bone destruction. Significant soft tissue swelling at dorsum of hand overlying the CMC joints and proximal metacarpals. IMPRESSION: Scattered degenerative changes and chondrocalcinosis question CPPD. Torn scapholunate ligament. Erosion at mid scaphoid highly suggestive of gout. Electronically Signed   By: Lavonia Dana M.D.   On: 01/18/2020 14:31   DG Chest Port 1 View  Result Date: 01/18/2020 CLINICAL DATA:  Fever EXAM: PORTABLE CHEST 1 VIEW COMPARISON:  11/28/2019 FINDINGS: The heart size and mediastinal contours are stable. Atherosclerotic calcification of the aortic knob. No focal airspace consolidation, pleural effusion, or pneumothorax. Severe degenerative changes of the bilateral shoulders. IMPRESSION: No active disease. Electronically Signed   By: Davina Poke D.O.  On: 01/18/2020 13:29   DG Knee Complete 4 Views Left  Result Date: 01/18/2020 CLINICAL DATA:  LEFT knee pain, history gout EXAM: LEFT KNEE - COMPLETE 4+ VIEW COMPARISON:  None FINDINGS: Osseous demineralization. Scattered degenerative changes with joint space narrowing greatest at medial compartment. Spurring and fragmentation at inferior margin of patella again identified. Overlying anterior soft tissue swelling of the LEFT knee. Small associated LEFT knee joint effusion. No acute fracture, dislocation or bone destruction. Old healed proximal fibular metadiaphyseal fracture. Extensive small vessel vascular disease changes at knee and in runoff vessels. IMPRESSION: Degenerative changes of the  LEFT knee with irregularity, spurring and fragmentation at the inferior pole of the patella, could represent a posttraumatic or inflammatory etiology, associated with persistent overlying soft tissue swelling and small joint effusion. Osteomyelitis not completely excluded and if there is clinical evidence of infection, recommend joint aspiration. These results will be called to the ordering clinician or representative by the Radiologist Assistant, and communication documented in the PACS or [employer]. Electronically Signed   By: Lavonia Dana M.D.   On: 01/18/2020 14:38    Chart has been reviewed    Assessment/Plan  69 y.o. male with medical history significant of history of alcohol abuse hypertension vitamin B-12 deficiency, anemia gout prostate cancer malnutrition alcohol withdrawal seizures history of failure to thrive     Admitted for SIRS  Present on Admission: . SIRS (systemic inflammatory response syndrome) (HCC) -  -SIRS criteria met with  elevated white blood cell count,  tachycardia ,   fever.      without evidence of end organ damage   -Most likely source being:  Undetermined, possibly non-infectectious  - Obtain serial lactic acid and procalcitonin level.  - Initiate IV antibiotics for now given fever up to 101  - await results of blood and urine culture and joint culture  - Rehydrate    . Anemia -obtain anemia panel, given worsening from prior will Hemoccult stool  . Malnutrition of moderate degree -obtain prealbumin nutritional consult  . Acute gouty arthropathy -obtain uric acid level patient in the past had similar presentation with what felt to be more of acute gout.  Initial joint fluid more consistent with gout rather than true infection plain imaging nondiagnostic had recent MRI of left knee as well which did not completely rule out osteo but given fluid showed no evidence of infectious process and cultures were negative most likely noninfectious process, for now  treat with colchicine 0.6 TID for the first day and decreased to BID the next I hold off on prednisone for tonight given febrile illness although gout can at times produce a fever as well.  Hyponatremia.  Patient reports decreased p.o. intake appears to be dry we will gently rehydrate and follow obtain electrolytes  . Essential hypertension -right now stable resume diltiazem when able to tolerate hold lisinopril for now Monitor blood pressure  . Hypomagnesemia -will replace  . Alcohol abuse, in remission -monitor for any sign of withdrawal States has not been drinking for the past week or so currently does not appear to be in withdrawal  Other plan as per orders.  DVT prophylaxis:  SCD    Code Status:  FULL CODE  as per patient   I had personally discussed CODE STATUS with patient   Family Communication:   Family not at  Bedside    Disposition Plan:    To home once workup is complete and patient is stable   Following barriers for  discharge:                            Electrolytes corrected                             Pain controlled with PO medications                               Afebrile, white count improving able to transition to PO antibiotics                             Will need to be able to tolerate PO                            Will likely need home health,                                                  Would benefit from PT/OT eval prior to DC  Ordered                                     Consults called: none    Admission status:  ED Disposition    ED Disposition Condition [place]: [place] [[id number]]  Level of Care: Telemetry [5]  Admit to tele based on following criteria: Other see comments  Comments: sirs  Covid Evaluation: Confirmed COVID Negative  Diagnosis: SIRS (systemic inflammatory response syndrome) ([place]) [468032]  Admitting Physician: Toy Baker [[id number]]  Attending Physician: Toy Baker  [[id number]]        Obs     Level of care     tele  For   24H   Precautions: admitted as  Covid Negative    PPE: Used by the provider:   P100  eye Goggles,  Gloves   Stuti Sandin 01/18/2020, 10:00 PM    [facility]     after 2 AM please page floor coverage PA If 7AM-7PM, please contact the day team taking care of the patient using [email or web address]   Patient was evaluated in the context of the global COVID-19 pandemic, which necessitated consideration that the patient might be at risk for infection with the SARS-CoV-2 virus that causes COVID-19. Institutional protocols and algorithms that pertain to the evaluation of patients at risk for COVID-19 are in a state of rapid change based on information released by regulatory bodies including the CDC and federal and state organizations. These policies and algorithms were followed during the patient's care.

## 2020-01-18 NOTE — ED Triage Notes (Signed)
Arrives via EMS from home, C/C suspected sepsis, generalized weakness for the last 4-5 days. Complaining of L knee and bilateral wrist pain, hx of gout. Patient was in bed and found soaked in urine, assumed to have laid there for the last few days. Vitals RR 24, BP 130/78, P 108, O2 95% RA, T 99.2.

## 2020-01-18 NOTE — Progress Notes (Signed)
A consult was received from an ED provider for Vancomycin and Cefepime per pharmacy dosing.  The patient's profile has been reviewed for ht/wt/allergies/indication/available labs.    A one time order has already been placed by provider for Vancomycin 1g IV and Cefepime 2g IV.  Further antibiotics/pharmacy consults should be ordered by admitting physician if indicated.                       Thank you, Luiz Ochoa 01/18/2020  12:56 PM

## 2020-01-18 NOTE — ED Provider Notes (Signed)
[name] DEPT Provider Note   CSN: [id number] Arrival date & time: 01/18/20  1216     History Chief Complaint  Patient presents with  . Fatigue  . suspected sepsis    Shawn Hendrix is a 69 y.o. male.  HPI   Patient is a 69 year old male with a history of anemia, arthralgias, arthritis, gout, hypertension, prostate cancer, seizures, alcohol abuse, who presents to the emergency department today for evaluation due to concern for sepsis.  Patient has had generalized weakness for the last 4 to 5 days.  He is complaining of pain to the left knee and bilateral wrists.  He has a history of gout.  Per EMS patient was found in his bed soaked in urine and there was concern that he had been laying there for several days.  His vital signs were reassuring in route except for some mild tachycardia.  Patient states he has had an intermittent dry cough.  Denies any chest pain, shortness of breath, abdominal pain, nausea vomiting or diarrhea.  His main complaint is that his left knee and bilateral wrist hurt.  Reviewed records.  Patient was admitted in April/2021 for sepsis.  At that time he had a similar presentation of pain in the knee and wrists.  He had an arthrocentesis of the left knee that showed evidence of gout T arthritis.  There is also concern for possible rheumatoid arthritis on his wrist x-ray.  Past Medical History:  Diagnosis Date  . Anemia   . Arthralgia   . Arthritis   . Gout   . Hypertension   . Prostate cancer ([place])   . Seizure [facility])     Patient Active Problem List   Diagnosis Date Noted  . Vitamin B12 deficiency 12/07/2019  . Pain   . Acute left-sided weakness   . Acute gouty arthropathy   . Sepsis ([place])   . Colitis   . Folate deficiency   . Alcohol abuse   . Hypomagnesemia   . Essential hypertension   . Generalized weakness 11/28/2019  . Malnutrition of moderate degree 12/05/2016  . Seizure ([place]) 12/02/2016  . Alcohol  withdrawal seizure, uncomplicated ([place]) [id number]  . Anemia 12/02/2016  . Thrombocytopenia ([place]) 12/02/2016  . Hypokalemia 12/02/2016  . Malignant neoplasm of prostate ([place]) 10/31/2014    Past Surgical History:  Procedure Laterality Date  . EYE SURGERY     R eye removal  . PROSTATE BIOPSY         Family History  Problem Relation Age of Onset  . Cancer Sister   . Cancer Sister     Social History   Tobacco Use  . Smoking status: Never Smoker  . Smokeless tobacco: Never Used  Substance Use Topics  . Alcohol use: Yes    Alcohol/week: 56.0 standard drinks    Types: 56 Standard drinks or equivalent per week    Comment: pint of alcohol daily  . Drug use: No    Home Medications Prior to Admission medications   Medication Sig Start Date End Date Taking? Authorizing Provider  allopurinol (ZYLOPRIM) 100 MG tablet Take 1 tablet (100 mg total) by mouth daily. 12/08/19  Yes Eugenie Filler, MD  colchicine 0.6 MG tablet Take 1 tablet (0.6 mg total) by mouth daily. 12/07/19  Yes Eugenie Filler, MD  diltiazem (CARDIZEM LA) 240 MG 24 hr tablet Take 240 mg by mouth daily. 01/05/20  Yes [provider]  feeding supplement (BOOST / RESOURCE BREEZE) LIQD Take  1 Container by mouth 3 (three) times daily between meals. 12/08/16  Yes Oswald Hillock, MD  ferrous sulfate 325 (65 FE) MG tablet Take 1 tablet (325 mg total) by mouth daily with breakfast. 12/09/16  Yes Darrick Meigs, Marge Duncans, MD  folic acid (FOLVITE) 1 MG tablet Take 1 tablet (1 mg total) by mouth daily. 12/09/16  Yes Oswald Hillock, MD  gabapentin (NEURONTIN) 300 MG capsule Take 300 mg by mouth 2 (two) times daily as needed (pain).  10/25/17  Yes [provider]  lisinopril (PRINIVIL,ZESTRIL) 20 MG tablet Take 1 tablet (20 mg total) by mouth daily. 12/09/16  Yes Oswald Hillock, MD  magnesium oxide (MAG-OX) 400 MG tablet Take 1 tablet by mouth daily. 01/05/20  Yes [provider]  multivitamin (PROSIGHT) TABS tablet Take  1 tablet by mouth daily. 12/09/16  Yes Oswald Hillock, MD  omeprazole (PRILOSEC) 20 MG capsule Take 20 mg by mouth 2 (two) times daily. 01/05/20  Yes [provider]  thiamine 100 MG tablet Take 1 tablet (100 mg total) by mouth daily. 12/09/16  Yes Oswald Hillock, MD  vitamin B-12 (CYANOCOBALAMIN) 1000 MCG tablet Take 1 tablet (1,000 mcg total) by mouth daily. 12/07/19  Yes Eugenie Filler, MD  acetaminophen (TYLENOL) 325 MG tablet Take 2 tablets (650 mg total) by mouth every 6 (six) hours as needed for mild pain (or Fever >/= 101). Patient not taking: Reported on 01/18/2020 12/08/16   Oswald Hillock, MD  diltiazem (CARDIZEM CD) 240 MG 24 hr capsule Take 1 capsule (240 mg total) by mouth daily. Patient not taking: Reported on 01/18/2020 12/09/16   Oswald Hillock, MD  magnesium oxide (MAG-OX) 400 (241.3 Mg) MG tablet Take 1 tablet (400 mg total) by mouth 2 (two) times daily. Patient not taking: Reported on 01/18/2020 12/07/19   Eugenie Filler, MD  omeprazole (PRILOSEC) 40 MG capsule Take 1 capsule (40 mg total) by mouth daily. Patient not taking: Reported on 01/18/2020 12/07/19   Eugenie Filler, MD  traMADol (ULTRAM) 50 MG tablet Take 1 tablet (50 mg total) by mouth every 6 (six) hours as needed. Patient not taking: Reported on 01/18/2020 12/07/19   Eugenie Filler, MD    Allergies    Patient has no known allergies.  Review of Systems   Review of Systems  Constitutional: Positive for fever. Negative for chills.  HENT: Negative for ear pain and sore throat.   Eyes: Negative for pain and visual disturbance.  Respiratory: Negative for cough and shortness of breath.   Cardiovascular: Negative for chest pain.  Gastrointestinal: Negative for abdominal pain, constipation, diarrhea, nausea and vomiting.  Genitourinary: Negative for dysuria and hematuria.  Musculoskeletal: Negative for back pain.       Left knee pain, bilat wrist pain  Skin: Negative for rash.  Neurological: Negative for  headaches.  All other systems reviewed and are negative.   Physical Exam Updated Vital Signs BP 129/87   Pulse 95   Temp 98.8 F (37.1 C) (Rectal)   Resp (!) 21   Ht 5' (1.524 m)   Wt 54 kg   SpO2 100%   BMI 23.25 kg/m   Physical Exam Vitals and nursing note reviewed.  Constitutional:      Appearance: He is well-developed.  HENT:     Head: Normocephalic and atraumatic.  Eyes:     Conjunctiva/sclera: Conjunctivae normal.  Cardiovascular:     Rate and Rhythm: Regular rhythm. Tachycardia present.  Heart sounds: Normal heart sounds. No murmur.  Pulmonary:     Effort: Pulmonary effort is normal. No respiratory distress.     Breath sounds: No wheezing, rhonchi or rales.  Abdominal:     General: Bowel sounds are normal.     Palpations: Abdomen is soft.     Tenderness: There is no abdominal tenderness. There is no guarding or rebound.  Musculoskeletal:     Cervical back: Neck supple.     Comments: TTP and swelling to the bilat wrists and left knee. Left knee with obvious effusion and warmth. No overlying erythema.   Skin:    General: Skin is warm and dry.  Neurological:     Mental Status: He is alert.     ED Results / Procedures / Treatments   Labs (all labs ordered are listed, but only abnormal results are displayed) Labs Reviewed  COMPREHENSIVE METABOLIC PANEL - Abnormal; Notable for the following components:      Result Value   Sodium 130 (*)    Chloride 92 (*)    Glucose, Bld 141 (*)    Albumin 3.2 (*)    Total Bilirubin 1.5 (*)    All other components within normal limits  CBC WITH DIFFERENTIAL/PLATELET - Abnormal; Notable for the following components:   WBC 12.6 (*)    RBC 2.81 (*)    Hemoglobin 8.7 (*)    HCT 26.4 (*)    RDW 16.3 (*)    Neutro Abs 10.7 (*)    Lymphs Abs 0.5 (*)    Monocytes Absolute 1.3 (*)    Abs Immature Granulocytes 0.10 (*)    All other components within normal limits  APTT - Abnormal; Notable for the following components:    aPTT 39 (*)    All other components within normal limits  CK - Abnormal; Notable for the following components:   Total CK 19 (*)    All other components within normal limits  URINALYSIS, ROUTINE W REFLEX MICROSCOPIC - Abnormal; Notable for the following components:   Color, Urine AMBER (*)    Ketones, ur 5 (*)    Protein, ur 100 (*)    All other components within normal limits  SYNOVIAL CELL COUNT + DIFF, W/ CRYSTALS - Abnormal; Notable for the following components:   Appearance-Synovial TURBID (*)    WBC, Synovial 47,500 (*)    Neutrophil, Synovial 90 (*)    Monocyte-Macrophage-Synovial Fluid 10 (*)    All other components within normal limits  SARS CORONAVIRUS 2 BY RT PCR (HOSPITAL ORDER, [facility])  BODY FLUID CULTURE  CULTURE, BLOOD (ROUTINE X 2)  CULTURE, BLOOD (ROUTINE X 2)  URINE CULTURE  LACTIC ACID, PLASMA  PROTIME-INR  GLUCOSE, BODY FLUID OTHER  PROTEIN, BODY FLUID (OTHER)  ETHANOL  TROPONIN I (HIGH SENSITIVITY)  TROPONIN I (HIGH SENSITIVITY)    EKG EKG Interpretation  Date/Time:  Tuesday January 18 2020 12:35:01 EDT Ventricular Rate:  105 PR Interval:    QRS Duration: 85 QT Interval:  335 QTC Calculation: 443 R Axis:   62 Text Interpretation: Sinus tachycardia Atrial premature complex Borderline low voltage, extremity leads ST elevation, consider inferior injury When compared to prior, no significant changes seen. No STEMI Confirmed by Antony Blackbird [phone]) on 01/18/2020 1:31:18 PM   Radiology DG Wrist Complete Left  Result Date: 01/18/2020 CLINICAL DATA:  Bilateral wrist pain. The patient reports a history of gout. EXAM: LEFT WRIST - COMPLETE 3+ VIEW COMPARISON:  Plain films of  the left hand 11/28/2019. FINDINGS: No acute bony or joint abnormality. The proximal pole the scaphoid is severely diminutive and thinned. Severe proximal migration of the capitate is present. There is a large defect in the articular surface of the  distal radius and adjacent capitate. Erosive change is also seen at the distal ulna. 2 loose osseous fragments project in the posterior aspect of the radiocarpal joint measuring up to approximately 1 cm in diameter. Soft tissues about the wrist are severely swollen. No chondrocalcinosis of the triangular fibrocartilage. No acute bony abnormality. Visualized MCP joints appear normal. IMPRESSION: No acute finding. Severe SLAC wrist. Cause for this finding is not definitely identified but may be due to remote avascular necrosis the scaphoid, trauma or arthropathy such as gout. The proximal pole the scaphoid is largely absent. Erosive change in the ulnar styloid and soft tissue swelling about the wrist are nonspecific but could be due to crystal or inflammatory arthropathy. Electronically Signed   By: Inge Rise M.D.   On: 01/18/2020 14:46   DG Wrist Complete Right  Result Date: 01/18/2020 CLINICAL DATA:  BILATERAL wrist and knee pain, history of gout, prostate cancer, hypertension, arthritis EXAM: RIGHT WRIST - COMPLETE 3+ VIEW COMPARISON:  None FINDINGS: Osseous demineralization. Larger erosion at the radial margin of the mid scaphoid. Mild scattered chondrocalcinosis. Joint space narrowing radiocarpal joint. Widening of scapholunate interval consistent with torn scapholunate ligament. No acute fracture, dislocation, or bone destruction. Significant soft tissue swelling at dorsum of hand overlying the CMC joints and proximal metacarpals. IMPRESSION: Scattered degenerative changes and chondrocalcinosis question CPPD. Torn scapholunate ligament. Erosion at mid scaphoid highly suggestive of gout. Electronically Signed   By: Lavonia Dana M.D.   On: 01/18/2020 14:31   DG Chest Port 1 View  Result Date: 01/18/2020 CLINICAL DATA:  Fever EXAM: PORTABLE CHEST 1 VIEW COMPARISON:  11/28/2019 FINDINGS: The heart size and mediastinal contours are stable. Atherosclerotic calcification of the aortic knob. No focal  airspace consolidation, pleural effusion, or pneumothorax. Severe degenerative changes of the bilateral shoulders. IMPRESSION: No active disease. Electronically Signed   By: Davina Poke D.O.   On: 01/18/2020 13:29   DG Knee Complete 4 Views Left  Result Date: 01/18/2020 CLINICAL DATA:  LEFT knee pain, history gout EXAM: LEFT KNEE - COMPLETE 4+ VIEW COMPARISON:  None FINDINGS: Osseous demineralization. Scattered degenerative changes with joint space narrowing greatest at medial compartment. Spurring and fragmentation at inferior margin of patella again identified. Overlying anterior soft tissue swelling of the LEFT knee. Small associated LEFT knee joint effusion. No acute fracture, dislocation or bone destruction. Old healed proximal fibular metadiaphyseal fracture. Extensive small vessel vascular disease changes at knee and in runoff vessels. IMPRESSION: Degenerative changes of the LEFT knee with irregularity, spurring and fragmentation at the inferior pole of the patella, could represent a posttraumatic or inflammatory etiology, associated with persistent overlying soft tissue swelling and small joint effusion. Osteomyelitis not completely excluded and if there is clinical evidence of infection, recommend joint aspiration. These results will be called to the ordering clinician or representative by the Radiologist Assistant, and communication documented in the PACS or [employer]. Electronically Signed   By: Lavonia Dana M.D.   On: 01/18/2020 14:38    Procedures .Joint Aspiration/Arthrocentesis  Date/Time: 01/18/2020 8:42 PM Performed by: Rodney Booze, PA-C Authorized by: Rodney Booze, PA-C   Consent:    Consent obtained:  Verbal   Consent given by:  Patient   Risks discussed:  Bleeding, infection, pain  and incomplete drainage   Alternatives discussed:  No treatment Location:    Location:  Knee   Knee:  L knee Anesthesia (see MAR for exact dosages):    Anesthesia method:  Local  infiltration   Local anesthetic:  Lidocaine 2% WITH epi Procedure details:    Preparation: Patient was prepped and draped in usual sterile fashion     Needle gauge:  20 G   Ultrasound guidance: no     Approach:  Superior   Aspirate amount:  20cc   Aspirate characteristics:  Yellow and clear   Steroid injected: no     Specimen collected: yes   Post-procedure details:    Dressing:  Adhesive bandage   Patient tolerance of procedure:  Tolerated well, no immediate complications   (including critical care time)  Medications Ordered in ED Medications  ceFEPIme (MAXIPIME) 2 g in sodium chloride 0.9 % 100 mL IVPB (0 g Intravenous Stopped 01/18/20 1346)  metroNIDAZOLE (FLAGYL) IVPB 500 mg (0 mg Intravenous Stopped 01/18/20 1358)  vancomycin (VANCOCIN) IVPB 1000 mg/200 mL premix (0 mg Intravenous Stopped 01/18/20 1512)  sodium chloride 0.9 % bolus 1,000 mL (0 mLs Intravenous Stopped 01/18/20 1447)  acetaminophen (TYLENOL) tablet 650 mg (650 mg Oral Given 01/18/20 1314)  lidocaine-EPINEPHrine (XYLOCAINE W/EPI) 2 %-1:100000 (with pres) injection 20 mL (1 mL Intradermal Given 01/18/20 1656)  sodium chloride 0.9 % bolus 1,000 mL (0 mLs Intravenous Stopped 01/18/20 1656)    ED Course  I have reviewed the triage vital signs and the nursing notes.  Pertinent labs & imaging results that were available during my care of the patient were reviewed by me and considered in my medical decision making (see chart for details).    MDM Rules/Calculators/A&P                      Pt is a 69 year old male presenting for evaluation of fatigue and concern for sepsis. On arrival patient tachycardic, tachypneic, febrile. No evidence of hypotension.  Code sepsis called on arrival. Broad spectrum abx given. ivf given.   Reviewed/interpreted lab CBC with mild leukocytosis CMP with low sodium of 130, low chloride at 92, mildly elevated bilirubin at 1.5, normal LFTs and kidney function. Lactic acid is negative PTT is slightly  long, PT/INR is normal CK is slightly elevated Troponin is negative Blood cultures obtained UA neg for UTI  EKG Sinus tachycardia Atrial premature complex Borderline low voltage, extremity leads ST elevation, consider inferior injury When compared to prior, no significant changes seen. No STEMI Confirmed   CXR No active disease. XRAY right wrist with scattered degenerative changes and chondrocalcinosis question CPPD. Torn scapholunate ligament. Erosion at mid scaphoid highly suggestive of gout. XRAY left wrist with no acute finding. Severe SLAC wrist. Cause for this finding is not definitely identified but may be due to remote avascular necrosis the scaphoid, trauma or arthropathy such as gout. The proximal pole the scaphoid is largely absent. Erosive change in the ulnar styloid and soft tissue swelling about the wrist are nonspecific but could be due to crystal or inflammatory arthropathy.  XRAY left knee with d egenerative changes of the LEFT knee with irregularity, spurring and fragmentation at the inferior pole of the patella, could represent a posttraumatic or inflammatory etiology, associated with persistent overlying soft tissue swelling and small joint effusion. Osteomyelitis not completely excluded and if there is clinical evidence of infection, recommend joint aspiration.   Arthrocentesis performed,  Culture with WBCs PMN and mono nuclear  with no organisms seen WBCs at 47,000, with increased neutrophils and monocytes.  Intracellular monosodium urate crystals were noted   - Suspect symptoms related to gout rather than septic arthritis.  8:53 PM CONSULT With Dr. Roel Cluck With hospitalist service who accepts patient for admission.   Final Clinical Impression(s) / ED Diagnoses Final diagnoses:  Fever, unspecified fever cause    Rx / DC Orders ED Discharge Orders    None       Bishop Dublin 01/18/20 2053    Tegeler, Gwenyth Allegra, MD 01/20/20 2108

## 2020-01-19 ENCOUNTER — Encounter (HOSPITAL_COMMUNITY): Payer: Self-pay | Admitting: Internal Medicine

## 2020-01-19 DIAGNOSIS — I10 Essential (primary) hypertension: Secondary | ICD-10-CM | POA: Diagnosis present

## 2020-01-19 DIAGNOSIS — R569 Unspecified convulsions: Secondary | ICD-10-CM | POA: Diagnosis present

## 2020-01-19 DIAGNOSIS — Z8546 Personal history of malignant neoplasm of prostate: Secondary | ICD-10-CM | POA: Diagnosis not present

## 2020-01-19 DIAGNOSIS — E871 Hypo-osmolality and hyponatremia: Secondary | ICD-10-CM | POA: Diagnosis present

## 2020-01-19 DIAGNOSIS — Z20822 Contact with and (suspected) exposure to covid-19: Secondary | ICD-10-CM | POA: Diagnosis present

## 2020-01-19 DIAGNOSIS — E440 Moderate protein-calorie malnutrition: Secondary | ICD-10-CM | POA: Diagnosis present

## 2020-01-19 DIAGNOSIS — R262 Difficulty in walking, not elsewhere classified: Secondary | ICD-10-CM | POA: Diagnosis present

## 2020-01-19 DIAGNOSIS — R5381 Other malaise: Secondary | ICD-10-CM | POA: Diagnosis present

## 2020-01-19 DIAGNOSIS — Z809 Family history of malignant neoplasm, unspecified: Secondary | ICD-10-CM | POA: Diagnosis not present

## 2020-01-19 DIAGNOSIS — M109 Gout, unspecified: Secondary | ICD-10-CM | POA: Diagnosis present

## 2020-01-19 DIAGNOSIS — R509 Fever, unspecified: Secondary | ICD-10-CM | POA: Diagnosis present

## 2020-01-19 DIAGNOSIS — D649 Anemia, unspecified: Secondary | ICD-10-CM | POA: Diagnosis not present

## 2020-01-19 DIAGNOSIS — E538 Deficiency of other specified B group vitamins: Secondary | ICD-10-CM | POA: Diagnosis present

## 2020-01-19 DIAGNOSIS — Z6823 Body mass index (BMI) 23.0-23.9, adult: Secondary | ICD-10-CM | POA: Diagnosis not present

## 2020-01-19 DIAGNOSIS — F1011 Alcohol abuse, in remission: Secondary | ICD-10-CM | POA: Diagnosis present

## 2020-01-19 DIAGNOSIS — M1990 Unspecified osteoarthritis, unspecified site: Secondary | ICD-10-CM | POA: Diagnosis present

## 2020-01-19 DIAGNOSIS — D509 Iron deficiency anemia, unspecified: Secondary | ICD-10-CM | POA: Diagnosis present

## 2020-01-19 DIAGNOSIS — R6510 Systemic inflammatory response syndrome (SIRS) of non-infectious origin without acute organ dysfunction: Secondary | ICD-10-CM | POA: Diagnosis present

## 2020-01-19 LAB — CBC WITH DIFFERENTIAL/PLATELET
Abs Immature Granulocytes: 0.08 10*3/uL — ABNORMAL HIGH (ref 0.00–0.07)
Basophils Absolute: 0 10*3/uL (ref 0.0–0.1)
Basophils Relative: 0 %
Eosinophils Absolute: 0 10*3/uL (ref 0.0–0.5)
Eosinophils Relative: 0 %
HCT: 25.2 % — ABNORMAL LOW (ref 39.0–52.0)
Hemoglobin: 8.2 g/dL — ABNORMAL LOW (ref 13.0–17.0)
Immature Granulocytes: 1 %
Lymphocytes Relative: 4 %
Lymphs Abs: 0.5 10*3/uL — ABNORMAL LOW (ref 0.7–4.0)
MCH: 30.5 pg (ref 26.0–34.0)
MCHC: 32.5 g/dL (ref 30.0–36.0)
MCV: 93.7 fL (ref 80.0–100.0)
Monocytes Absolute: 1.2 10*3/uL — ABNORMAL HIGH (ref 0.1–1.0)
Monocytes Relative: 9 %
Neutro Abs: 11.2 10*3/uL — ABNORMAL HIGH (ref 1.7–7.7)
Neutrophils Relative %: 86 %
Platelets: 252 10*3/uL (ref 150–400)
RBC: 2.69 MIL/uL — ABNORMAL LOW (ref 4.22–5.81)
RDW: 16.5 % — ABNORMAL HIGH (ref 11.5–15.5)
WBC: 13 10*3/uL — ABNORMAL HIGH (ref 4.0–10.5)
nRBC: 0 % (ref 0.0–0.2)

## 2020-01-19 LAB — HIV ANTIBODY (ROUTINE TESTING W REFLEX): HIV Screen 4th Generation wRfx: NONREACTIVE

## 2020-01-19 LAB — COMPREHENSIVE METABOLIC PANEL
ALT: 11 U/L (ref 0–44)
AST: 13 U/L — ABNORMAL LOW (ref 15–41)
Albumin: 2.6 g/dL — ABNORMAL LOW (ref 3.5–5.0)
Alkaline Phosphatase: 44 U/L (ref 38–126)
Anion gap: 13 (ref 5–15)
BUN: 14 mg/dL (ref 8–23)
CO2: 21 mmol/L — ABNORMAL LOW (ref 22–32)
Calcium: 8.1 mg/dL — ABNORMAL LOW (ref 8.9–10.3)
Chloride: 97 mmol/L — ABNORMAL LOW (ref 98–111)
Creatinine, Ser: 0.78 mg/dL (ref 0.61–1.24)
GFR calc Af Amer: >60 mL/min (ref >60)
GFR calc non Af Amer: >60 mL/min (ref >60)
Glucose, Bld: 95 mg/dL (ref 70–99)
Potassium: 3.7 mmol/L (ref 3.5–5.1)
Sodium: 131 mmol/L — ABNORMAL LOW (ref 135–145)
Total Bilirubin: 1.5 mg/dL — ABNORMAL HIGH (ref 0.3–1.2)
Total Protein: 6.4 g/dL — ABNORMAL LOW (ref 6.5–8.1)

## 2020-01-19 LAB — TSH: TSH: 0.421 u[IU]/mL (ref 0.350–4.500)

## 2020-01-19 LAB — OSMOLALITY: Osmolality: 274 mOsm/kg — ABNORMAL LOW (ref 275–295)

## 2020-01-19 LAB — MAGNESIUM: Magnesium: 1.7 mg/dL (ref 1.7–2.4)

## 2020-01-19 LAB — PREALBUMIN: Prealbumin: <5 mg/dL — ABNORMAL LOW (ref 18–38)

## 2020-01-19 LAB — C-REACTIVE PROTEIN: CRP: 34 mg/dL — ABNORMAL HIGH (ref <1.0)

## 2020-01-19 LAB — PHOSPHORUS: Phosphorus: 3.5 mg/dL (ref 2.5–4.6)

## 2020-01-19 MED ORDER — SODIUM CHLORIDE 0.9% FLUSH
3.0000 mL | Freq: Two times a day (BID) | INTRAVENOUS | Status: DC
  Administered 2020-01-21 – 2020-01-24 (×7): 3 mL via INTRAVENOUS

## 2020-01-19 MED ORDER — DOCUSATE SODIUM 100 MG PO CAPS
100.0000 mg | ORAL_CAPSULE | Freq: Two times a day (BID) | ORAL | Status: DC
  Administered 2020-01-19 – 2020-01-24 (×11): 100 mg via ORAL
  Filled 2020-01-19 (×11): qty 1

## 2020-01-19 MED ORDER — THIAMINE HCL 100 MG PO TABS
100.0000 mg | ORAL_TABLET | Freq: Every day | ORAL | Status: DC
  Administered 2020-01-19 – 2020-01-24 (×6): 100 mg via ORAL
  Filled 2020-01-19 (×6): qty 1

## 2020-01-19 MED ORDER — ONDANSETRON HCL 4 MG/2ML IJ SOLN: 4.0000 mg | Freq: Four times a day (QID) | INTRAMUSCULAR | Status: DC | PRN

## 2020-01-19 MED ORDER — METRONIDAZOLE IN NACL 5-0.79 MG/ML-% IV SOLN
500.0000 mg | Freq: Three times a day (TID) | INTRAVENOUS | Status: DC
  Administered 2020-01-19 – 2020-01-20 (×2): 500 mg via INTRAVENOUS
  Filled 2020-01-19: qty 100

## 2020-01-19 MED ORDER — DILTIAZEM HCL ER COATED BEADS 240 MG PO CP24
240.0000 mg | ORAL_CAPSULE | Freq: Every day | ORAL | Status: DC
  Administered 2020-01-19 – 2020-01-24 (×5): 240 mg via ORAL
  Filled 2020-01-19 (×7): qty 1

## 2020-01-19 MED ORDER — SODIUM CHLORIDE 0.9 % IV SOLN: INTRAVENOUS | Status: AC

## 2020-01-19 MED ORDER — ALLOPURINOL 100 MG PO TABS
100.0000 mg | ORAL_TABLET | Freq: Every day | ORAL | Status: DC
  Administered 2020-01-19 – 2020-01-24 (×6): 100 mg via ORAL
  Filled 2020-01-19 (×7): qty 1

## 2020-01-19 MED ORDER — SODIUM CHLORIDE 0.9 % IV SOLN
2.0000 g | Freq: Three times a day (TID) | INTRAVENOUS | Status: DC
  Administered 2020-01-19 – 2020-01-20 (×3): 2 g via INTRAVENOUS
  Filled 2020-01-19 (×4): qty 2

## 2020-01-19 MED ORDER — ONDANSETRON HCL 4 MG PO TABS: 4.0000 mg | ORAL_TABLET | Freq: Four times a day (QID) | ORAL | Status: DC | PRN

## 2020-01-19 MED ORDER — ACETAMINOPHEN 325 MG PO TABS: 650.0000 mg | ORAL_TABLET | Freq: Four times a day (QID) | ORAL | Status: DC | PRN

## 2020-01-19 MED ORDER — BOOST / RESOURCE BREEZE PO LIQD CUSTOM
237.0000 mL | Freq: Three times a day (TID) | ORAL | Status: DC
  Administered 2020-01-19: 1 via ORAL
  Filled 2020-01-19 (×2): qty 1

## 2020-01-19 MED ORDER — HYDROCODONE-ACETAMINOPHEN 5-325 MG PO TABS: 1.0000 | ORAL_TABLET | ORAL | Status: DC | PRN

## 2020-01-19 MED ORDER — ENSURE ENLIVE PO LIQD
237.0000 mL | Freq: Two times a day (BID) | ORAL | Status: DC
  Administered 2020-01-19 – 2020-01-24 (×10): 237 mL via ORAL

## 2020-01-19 MED ORDER — VANCOMYCIN HCL 500 MG/100ML IV SOLN
500.0000 mg | Freq: Two times a day (BID) | INTRAVENOUS | Status: DC
  Administered 2020-01-19 – 2020-01-20 (×2): 500 mg via INTRAVENOUS
  Filled 2020-01-19 (×2): qty 100

## 2020-01-19 MED ORDER — GABAPENTIN 300 MG PO CAPS: 300.0000 mg | ORAL_CAPSULE | Freq: Two times a day (BID) | ORAL | Status: DC | PRN

## 2020-01-19 MED ORDER — ACETAMINOPHEN 650 MG RE SUPP: 650.0000 mg | Freq: Four times a day (QID) | RECTAL | Status: DC | PRN

## 2020-01-19 NOTE — Evaluation (Signed)
Physical Therapy Evaluation Patient Details Name: Shawn Hendrix MRN: [id number] DOB: 1951/04/20 Today's Date: 01/19/2020   History of Present Illness  69 yo male admitted with weakness, L UE/knee pain, SIRS, acute gouty arthropathy. Hx of gout, OA, FTT, Sz, ETOH use, prostate cancer, L knee meniscal tear  Clinical Impression  On eval, pt required Mod assist +2 for bed mobility. He sat EOB for ~8 minutes with Supv level assist. Pt was unable to attempt any OOB activity on today 2* pain. Will plan to follow and progress activity as able. At this time, recommendation is for ST SNF.     Follow Up Recommendations SNF    Equipment Recommendations  None recommended by PT    Recommendations for Other Services       Precautions / Restrictions Precautions Precautions: Fall Restrictions Weight Bearing Restrictions: No      Mobility  Bed Mobility Overal bed mobility: Needs Assistance Bed Mobility: Supine to Sit;Sit to Supine     Supine to sit: Mod assist;+2 for physical assistance;+2 for safety/equipment;HOB elevated Sit to supine: Mod assist;+2 for physical assistance;HOB elevated;+2 for safety/equipment   General bed mobility comments: Assist for trunk and bil LEs. Utilized bedpad for scooting, positioning. Increased time.  Transfers                 General transfer comment: NT-pt unable on today 2* pain  Ambulation/Gait                Stairs            Wheelchair Mobility    Modified Rankin (Stroke Patients Only)       Balance Overall balance assessment: Needs assistance   Sitting balance-Leahy Scale: Good                                       Pertinent Vitals/Pain Pain Assessment: 0-10 Pain Score: 10-Worst pain ever Pain Location: L knee, L UE Pain Descriptors / Indicators: Discomfort;Aching;Sore;Tender Pain Intervention(s): Limited activity within patient's tolerance;Monitored during session;Repositioned    Home Living  Family/patient expects to be discharged to:: Unsure Living Arrangements: Spouse/significant other Available Help at Discharge: Family Type of Home: Apartment Home Access: Ramped entrance     Home Layout: One level Home Equipment: Cane - single point;Walker - 2 wheels;Wheelchair - manual      Prior Function Level of Independence: Needs assistance   Gait / Transfers Assistance Needed: uses RW vs cane.  ADL's / Homemaking Assistance Needed: girlfriend assists with bathing, dressing before she leaves for work        Hand Dominance        Extremity/Trunk Assessment   Upper Extremity Assessment Upper Extremity Assessment: Defer to OT evaluation    Lower Extremity Assessment Lower Extremity Assessment: LLE deficits/detail LLE Deficits / Details: Limited knee flex/ext ROM: ~15-75 degrees LLE: Unable to fully assess due to pain    Cervical / Trunk Assessment Cervical / Trunk Assessment: Kyphotic  Communication   Communication: No difficulties  Cognition Arousal/Alertness: Awake/alert Behavior During Therapy: WFL for tasks assessed/performed Overall Cognitive Status: Within Functional Limits for tasks assessed                                        General Comments      Exercises General Exercises - Lower  Extremity Long Arc Quad: AROM;Right;5 reps;Seated   Assessment/Plan    PT Assessment Patient needs continued PT services  PT Problem List Decreased strength;Decreased mobility;Decreased range of motion;Decreased activity tolerance;Decreased balance;Decreased knowledge of use of DME;Pain       PT Treatment Interventions DME instruction;Gait training;Therapeutic activities;Therapeutic exercise;Patient/family education;Balance training;Functional mobility training    PT Goals (Current goals can be found in the Care Plan section)  Acute Rehab PT Goals Patient Stated Goal: less pain PT Goal Formulation: With patient Time For Goal Achievement:  02/02/20 Potential to Achieve Goals: Fair    Frequency Min 3X/week   Barriers to discharge        Co-evaluation               AM-PAC PT "6 Clicks" Mobility  Outcome Measure Help needed turning from your back to your side while in a flat bed without using bedrails?: A Lot Help needed moving from lying on your back to sitting on the side of a flat bed without using bedrails?: A Lot Help needed moving to and from a bed to a chair (including a wheelchair)?: Total Help needed standing up from a chair using your arms (e.g., wheelchair or bedside chair)?: Total Help needed to walk in hospital room?: Total Help needed climbing 3-5 steps with a railing? : Total 6 Click Score: 8    End of Session   Activity Tolerance: Patient limited by pain Patient left: in bed;with call bell/phone within reach;with bed alarm set   PT Visit Diagnosis: Muscle weakness (generalized) (M62.81);Pain;Other abnormalities of gait and mobility (R26.89) Pain - Right/Left: Left Pain - part of body: Arm;Knee    Time: 1213-1229 PT Time Calculation (min) (ACUTE ONLY): 16 min   Charges:   PT Evaluation $PT Eval Low Complexity: 1 Low             Apple Dearmas P, PT Acute Rehabilitation

## 2020-01-19 NOTE — Progress Notes (Signed)
PROGRESS NOTE  Shawn Hendrix KCL:[id number] DOB: 1951-06-14 DOA: 01/18/2020 PCP: Javier Docker, MD   LOS: 0 days   Brief narrative: As per HPI,  Shawn Hendrix is a 69 y.o. male with medical history significant of history of alcohol use disorder, hypertension, vitamin B-12 deficiency, anemia, gout, prostate cancer, malnutrition alcohol withdrawal, seizures, history of failure to thrive  presented to the hospital with complaints of generalized weakness from home. He was found in the bed soaked in urine has been down for the past few days.  Patient states he spent about 3 weeks at the nursing home but then was discharged back to home to care of his girlfriend.  His left knee and left wrist has been bothering him and that only started after he was exposed to cold air.Recently was admitted for presumed sepsis secondary to colitis in end of April 2021 was discharged to SNF. Completed full course of treatment. He had generalized fatigue and had extensive work-up done including MRI brain MRI left knee and aspiration that was consistent with gout. Had an echogram done that showed preserved EF.  Assessment/Plan:  Active Problems:   Anemia   Malnutrition of moderate degree   Acute gouty arthropathy   Hypomagnesemia   Essential hypertension   SIRS (systemic inflammatory response syndrome) (HCC)   Alcohol abuse, in remission    Generalized weakness fatigue .  Will get physical therapy evaluation.  Was recently at the skilled nursing facility.  Will need repeat evaluation.  Systemic inflammatory response syndrome.  Patient met SIRS criteria with elevated white blood cell count tachycardia and fever.  He had T-max of 101.5 Fahrenheit.  Mild leukocytosis at 13,000.  Cultures negative in less than 24 hours.  We will fluid culture was sent on 01/18/2020 showed no organisms..  CRP was elevated at 34 including sed rate at 143.  Procalcitonin 1.4.  Magnesium of 1.4.  [place] fluid count showed  WBC of 47,000.  Intracellular monosodium urate crystals were found.  Urine analysis was negative for infection.  COVID-19 test was negative.  ~1.5.  Currently on vancomycin and.  Will continue for now.  Anemia -ferritin elevated but total iron low at 10 likely iron deficiency.  Vitamin B12 was 717.  Will monitor CBC.  Check fecal occult blood.  Malnutrition of moderate degree -nutritional evaluation.  Acute gouty arthropathy -joint aspiration shows monosodium urate crystals.  No evidence of infection.  Gout can cause low-grade fever as well.  On colchicine.  We will continue with that.  Hold off with steroids for now. Recent MRI of left knee as well which did not completely rule out osteo but given fluid showed no evidence of infectious process and cultures were negative most likely noninfectious process. Treat with colchicine 0.6 TID for the first day and decrease to BID the next day.  Hyponatremia.  Sodium of 131.  Received IV fluid hydration.  Will closely monitor with BMP.   Essential hypertension -on Cardizem and lisinopril at home.  Hypomagnesemia -magnesium 1.4 on presentation.  Improved to 1.7.  Magnesium levels in a.m.  Alcohol abuse, in remission -monitor for any sign of withdrawal.  Patient has not had a drink in the last week or so.  Will closely monitor during hospitalization.   VTE Prophylaxis: SCD  Code Status: Full code  Family Communication: None  Status is: Observation  The patient will require care spanning > 2 midnights and should be moved to inpatient because: Unsafe d/c plan, Inpatient level of care  appropriate due to severity of illness and Fever and SIRS on IV antibiotic.,  PT evaluation  Dispo: The patient is from: Home              Anticipated d/c is to: Home/SNF              Anticipated d/c date is: 2 days              Patient currently is not medically stable to d/c.  Will need PT evaluation and possible skilled nursing facility  placement  Consultants:  None  Procedures:  None  Antibiotics:  . Vancomycin and cefepime  Anti-infectives (From admission, onward)   Start     Dose/Rate Route Frequency Ordered Stop   01/19/20 1400  ceFEPIme (MAXIPIME) 2 g in sodium chloride 0.9 % 100 mL IVPB     2 g 200 mL/hr over 30 Minutes Intravenous Every 8 hours 01/19/20 0958     01/19/20 0200  vancomycin (VANCOREADY) IVPB 500 mg/100 mL     500 mg 100 mL/hr over 60 Minutes Intravenous Every 12 hours 01/18/20 2119     01/19/20 0100  ceFEPIme (MAXIPIME) 2 g in sodium chloride 0.9 % 100 mL IVPB  Status:  Discontinued     2 g 200 mL/hr over 30 Minutes Intravenous Every 12 hours 01/18/20 2119 01/19/20 0958   01/18/20 2200  metroNIDAZOLE (FLAGYL) IVPB 500 mg     500 mg 100 mL/hr over 60 Minutes Intravenous Every 8 hours 01/18/20 2113     01/18/20 1300  ceFEPIme (MAXIPIME) 2 g in sodium chloride 0.9 % 100 mL IVPB     2 g 200 mL/hr over 30 Minutes Intravenous  Once 01/18/20 1249 01/18/20 1346   01/18/20 1300  metroNIDAZOLE (FLAGYL) IVPB 500 mg     500 mg 100 mL/hr over 60 Minutes Intravenous  Once 01/18/20 1249 01/18/20 1358   01/18/20 1300  vancomycin (VANCOCIN) IVPB 1000 mg/200 mL premix     1,000 mg 200 mL/hr over 60 Minutes Intravenous  Once 01/18/20 1249 01/18/20 1512     Subjective: Today, patient was seen and examined at bedside.  Patient complains of generalized weakness.  Denies any nausea, vomiting abdominal pain.    Objective: Vitals:   01/19/20 1034 01/19/20 1211  BP: (!) 151/91 116/80  Pulse: 99 98  Resp: 16 18  Temp: 99.3 F (37.4 C) 100 F (37.8 C)  SpO2: 96% 98%    Intake/Output Summary (Last 24 hours) at 01/19/2020 1320 Last data filed at 01/19/2020 0719 Gross per 24 hour  Intake 3114.97 ml  Output --  Net 3114.97 ml   Filed Weights   01/18/20 1238  Weight: 54 kg   Body mass index is 23.25 kg/m.   Physical Exam:  GENERAL: Patient is alert awake and oriented. Not in obvious distress.   Thinly built HENT: No scleral pallor or icterus. Oral mucosa is mildly dry.  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 CHEST: Clear to auscultation. No crackles or wheezes.  Diminished breath sounds bilaterally. CVS: S1 and S2 heard, no murmur. Regular rate and rhythm.  ABDOMEN: Soft, non-tender, bowel sounds are present. EXTREMITIES: No edema. CNS: Cranial nerves are intact. No focal motor deficits.  Generalized weakness noted SKIN: warm and dry without rashes.  Data Review: I have personally reviewed the following laboratory data and studies,  CBC: Recent Labs  Lab 01/18/20 1258 01/19/20 0809  WBC 12.6* 13.0*  NEUTROABS 10.7* 11.2*  HGB 8.7* 8.2*  HCT 26.4* 25.2*  MCV 94.0 93.7  PLT 284 419   Basic Metabolic Panel: Recent Labs  Lab 01/18/20 1258 01/18/20 2111 01/19/20 0809  NA 130*  --  131*  K 4.2  --  3.7  CL 92*  --  97*  CO2 25  --  21*  GLUCOSE 141*  --  95  BUN 19  --  14  CREATININE 0.90  --  0.78  CALCIUM 9.0  --  8.1*  MG  --  1.4* 1.7  PHOS  --  2.8 3.5   Liver Function Tests: Recent Labs  Lab 01/18/20 1258 01/19/20 0809  AST 15 13*  ALT 11 11  ALKPHOS 49 44  BILITOT 1.5* 1.5*  PROT 7.5 6.4*  ALBUMIN 3.2* 2.6*   No results for input(s): LIPASE, AMYLASE in the last 168 hours. No results for input(s): AMMONIA in the last 168 hours. Cardiac Enzymes: Recent Labs  Lab 01/18/20 1258  CKTOTAL 19*   BNP (last 3 results) No results for input(s): BNP in the last 8760 hours.  ProBNP (last 3 results) No results for input(s): PROBNP in the last 8760 hours.  CBG: No results for input(s): GLUCAP in the last 168 hours. Recent Results (from the past 240 hour(s))  Blood Culture (routine x 2)     Status: None (Preliminary result)   Collection Time: 01/18/20 12:49 PM   Specimen: BLOOD RIGHT FOREARM  Result Value Ref Range Status   Specimen Description   Final    BLOOD RIGHT FOREARM Performed at [facility] [address], [place], [place] [address]    Special Requests   Final    BOTTLES DRAWN AEROBIC AND ANAEROBIC Blood Culture adequate volume Performed at [facility] [address] [place], [place] [address]    Culture   Final    NO GROWTH < 24 HOURS Performed at [facility] [address], [place], [place] [address]    Report Status PENDING  Incomplete  SARS Coronavirus 2 by RT PCR (hospital order, performed in [facility] hospital lab) Nasopharyngeal Nasopharyngeal Swab     Status: None   Collection Time: 01/18/20 12:50 PM   Specimen: Nasopharyngeal Swab  Result Value Ref Range Status   SARS Coronavirus 2 NEGATIVE NEGATIVE Final    Comment: (NOTE) SARS-CoV-2 target nucleic acids are NOT DETECTED. The SARS-CoV-2 RNA is generally detectable in upper and lower respiratory specimens during the acute phase of infection. The lowest concentration of SARS-CoV-2 viral copies this assay can detect is 250 copies / mL. A negative result does not preclude SARS-CoV-2 infection and should not be used as the sole basis for treatment or other patient management decisions.  A negative result may occur with improper specimen collection / handling, submission of specimen other than nasopharyngeal swab, presence of viral mutation(s) within the areas targeted by this assay, and inadequate number of viral copies (<250 copies / mL). A negative result must be combined with clinical observations, patient history, and epidemiological information. Fact Sheet for Patients:   StrictlyIdeas.no Fact Sheet for Healthcare Providers: BankingDealers.co.za This test is not yet approved or cleared  by the [place] FDA and has been authorized for detection and/or diagnosis of SARS-CoV-2 by FDA under an Emergency Use Authorization (EUA).  This EUA will remain in effect (meaning this test can be used) for the duration  of the COVID-19 declaration under Section 564(b)(1) of the Act, 21 U.S.C. section 360bbb-3(b)(1),  unless the authorization is terminated or revoked sooner. Performed at [facility], [place] [address], [place], [place] [address]   Blood Culture (routine x 2)     Status: None (Preliminary result)   Collection Time: 01/18/20 12:54 PM   Specimen: BLOOD LEFT FOREARM  Result Value Ref Range Status   Specimen Description   Final    BLOOD LEFT FOREARM Performed at [facility] [address], [place], [place] [address]    Special Requests   Final    BOTTLES DRAWN AEROBIC ONLY Blood Culture adequate volume Performed at [facility] [address] [place], [place] [address]    Culture   Final    NO GROWTH < 24 HOURS Performed at [facility] [address], [place], [place] [address]    Report Status PENDING  Incomplete  Body fluid culture     Status: None (Preliminary result)   Collection Time: 01/18/20  5:06 PM   Specimen: Synovium; Body Fluid  Result Value Ref Range Status   Specimen Description   Final    SYNOVIAL KNEE Performed at [facility] [address] [place], [place] [address]    Special Requests   Final    NONE Performed at [facility], [place] [address], [place], [place] [address]    Gram Stain   Final    WBC PRESENT,BOTH PMN AND MONONUCLEAR NO ORGANISMS SEEN Gram Stain Report Called to,Read Back By and Verified With: GARRISON,G. RN @1850   01/18/20 BILLINGSLEY,L Performed at [facility], [place] [address], [place], [place] [address]    Culture   Final    NO GROWTH < 12 HOURS Performed at [facility] [address], [place], [place] [address]    Report Status PENDING  Incomplete     Studies: DG Wrist Complete Left  Result Date: 01/18/2020 CLINICAL DATA:  Bilateral wrist pain. The patient reports a history of gout. EXAM: LEFT WRIST -  COMPLETE 3+ VIEW COMPARISON:  Plain films of the left hand 11/28/2019. FINDINGS: No acute bony or joint abnormality. The proximal pole the scaphoid is severely diminutive and thinned. Severe proximal migration of the capitate is present. There is a large defect in the articular surface of the distal radius and adjacent capitate. Erosive change is also seen at the distal ulna. 2 loose osseous fragments project in the posterior aspect of the radiocarpal joint measuring up to approximately 1 cm in diameter. Soft tissues about the wrist are severely swollen. No chondrocalcinosis of the triangular fibrocartilage. No acute bony abnormality. Visualized MCP joints appear normal. IMPRESSION: No acute finding. Severe SLAC wrist. Cause for this finding is not definitely identified but may be due to remote avascular necrosis the scaphoid, trauma or arthropathy such as gout. The proximal pole the scaphoid is largely absent. Erosive change in the ulnar styloid and soft tissue swelling about the wrist are nonspecific but could be due to crystal or inflammatory arthropathy. Electronically Signed   By: Inge Rise M.D.   On: 01/18/2020 14:46   DG Wrist Complete Right  Result Date: 01/18/2020 CLINICAL DATA:  BILATERAL wrist and knee pain, history of gout, prostate cancer, hypertension, arthritis EXAM: RIGHT WRIST - COMPLETE 3+ VIEW COMPARISON:  None FINDINGS: Osseous demineralization. Larger erosion at the radial margin of the mid scaphoid. Mild scattered chondrocalcinosis. Joint space narrowing radiocarpal joint. Widening of scapholunate interval consistent with torn scapholunate ligament. No acute fracture, dislocation, or bone  destruction. Significant soft tissue swelling at dorsum of hand overlying the CMC joints and proximal metacarpals. IMPRESSION: Scattered degenerative changes and chondrocalcinosis question CPPD. Torn scapholunate ligament. Erosion at mid scaphoid highly suggestive of gout. Electronically Signed   By:  Lavonia Dana M.D.   On: 01/18/2020 14:31   DG Chest Port 1 View  Result Date: 01/18/2020 CLINICAL DATA:  Fever EXAM: PORTABLE CHEST 1 VIEW COMPARISON:  11/28/2019 FINDINGS: The heart size and mediastinal contours are stable. Atherosclerotic calcification of the aortic knob. No focal airspace consolidation, pleural effusion, or pneumothorax. Severe degenerative changes of the bilateral shoulders. IMPRESSION: No active disease. Electronically Signed   By: Davina Poke D.O.   On: 01/18/2020 13:29   DG Knee Complete 4 Views Left  Result Date: 01/18/2020 CLINICAL DATA:  LEFT knee pain, history gout EXAM: LEFT KNEE - COMPLETE 4+ VIEW COMPARISON:  None FINDINGS: Osseous demineralization. Scattered degenerative changes with joint space narrowing greatest at medial compartment. Spurring and fragmentation at inferior margin of patella again identified. Overlying anterior soft tissue swelling of the LEFT knee. Small associated LEFT knee joint effusion. No acute fracture, dislocation or bone destruction. Old healed proximal fibular metadiaphyseal fracture. Extensive small vessel vascular disease changes at knee and in runoff vessels. IMPRESSION: Degenerative changes of the LEFT knee with irregularity, spurring and fragmentation at the inferior pole of the patella, could represent a posttraumatic or inflammatory etiology, associated with persistent overlying soft tissue swelling and small joint effusion. Osteomyelitis not completely excluded and if there is clinical evidence of infection, recommend joint aspiration. These results will be called to the ordering clinician or representative by the Radiologist Assistant, and communication documented in the PACS or [employer]. Electronically Signed   By: Lavonia Dana M.D.   On: 01/18/2020 14:38      Flora Lipps, MD  [facility] 01/19/2020

## 2020-01-19 NOTE — Progress Notes (Signed)
Initial Nutrition Assessment  DOCUMENTATION CODES:   Non-severe (moderate) malnutrition in context of chronic illness  INTERVENTION:   -Ensure Enlive po BID, each supplement provides 350 kcal and 20 grams of protein -Encouraged PO intakes  NUTRITION DIAGNOSIS:   Moderate Malnutrition related to chronic illness(ETOH abuse) as evidenced by moderate fat depletion, moderate muscle depletion.  GOAL:   Patient will meet greater than or equal to 90% of their needs  MONITOR:   PO intake, Supplement acceptance, Labs, Weight trends, I & O's  REASON FOR ASSESSMENT:   Consult Assessment of nutrition requirement/status  ASSESSMENT:   69 y.o. male with medical history significant of history of alcohol abuse hypertension vitamin B-12 deficiency, anemia gout prostate cancer malnutrition alcohol withdrawal seizures history of failure to thrive  Patient in room, reports his appetite fluctuates and he relies on his girlfriend to prepare meals for him. States he will have oatmeal and eggs for breakfast typically. Lunch and dinner depend on if she makes a meal. Pt states he drinks Ensure at home. Will order Ensure for patient this admission. Pt states he ate in the ED but has not ordered a meal yet since arriving on the floor. Pt with Boost Breeze supplement at bedside, has not tried yet. Main complaint is pain in his knee.  Pt reports weight loss but unable to quantify. Per weight records, pt's weight has remained stable.   I/Os: +3.1L since admit  Medications: colace, Thiamine Labs reviewed: Low Na Mg/Phos WNL  NUTRITION - FOCUSED PHYSICAL EXAM:    Most Recent Value  Orbital Region  No depletion  Upper Arm Region  Moderate depletion  Thoracic and Lumbar Region  Unable to assess  Buccal Region  Mild depletion  Temple Region  Severe depletion  Clavicle Bone Region  Moderate depletion  Clavicle and Acromion Bone Region  Moderate depletion  Scapular Bone Region  Moderate depletion   Dorsal Hand  Mild depletion  Patellar Region  Unable to assess  Anterior Thigh Region  Unable to assess  Posterior Calf Region  Unable to assess  Edema (RD Assessment)  None  Hair  Reviewed  Eyes  Reviewed  Mouth  Reviewed [poor dentition]  Skin  Reviewed  Nails  Unable to assess       Diet Order:   Diet Order            Diet Heart Room service appropriate? Yes; Fluid consistency: Thin  Diet effective now              EDUCATION NEEDS:   No education needs have been identified at this time  Skin:  Skin Assessment: Reviewed RN Assessment  Last BM:  PTA  Height:   Ht Readings from Last 1 Encounters:  01/18/20 5' (1.524 m)    Weight:   Wt Readings from Last 1 Encounters:  01/18/20 54 kg   BMI:  Body mass index is 23.25 kg/m.  Estimated Nutritional Needs:   Kcal:  1650-1850  Protein:  75-85g  Fluid:  1.8L/day  Clayton Bibles, MS, RD, LDN Inpatient Clinical Dietitian Contact information available via Amion

## 2020-01-19 NOTE — Progress Notes (Signed)
Flagyl IV given after Maxipime finished. Unable to document admin of flagyl in Epic due to order being locked elsewhere in hospital.

## 2020-01-19 NOTE — Progress Notes (Signed)
Flagyl given IV at 2215. Unable to document on MAR.

## 2020-01-19 NOTE — Progress Notes (Signed)
Pharmacy Antibiotic Note  Shawn Hendrix is a 69 y.o. male admitted on 01/18/2020 with sepsis.  Pharmacy has been consulted for vancomycin + cefepime dosing.  Assessment: -WBC 13 -SCr 0.8, CrCl ~61 mL/min -Lactate 1.5 -Tmax 101.5 F  This is day #2 of IV antibiotics.  Plan:  Increase cefepime to 2 g IV q8h for CrCl > 60 mL/min  Continue vancomycin 500 mg IV q12h  Goal VT 15-20 mcg/mL.   Follow renal function and culture data  Height: 5' (152.4 cm) Weight: 54 kg (119 lb 0.8 oz) IBW/kg (Calculated) : 50  Temp (24hrs), Avg:100.3 F (37.9 C), Min:98.8 F (37.1 C), Max:101.5 F (38.6 C)  Recent Labs  Lab 01/18/20 1258 01/19/20 0809  WBC 12.6* 13.0*  CREATININE 0.90 0.78  LATICACIDVEN 1.5  --     Estimated Creatinine Clearance: 61.6 mL/min (by C-G formula based on SCr of 0.78 mg/dL).    No Known Allergies  Antimicrobials this admission:  cefepime 6/1 >>  vancomycin 6/1 >>  Metronidazole 6/1 >>  Dose adjustments this admission:   Microbiology results:  6/1 BCx: ngtd 6/1 synovial knee: ngtd   Thank you for allowing pharmacy to be a part of this patient's care.  Lenis Noon, PharmD 01/19/2020 9:59 AM

## 2020-01-19 NOTE — NC FL2 (Signed)
[place] LEVEL OF CARE SCREENING TOOL     IDENTIFICATION  Patient Name: Shawn Hendrix Birthdate: 26-Jun-1951 Sex: male Admission Date (Current Location): 01/18/2020  [place] and [place] Number:  Kathleen Argue [id number] [place] and Address:  [facility],  [place] [address] [place]      Provider Number: [id number]  Attending Physician Name and Address:  Flora Lipps, MD  Relative Name and Phone Number:  Judd Gaudier, [phone]    Current Level of Care: Hospital Recommended Level of Care: [place] Prior Approval Number:    Date Approved/Denied:   PASRR Number: [id number]  Discharge Plan: SNF    Current Diagnoses: Patient Active Problem List   Diagnosis Date Noted  . SIRS (systemic inflammatory response syndrome) ([place]) 01/18/2020  . Alcohol abuse, in remission 01/18/2020  . Vitamin B12 deficiency 12/07/2019  . Pain   . Acute left-sided weakness   . Acute gouty arthropathy   . Sepsis ([place])   . Colitis   . Folate deficiency   . Alcohol abuse   . Hypomagnesemia   . Essential hypertension   . Generalized weakness 11/28/2019  . Malnutrition of moderate degree 12/05/2016  . Seizure ([place]) 12/02/2016  . Alcohol withdrawal seizure, uncomplicated ([place]) [id number]  . Anemia 12/02/2016  . Thrombocytopenia ([place]) 12/02/2016  . Hypokalemia 12/02/2016  . Malignant neoplasm of prostate ([place]) 10/31/2014    Orientation RESPIRATION BLADDER Height & Weight     Self, Place  Normal External catheter Weight: 54 kg Height:  5' (152.4 cm)  BEHAVIORAL SYMPTOMS/MOOD NEUROLOGICAL BOWEL NUTRITION STATUS  (none) (none) Incontinent Diet(see d/c summary)  AMBULATORY STATUS COMMUNICATION OF NEEDS Skin   Extensive Assist Verbally Normal                       Personal Care Assistance Level of Assistance  Bathing, Feeding, Dressing Bathing Assistance: Maximum assistance Feeding assistance: Independent Dressing  Assistance: Limited assistance     Functional Limitations Info  Sight, Hearing, Speech Sight Info: Adequate Hearing Info: Adequate Speech Info: Adequate    SPECIAL CARE FACTORS FREQUENCY  PT (By licensed PT), OT (By licensed OT)     PT Frequency: 5X/W OT Frequency: 5X/W            Contractures Contractures Info: Not present    Additional Factors Info  Code Status, Allergies Code Status Info: Full Allergies Info: NKA           Current Medications (01/19/2020):  This is the current hospital active medication list Current Facility-Administered Medications  Medication Dose Route Frequency Provider Last Rate Last Admin  . 0.9 %  sodium chloride infusion   Intravenous Continuous Toy Baker, MD 75 mL/hr at 01/19/20 0812 New Bag at 01/19/20 [id number]  . acetaminophen (TYLENOL) tablet 650 mg  650 mg Oral Q6H PRN Toy Baker, MD       Or  . acetaminophen (TYLENOL) suppository 650 mg  650 mg Rectal Q6H PRN Doutova, Anastassia, MD      . allopurinol (ZYLOPRIM) tablet 100 mg  100 mg Oral Daily Doutova, Anastassia, MD   100 mg at 01/19/20 1103  . ceFEPIme (MAXIPIME) 2 g in sodium chloride 0.9 % 100 mL IVPB  2 g Intravenous Q8H Lenis Noon, RPH 200 mL/hr at 01/19/20 1416 2 g at 01/19/20 1416  . colchicine tablet 0.6 mg  0.6 mg Oral TID Toy Baker, MD   0.6 mg at 01/19/20 1216  . diltiazem (CARDIZEM CD) 24 hr  capsule 240 mg  240 mg Oral Daily Doutova, Anastassia, MD   240 mg at 01/19/20 1047  . docusate sodium (COLACE) capsule 100 mg  100 mg Oral BID Toy Baker, MD   100 mg at 01/19/20 1047  . feeding supplement (ENSURE ENLIVE) (ENSURE ENLIVE) liquid 237 mL  237 mL Oral BID BM Pokhrel, Laxman, MD   237 mL at 01/19/20 1412  . gabapentin (NEURONTIN) capsule 300 mg  300 mg Oral BID PRN Toy Baker, MD      . HYDROcodone-acetaminophen (NORCO/VICODIN) 5-325 MG per tablet 1-2 tablet  1-2 tablet Oral Q4H PRN Doutova, Anastassia, MD      . metroNIDAZOLE  (FLAGYL) IVPB 500 mg  500 mg Intravenous Q8H Doutova, Anastassia, MD 100 mL/hr at 01/19/20 0722 500 mg at 01/19/20 0722  . ondansetron (ZOFRAN) tablet 4 mg  4 mg Oral Q6H PRN Toy Baker, MD       Or  . ondansetron (ZOFRAN) injection 4 mg  4 mg Intravenous Q6H PRN Doutova, Anastassia, MD      . sodium chloride flush (NS) 0.9 % injection 3 mL  3 mL Intravenous Q12H Doutova, Anastassia, MD      . thiamine tablet 100 mg  100 mg Oral Daily Doutova, Anastassia, MD   100 mg at 01/19/20 1047  . vancomycin (VANCOREADY) IVPB 500 mg/100 mL  500 mg Intravenous Q12H Toy Baker, MD   Stopped at 01/19/20 0719     Discharge Medications: Please see discharge summary for a list of discharge medications.  Relevant Imaging Results:  Relevant Lab Results:   Additional Information SSN [place], [place]

## 2020-01-19 NOTE — ED Notes (Signed)
Pt. Documented in error see above note in chart. 

## 2020-01-20 LAB — GLUCOSE, BODY FLUID OTHER: Glucose, Body Fluid Other: 12 mg/dL

## 2020-01-20 LAB — CBC
HCT: 24.7 % — ABNORMAL LOW (ref 39.0–52.0)
Hemoglobin: 7.9 g/dL — ABNORMAL LOW (ref 13.0–17.0)
MCH: 30 pg (ref 26.0–34.0)
MCHC: 32 g/dL (ref 30.0–36.0)
MCV: 93.9 fL (ref 80.0–100.0)
Platelets: 278 10*3/uL (ref 150–400)
RBC: 2.63 MIL/uL — ABNORMAL LOW (ref 4.22–5.81)
RDW: 16.4 % — ABNORMAL HIGH (ref 11.5–15.5)
WBC: 11.6 10*3/uL — ABNORMAL HIGH (ref 4.0–10.5)
nRBC: 0 % (ref 0.0–0.2)

## 2020-01-20 LAB — PROTEIN, BODY FLUID (OTHER): Total Protein, Body Fluid Other: 3.2 g/dL

## 2020-01-20 LAB — BASIC METABOLIC PANEL
Anion gap: 8 (ref 5–15)
BUN: 17 mg/dL (ref 8–23)
CO2: 22 mmol/L (ref 22–32)
Calcium: 8.3 mg/dL — ABNORMAL LOW (ref 8.9–10.3)
Chloride: 99 mmol/L (ref 98–111)
Creatinine, Ser: 0.79 mg/dL (ref 0.61–1.24)
GFR calc Af Amer: >60 mL/min (ref >60)
GFR calc non Af Amer: >60 mL/min (ref >60)
Glucose, Bld: 99 mg/dL (ref 70–99)
Potassium: 3.7 mmol/L (ref 3.5–5.1)
Sodium: 129 mmol/L — ABNORMAL LOW (ref 135–145)

## 2020-01-20 LAB — MAGNESIUM: Magnesium: 1.7 mg/dL (ref 1.7–2.4)

## 2020-01-20 LAB — URINE CULTURE: Culture: NO GROWTH

## 2020-01-20 MED ORDER — PANTOPRAZOLE SODIUM 40 MG PO TBEC
40.0000 mg | DELAYED_RELEASE_TABLET | Freq: Every day | ORAL | Status: DC
  Administered 2020-01-20 – 2020-01-24 (×5): 40 mg via ORAL
  Filled 2020-01-20 (×5): qty 1

## 2020-01-20 MED ORDER — TRAMADOL HCL 50 MG PO TABS: 50.0000 mg | ORAL_TABLET | Freq: Four times a day (QID) | ORAL | Status: DC | PRN

## 2020-01-20 MED ORDER — COLCHICINE 0.6 MG PO TABS
0.6000 mg | ORAL_TABLET | Freq: Two times a day (BID) | ORAL | Status: DC
  Administered 2020-01-20 – 2020-01-24 (×9): 0.6 mg via ORAL
  Filled 2020-01-20 (×9): qty 1

## 2020-01-20 MED ORDER — VITAMIN B-12 1000 MCG PO TABS
1000.0000 ug | ORAL_TABLET | Freq: Every day | ORAL | Status: DC
  Administered 2020-01-20 – 2020-01-24 (×5): 1000 ug via ORAL
  Filled 2020-01-20 (×5): qty 1

## 2020-01-20 MED ORDER — METHYLPREDNISOLONE SODIUM SUCC 40 MG IJ SOLR
40.0000 mg | Freq: Every day | INTRAMUSCULAR | Status: DC
  Administered 2020-01-20 – 2020-01-24 (×5): 40 mg via INTRAVENOUS
  Filled 2020-01-20 (×5): qty 1

## 2020-01-20 MED ORDER — FERROUS SULFATE 325 (65 FE) MG PO TABS
325.0000 mg | ORAL_TABLET | Freq: Every day | ORAL | Status: DC
  Administered 2020-01-21 – 2020-01-24 (×4): 325 mg via ORAL
  Filled 2020-01-20 (×4): qty 1

## 2020-01-20 MED ORDER — MAGNESIUM OXIDE 400 (241.3 MG) MG PO TABS
400.0000 mg | ORAL_TABLET | Freq: Two times a day (BID) | ORAL | Status: DC
  Administered 2020-01-20 – 2020-01-24 (×9): 400 mg via ORAL
  Filled 2020-01-20 (×9): qty 1

## 2020-01-20 MED ORDER — LISINOPRIL 20 MG PO TABS
20.0000 mg | ORAL_TABLET | Freq: Every day | ORAL | Status: DC
  Administered 2020-01-20 – 2020-01-24 (×4): 20 mg via ORAL
  Filled 2020-01-20 (×5): qty 1

## 2020-01-20 MED ORDER — FOLIC ACID 1 MG PO TABS
1.0000 mg | ORAL_TABLET | Freq: Every day | ORAL | Status: DC
  Administered 2020-01-20 – 2020-01-24 (×5): 1 mg via ORAL
  Filled 2020-01-20 (×5): qty 1

## 2020-01-20 NOTE — TOC Initial Note (Addendum)
Transition of Care [facility]) - Initial/Assessment Note    Patient Details  Name: Shawn Hendrix MRN: [id number] Date of Birth: [birth year]  [facility]) CM/SW Contact:    Trish Mage, LCSW Phone Number: 01/20/2020, 9:54 AM  Clinical Narrative:   Patient seen in follow up to PT recommendation of SNF.  Mr Wiess lives her in [place] by himself, and has support of girlfriend, sister, niece  and 2 brothers. DME includes a cane, walker, rollator and wheelchair.  While he does not say he had a negative experience at [facility], he also states he would prefer not to return there.  I reminded him that anywhere he goes would require a 30 day commitment, and he voiced understanding while also complaining about that. He states he was told by the therapist who had been assigned to him post d/c from [facility] that they were going to check into eligibility for PCA.     Left message for brother, Judd Gaudier, [phone], per patient request.  TOC will continue to follow during the course of hospitalization.  Addendum:  Heard back from brother who confirmed choice of [place].  Left message for Freda Munro to make sure she sees he is MCD, and 30 day requirement will be enforced.  Also, when she can admit and what time frame is for COVID test.           Expected Discharge Plan: [place] Barriers to Discharge: SNF Pending bed offer   Patient Goals and CMS Choice Patient states their goals for this hospitalization and ongoing recovery are:: "I'd rather go home, but you should talk to my bother Judd Gaudier."   Choice offered to / list presented to : Patient, Sibling  Expected Discharge Plan and Services Expected Discharge Plan: [place]   Discharge Planning Services: CM Consult   Living arrangements for the past 2 months: Apartment                                      Prior Living Arrangements/Services Living arrangements for the past 2 months:  Apartment Lives with:: Self Patient language and need for interpreter reviewed:: Yes        Need for Family Participation in Patient Care: Yes (Comment) Care giver support system in place?: Yes (comment) Current home services: DME, Home PT Criminal Activity/Legal Involvement Pertinent to Current Situation/Hospitalization: No - Comment as needed  Activities of Daily Living Home Assistive Devices/Equipment: Environmental consultant (specify type), Wheelchair, Radio producer (specify quad or straight), Shower chair with back, Bedside commode/3-in-1 ADL Screening (condition at time of admission) Patient's cognitive ability adequate to safely complete daily activities?: Yes Is the patient deaf or have difficulty hearing?: No Does the patient have difficulty seeing, even when wearing glasses/contacts?: 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) Does the patient have difficulty concentrating, remembering, or making decisions?: No Patient able to express need for assistance with ADLs?: Yes Does the patient have difficulty dressing or bathing?: Yes Independently performs ADLs?: No Communication: Independent Dressing (OT): Needs assistance Is this a change from baseline?: Pre-admission baseline Grooming: Independent Feeding: Independent Bathing: Needs assistance Is this a change from baseline?: Pre-admission baseline Toileting: Needs assistance Is this a change from baseline?: Pre-admission baseline In/Out Bed: Needs assistance Is this a change from baseline?: Pre-admission baseline Walks in Home: Needs assistance Is this a change from baseline?: Pre-admission baseline Does the patient have difficulty walking or climbing stairs?:  Yes Weakness of Legs: Both Weakness of Arms/Hands: Both  Permission Sought/Granted Permission sought to share information with : Family Supports Permission granted to share information with : Yes, Verbal Permission Granted  Share Information with NAME: Judd Gaudier     Permission granted to share info w  Relationship: brother  Permission granted to share info w Contact Information: [phone]  Emotional Assessment Appearance:: Appears stated age Attitude/Demeanor/Rapport: Engaged Affect (typically observed): Appropriate Orientation: : Oriented to Self, Oriented to Place Alcohol / Substance Use: Not Applicable Psych Involvement: No (comment)  Admission diagnosis:  SIRS (systemic inflammatory response syndrome) (HCC) [R65.10] Fever, unspecified fever cause [R50.9] Patient Active Problem List   Diagnosis Date Noted  . SIRS (systemic inflammatory response syndrome) ([place]) 01/18/2020  . Alcohol abuse, in remission 01/18/2020  . Vitamin B12 deficiency 12/07/2019  . Pain   . Acute left-sided weakness   . Acute gouty arthropathy   . Sepsis ([place])   . Colitis   . Folate deficiency   . Alcohol abuse   . Hypomagnesemia   . Essential hypertension   . Generalized weakness 11/28/2019  . Malnutrition of moderate degree 12/05/2016  . Seizure ([place]) 12/02/2016  . Alcohol withdrawal seizure, uncomplicated ([place]) [id number]  . Anemia 12/02/2016  . Thrombocytopenia ([place]) 12/02/2016  . Hypokalemia 12/02/2016  . Malignant neoplasm of prostate ([place]) 10/31/2014   PCP:  Javier Docker, MD Pharmacy:   [id number] - [place], [place] [address] Phone: [phone] Fax: [phone]     Social Determinants of Health (SDOH) Interventions    Readmission Risk Interventions No flowsheet data found.

## 2020-01-20 NOTE — Progress Notes (Signed)
Physical Therapy Treatment Patient Details Name: Shawn Hendrix MRN: [id number] DOB: 02/05/1951 Today's Date: 01/20/2020    History of Present Illness 69 yo male admitted with weakness, L UE/knee pain, SIRS, acute gouty arthropathy. Hx of gout, OA, FTT, Sz, ETOH use, prostate cancer, L knee meniscal tear    PT Comments    Patient making steady progress with acute therapy. Patient agreeable to mobilize with PT and advance transfer/gait training. Patient required mod assist for bed mobility and Mod +2 for transfers with RW from elevated EOB. Cues for safe management with walker, posture, and step width with gait. Mod assist to prevent LOB during gait as pt is very unsteady with NBOS and scissoring step pattern. Pt's HR remained stable throughout gait, max of 114 bpm. Acute PT will continue to progress patient as able.   Follow Up Recommendations  SNF     Equipment Recommendations  None recommended by PT    Recommendations for Other Services       Precautions / Restrictions Precautions Precautions: Fall Restrictions Weight Bearing Restrictions: No    Mobility  Bed Mobility Overal bed mobility: Needs Assistance Bed Mobility: Supine to Sit     Supine to sit: Mod assist;HOB elevated;Max assist     General bed mobility comments: cues for sequencing LE/UE movement. assist required to bring LE's off EOB and to initaite Lt UE reaching to bed rail. Mod toe Max assist to raise trunk upright to sit EOB.  Transfers Overall transfer level: Needs assistance Equipment used: Rolling walker (2 wheeled) Transfers: Sit to/from Stand Sit to Stand: Mod assist;+2 safety/equipment;+2 physical assistance;From elevated surface Stand pivot transfers: Mod assist       General transfer comment: Pt required mod assist +2 with bed elevated slightly to initaite power up. Assist to steady with rising. Pt with NBOS in standing and cues required to increase width of stance. pt with safe reach back to  chair to sit after gait.  Ambulation/Gait Ambulation/Gait assistance: Mod assist;+2 physical assistance;+2 safety/equipment Gait Distance (Feet): 25 Feet Assistive device: Rolling walker (2 wheeled) Gait Pattern/deviations: Step-through pattern;Decreased step length - right;Decreased step length - left;Decreased stride length;Scissoring;Narrow base of support;Trunk flexed Gait velocity: decreased   General Gait Details: pt HIGH FALL RISK. Mod Asisst +2 for gait with assist to steady and manage walker position. pt with scissoring step pattern and NBOS throughout. cues required to increase step width and improve posture.   Stairs      Wheelchair Mobility    Modified Rankin (Stroke Patients Only)       Balance Overall balance assessment: Needs assistance Sitting-balance support: Feet supported Sitting balance-Leahy Scale: Fair     Standing balance support: During functional activity;Bilateral upper extremity supported Standing balance-Leahy Scale: Poor Standing balance comment: heavily reliant on external support for stability             Cognition Arousal/Alertness: Awake/alert Behavior During Therapy: WFL for tasks assessed/performed Overall Cognitive Status: Within Functional Limits for tasks assessed                 Exercises      General Comments        Pertinent Vitals/Pain Pain Assessment: Faces Faces Pain Scale: Hurts little more Pain Location: (generalized, Lt LE, Lt shoulder) Pain Descriptors / Indicators: Discomfort;Aching;Sore;Tender Pain Intervention(s): Limited activity within patient's tolerance;Monitored during session;Repositioned    Home Living Family/patient expects to be discharged to:: Unsure Living Arrangements: Spouse/significant other Available Help at Discharge: Family Type of Home: [place]  Access: Ramped entrance   Home Layout: One level Home Equipment: Cane - single point;Walker - 2 wheels;Wheelchair - manual       Prior Function Level of Independence: Needs assistance  Gait / Transfers Assistance Needed: uses RW vs cane. ADL's / Homemaking Assistance Needed: Reports he can perform his ADLs but at times needs assistance. Girlfriend assists with donning shirt     PT Goals (current goals can now be found in the care plan section) Acute Rehab PT Goals Patient Stated Goal: patient wants to go home PT Goal Formulation: With patient Time For Goal Achievement: 02/02/20 Potential to Achieve Goals: Fair Progress towards PT goals: Progressing toward goals    Frequency    Min 3X/week      PT Plan Current plan remains appropriate       AM-PAC PT "6 Clicks" Mobility   Outcome Measure  Help needed turning from your back to your side while in a flat bed without using bedrails?: A Lot Help needed moving from lying on your back to sitting on the side of a flat bed without using bedrails?: A Lot Help needed moving to and from a bed to a chair (including a wheelchair)?: A Lot Help needed standing up from a chair using your arms (e.g., wheelchair or bedside chair)?: A Lot Help needed to walk in hospital room?: A Lot Help needed climbing 3-5 steps with a railing? : Total 6 Click Score: 11    End of Session Equipment Utilized During Treatment: Gait belt Activity Tolerance: Patient tolerated treatment well Patient left: in chair;with call bell/phone within reach;with chair alarm set Nurse Communication: Mobility status PT Visit Diagnosis: Muscle weakness (generalized) (M62.81);Pain;Other abnormalities of gait and mobility (R26.89)     Time: [id number] PT Time Calculation (min) (ACUTE ONLY): 17 min  Charges:  $Gait Training: 8-22 mins                     Verner Mould, DPT Physical Therapist with [facility] [phone]  01/20/2020 4:11 PM

## 2020-01-20 NOTE — Evaluation (Signed)
Occupational Therapy Evaluation Patient Details Name: Shawn Hendrix MRN: [id number] DOB: 06/22/1951 Today's Date: 01/20/2020    History of Present Illness 69 yo male admitted with weakness, L UE/knee pain, SIRS, acute gouty arthropathy. Hx of gout, OA, FTT, Sz, ETOH use, prostate cancer, L knee meniscal tear   Clinical Impression   Mr. Shawn Hendrix is a 69 year old man who presented to the hospital with weakness and complaints of joint pain. On evaluation patient demonstrated decreased ROM and strength in upper extremities, generalized weakness, decreased activity tolerance and complaints of pain resulting in decreased ability to perform baseline mobility and ADLs. Patient will benefit from skilled OT services to improve deficits and return to PLOF. Short term rehab at discharge recommended at this time.    Follow Up Recommendations  SNF    Equipment Recommendations  None recommended by OT    Recommendations for Other Services       Precautions / Restrictions Precautions Precautions: Fall Restrictions Weight Bearing Restrictions: No      Mobility Bed Mobility   Bed Mobility: Supine to Sit;Sit to Supine     Supine to sit: Mod assist     General bed mobility comments: Assist for trunk and LLE (mod assist total) to transfer to side of bed.  Transfers Overall transfer level: Needs assistance   Transfers: Sit to/from Stand;Stand Pivot Transfers Sit to Stand: Mod assist Stand pivot transfers: Mod assist            Balance     Sitting balance-Leahy Scale: Good                                     ADL either performed or assessed with clinical judgement   ADL Overall ADL's : Needs assistance/impaired Eating/Feeding: Set up;Sitting   Grooming: Set up;Wash/dry face;Sitting   Upper Body Bathing: Moderate assistance;Sitting   Lower Body Bathing: Maximal assistance;Sit to/from stand   Upper Body Dressing : Moderate assistance;Sitting    Lower Body Dressing: Total assistance   Toilet Transfer: Stand-pivot;Moderate assistance;BSC   Toileting- Clothing Manipulation and Hygiene: Moderate assistance;Sit to/from stand;Total assistance               Vision Baseline Vision/History: (Right eye removal)       Perception     Praxis      Pertinent Vitals/Pain Pain Assessment: Faces Faces Pain Scale: Hurts little more Pain Location: L knee Pain Descriptors / Indicators: Discomfort;Aching;Sore;Tender Pain Intervention(s): Limited activity within patient's tolerance     Hand Dominance     Extremity/Trunk Assessment Upper Extremity Assessment Upper Extremity Assessment: RUE deficits/detail;LUE deficits/detail RUE Deficits / Details: 2/5 shoulder ROM, grossly functional ROM in elbow, wrist and hand LUE Deficits / Details: 2/5 shoulder ROM, grossly functiona ROM in elbow, wrist and hand.           Communication Communication Communication: No difficulties   Cognition Arousal/Alertness: Awake/alert Behavior During Therapy: WFL for tasks assessed/performed Overall Cognitive Status: Within Functional Limits for tasks assessed                                     General Comments       Exercises     Shoulder Instructions      Home Living Family/patient expects to be discharged to:: Unsure Living Arrangements: Spouse/significant other Available Help at Discharge:  Family Type of Home: Apartment Home Access: Ramped entrance     Home Layout: One level               Home Equipment: Cane - single point;Walker - 2 wheels;Wheelchair - manual          Prior Functioning/Environment Level of Independence: Needs assistance  Gait / Transfers Assistance Needed: uses RW vs cane. ADL's / Homemaking Assistance Needed: Reports he can perform his ADLs but at times needs assistance. Girlfriend assists with donning shirt            OT Problem List: Decreased activity tolerance;Decreased  range of motion;Decreased strength;Pain      OT Treatment/Interventions: Self-care/ADL training;Therapeutic exercise;Patient/family education;Therapeutic activities;Balance training;DME and/or AE instruction    OT Goals(Current goals can be found in the care plan section) Acute Rehab OT Goals Patient Stated Goal: patient wants to go home OT Goal Formulation: With patient Time For Goal Achievement: 02/03/20 Potential to Achieve Goals: Fair  OT Frequency: Min 2X/week   Barriers to D/C:            Co-evaluation              AM-PAC OT "6 Clicks" Daily Activity     Outcome Measure Help from another person eating meals?: A Little Help from another person taking care of personal grooming?: A Little Help from another person toileting, which includes using toliet, bedpan, or urinal?: Total Help from another person bathing (including washing, rinsing, drying)?: A Lot Help from another person to put on and taking off regular upper body clothing?: A Lot Help from another person to put on and taking off regular lower body clothing?: A Lot 6 Click Score: 13   End of Session Equipment Utilized During Treatment: Gait belt  Activity Tolerance: Patient tolerated treatment well Patient left: in chair;with chair alarm set  OT Visit Diagnosis: Muscle weakness (generalized) (M62.81);Pain                Time: [id number] OT Time Calculation (min): 23 min Charges:  OT General Charges $OT Visit: 1 Visit OT Evaluation $OT Eval Moderate Complexity: 1 Mod  Meagan Spease, OTR/L [place]  Office [phone]   Lenward Chancellor 01/20/2020, 1:01 PM

## 2020-01-20 NOTE — Plan of Care (Signed)

## 2020-01-20 NOTE — Progress Notes (Signed)
PROGRESS NOTE  Shawn Hendrix FAO:[id number] DOB: Mar 23, 1951 DOA: 01/18/2020 PCP: Javier Docker, MD   LOS: 1 day   Brief narrative: As per HPI,  Shawn Hendrix is a 69 y.o. male with medical history significant of history of alcohol use disorder, hypertension, vitamin B-12 deficiency, anemia, gout, prostate cancer, malnutrition alcohol withdrawal, seizures, history of failure to thrive  presented to the hospital with complaints of generalized weakness from home. He was found in the bed soaked in urine has been down for the past few days.  Patient states he spent about 3 weeks at the nursing home but then was discharged back to home to care of his girlfriend.  His left knee and left wrist has been bothering him and that only started after he was exposed to cold air.Recently was admitted for presumed sepsis secondary to colitis in end of April 2021 was discharged to SNF. Completed full course of treatment. He had generalized fatigue and had extensive work-up done including MRI brain MRI left knee and aspiration that was consistent with gout. Had an 2D  echogram done that showed preserved EF.  Assessment/Plan:  Active Problems:   Anemia   Malnutrition of moderate degree   Acute gouty arthropathy   Hypomagnesemia   Essential hypertension   SIRS (systemic inflammatory response syndrome) (HCC)   Alcohol abuse, in remission   Generalized weakness, fatigue .  Patient was seen by physical therapy who recommended skilled nursing facility placement on discharge.  Transition of care notified.  Systemic inflammatory response syndrome.  Likely from acute gouty arthritis.  No other source of infection noted so far.  Patient met SIRS criteria with elevated white blood cell count tachycardia and fever.  He had T-max of 101.5 Fahrenheit.  Mild leukocytosis at 13,000.  Blood cultures negative so far.  Synovial fluid ulture was sent on 01/18/2020 showed no organisms.  CRP was elevated at 34 including  sed rate at 143.  Procalcitonin 1.4.  Magnesium of 1.4.  Synovial fluid count showed WBC of 47,000.  Intracellular monosodium urate crystals were found.  Urine analysis was negative for infection.  COVID-19 test was negative.  Will discontinue antibiotic today.  Discontinue antibiotics.  Acute gouty arthropathy -joint aspiration shows monosodium urate crystals.  No evidence of infection.  Gout can cause low-grade fever as well.  Will restart colchicine, start on IV Solu-Medrol daily. Recent MRI of left knee as well which did not completely rule out osteo but given fluid showed no evidence of infectious process and cultures were negative most likely noninfectious process.  Anemia -ferritin elevated but total iron low at 10 likely iron deficiency.  Vitamin B12 was 717.  Will monitor CBC.  Check fecal occult blood, pending.  Continue iron sulfate.  Malnutrition of moderate degree -nutritional evaluation stated.  Continue Ensure Enlive.  Hyponatremia.  Sodium of 131 today at 129.  We will close monitor.  Check BMP in a.m.   Essential hypertension -on Cardizem and lisinopril at home.  On Cardizem.  Will resume lisinopril as well.  Hypomagnesemia -improved.  Continue magnesium oxide  Alcohol use disorder in remission -monitor for any sign of withdrawal.  Patient has not had a drink in the last week or so.  Will closely monitor during hospitalization.  Continue thiamine folic acid.   VTE Prophylaxis: SCD  Code Status: Full code  Family Communication: None  Status is: Inpatient  The patient is inpatient because: Unsafe d/c plan, Inpatient level of care appropriate due to severity of  illness, generalized debility needing skilled nursing facility placement  Dispo: The patient is from: Home              Anticipated d/c is to: Skilled nursing facility              Anticipated d/c date is: 2 days              Patient currently is not medically stable to d/c.  Will try IV Solu-Medrol colchicine for  acute left knee pain.  Consultants:  None  Procedures:  Left knee joint aspiration  Antibiotics:  . Vancomycin and cefepime 6/1>6/3  Anti-infectives (From admission, onward)   Start     Dose/Rate Route Frequency Ordered Stop   01/19/20 2245  metroNIDAZOLE (FLAGYL) IVPB 500 mg     500 mg 100 mL/hr over 60 Minutes Intravenous Every 8 hours 01/19/20 2230     01/19/20 1830  vancomycin (VANCOREADY) IVPB 500 mg/100 mL     500 mg 100 mL/hr over 60 Minutes Intravenous Every 12 hours 01/19/20 1814     01/19/20 1400  ceFEPIme (MAXIPIME) 2 g in sodium chloride 0.9 % 100 mL IVPB     2 g 200 mL/hr over 30 Minutes Intravenous Every 8 hours 01/19/20 0958     01/19/20 0200  vancomycin (VANCOREADY) IVPB 500 mg/100 mL  Status:  Discontinued     500 mg 100 mL/hr over 60 Minutes Intravenous Every 12 hours 01/18/20 2119 01/19/20 1814   01/19/20 0100  ceFEPIme (MAXIPIME) 2 g in sodium chloride 0.9 % 100 mL IVPB  Status:  Discontinued     2 g 200 mL/hr over 30 Minutes Intravenous Every 12 hours 01/18/20 2119 01/19/20 0958   01/18/20 2200  metroNIDAZOLE (FLAGYL) IVPB 500 mg  Status:  Discontinued     500 mg 100 mL/hr over 60 Minutes Intravenous Every 8 hours 01/18/20 2113 01/19/20 2229   01/18/20 1300  ceFEPIme (MAXIPIME) 2 g in sodium chloride 0.9 % 100 mL IVPB     2 g 200 mL/hr over 30 Minutes Intravenous  Once 01/18/20 1249 01/18/20 1346   01/18/20 1300  metroNIDAZOLE (FLAGYL) IVPB 500 mg     500 mg 100 mL/hr over 60 Minutes Intravenous  Once 01/18/20 1249 01/18/20 1358   01/18/20 1300  vancomycin (VANCOCIN) IVPB 1000 mg/200 mL premix     1,000 mg 200 mL/hr over 60 Minutes Intravenous  Once 01/18/20 1249 01/18/20 1512     Subjective: Today, patient was seen and examined at bedside.  Complains of generalized weakness left knee pain and difficulty ambulating.  Denies nausea vomiting or abdominal pain.  Denies withdrawal symptoms.  Nursing staff reported CIWA of 0.  Objective: Vitals:    01/20/20 0012 01/20/20 0425  BP: 110/68 116/66  Pulse: 85 80  Resp: 16 16  Temp: 98.4 F (36.9 C) 98.3 F (36.8 C)  SpO2: 100% 100%    Intake/Output Summary (Last 24 hours) at 01/20/2020 1139 Last data filed at 01/20/2020 1000 Gross per 24 hour  Intake 237 ml  Output 450 ml  Net -213 ml   Filed Weights   01/18/20 1238  Weight: 54 kg   Body mass index is 23.25 kg/m.   Physical Exam:  GENERAL: Patient is alert awake and oriented. Not in obvious distress.  Thinly built HENT: No scleral pallor or icterus. Oral mucosa is mildly dry.  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 CHEST: Clear to auscultation. No crackles or  wheezes.  Diminished breath sounds bilaterally. CVS: S1 and S2 heard, no murmur. Regular rate and rhythm.  ABDOMEN: Soft, non-tender, bowel sounds are present. EXTREMITIES: No edema.  Left knee with tenderness on palpation with mild effusion and restricted range of movement. CNS: Cranial nerves are intact. No focal motor deficits.  Generalized weakness noted SKIN: warm and dry without rashes.  Data Review: I have personally reviewed the following laboratory data and studies,  CBC: Recent Labs  Lab 01/18/20 1258 01/19/20 0809 01/20/20 0543  WBC 12.6* 13.0* 11.6*  NEUTROABS 10.7* 11.2*  --   HGB 8.7* 8.2* 7.9*  HCT 26.4* 25.2* 24.7*  MCV 94.0 93.7 93.9  PLT 284 252 829   Basic Metabolic Panel: Recent Labs  Lab 01/18/20 1258 01/18/20 2111 01/19/20 0809 01/20/20 0543  NA 130*  --  131* 129*  K 4.2  --  3.7 3.7  CL 92*  --  97* 99  CO2 25  --  21* 22  GLUCOSE 141*  --  95 99  BUN 19  --  14 17  CREATININE 0.90  --  0.78 0.79  CALCIUM 9.0  --  8.1* 8.3*  MG  --  1.4* 1.7 1.7  PHOS  --  2.8 3.5  --    Liver Function Tests: Recent Labs  Lab 01/18/20 1258 01/19/20 0809  AST 15 13*  ALT 11 11  ALKPHOS 49 44  BILITOT 1.5* 1.5*  PROT 7.5 6.4*  ALBUMIN 3.2* 2.6*   No results for input(s): LIPASE, AMYLASE  in the last 168 hours. No results for input(s): AMMONIA in the last 168 hours. Cardiac Enzymes: Recent Labs  Lab 01/18/20 1258  CKTOTAL 19*   BNP (last 3 results) No results for input(s): BNP in the last 8760 hours.  ProBNP (last 3 results) No results for input(s): PROBNP in the last 8760 hours.  CBG: No results for input(s): GLUCAP in the last 168 hours. Recent Results (from the past 240 hour(s))  Blood Culture (routine x 2)     Status: None (Preliminary result)   Collection Time: 01/18/20 12:49 PM   Specimen: BLOOD RIGHT FOREARM  Result Value Ref Range Status   Specimen Description   Final    BLOOD RIGHT FOREARM Performed at [facility] [address], [place], [place] [address]    Special Requests   Final    BOTTLES DRAWN AEROBIC AND ANAEROBIC Blood Culture adequate volume Performed at [facility] [address], [place], [place] [address]    Culture   Final    NO GROWTH 2 DAYS Performed at [facility] [address] [place], [place] [address]    Report Status PENDING  Incomplete  Urine culture     Status: None   Collection Time: 01/18/20 12:49 PM   Specimen: In/Out Cath Urine  Result Value Ref Range Status   Specimen Description   Final    IN/OUT CATH URINE Performed at [facility] [address], [place], [place] [address]    Special Requests   Final    NONE Performed at [facility], [place] [address] [place], [place] [address]    Culture   Final    NO GROWTH Performed at [facility], [place] [address] [place], [place] [address]    Report Status 01/20/2020 FINAL  Final  SARS Coronavirus 2 by RT PCR (hospital order, performed in [facility] hospital lab) Nasopharyngeal Nasopharyngeal Swab  Status: None   Collection Time: 01/18/20 12:50 PM   Specimen: Nasopharyngeal Swab  Result Value Ref Range Status   SARS Coronavirus 2 NEGATIVE NEGATIVE Final    Comment:  (NOTE) SARS-CoV-2 target nucleic acids are NOT DETECTED. The SARS-CoV-2 RNA is generally detectable in upper and lower respiratory specimens during the acute phase of infection. The lowest concentration of SARS-CoV-2 viral copies this assay can detect is 250 copies / mL. A negative result does not preclude SARS-CoV-2 infection and should not be used as the sole basis for treatment or other patient management decisions.  A negative result may occur with improper specimen collection / handling, submission of specimen other than nasopharyngeal swab, presence of viral mutation(s) within the areas targeted by this assay, and inadequate number of viral copies (<250 copies / mL). A negative result must be combined with clinical observations, patient history, and epidemiological information. Fact Sheet for Patients:   StrictlyIdeas.no Fact Sheet for Healthcare Providers: BankingDealers.co.za This test is not yet approved or cleared  by the [place] FDA and has been authorized for detection and/or diagnosis of SARS-CoV-2 by FDA under an Emergency Use Authorization (EUA).  This EUA will remain in effect (meaning this test can be used) for the duration of the COVID-19 declaration under Section 564(b)(1) of the Act, 21 U.S.C. section 360bbb-3(b)(1), unless the authorization is terminated or revoked sooner. Performed at [facility], [place] [address], [place], [place] [address]   Blood Culture (routine x 2)     Status: None (Preliminary result)   Collection Time: 01/18/20 12:54 PM   Specimen: BLOOD LEFT FOREARM  Result Value Ref Range Status   Specimen Description   Final    BLOOD LEFT FOREARM Performed at [facility] [address] [place], [place] [address]    Special Requests   Final    BOTTLES DRAWN AEROBIC ONLY Blood Culture adequate volume Performed at [facility]  [address], [place], [place] [address]    Culture   Final    NO GROWTH 2 DAYS Performed at [facility] [address] [place], [place] [address]    Report Status PENDING  Incomplete  Body fluid culture     Status: None (Preliminary result)   Collection Time: 01/18/20  5:06 PM   Specimen: Synovium; Body Fluid  Result Value Ref Range Status   Specimen Description   Final    SYNOVIAL KNEE Performed at [facility] [address], [place], [place] [address]    Special Requests   Final    NONE Performed at [facility], [place] [address], [place], [place] [address]    Gram Stain   Final    WBC PRESENT,BOTH PMN AND MONONUCLEAR NO ORGANISMS SEEN Gram Stain Report Called to,Read Back By and Verified With: GARRISON,G. RN @1850   01/18/20 BILLINGSLEY,L Performed at [facility], [place] [address], [place], [place] [address]    Culture   Final    NO GROWTH 2 DAYS Performed at [facility] [address] [place], [place] [address]    Report Status PENDING  Incomplete     Studies: DG Wrist Complete Left  Result Date: 01/18/2020 CLINICAL DATA:  Bilateral wrist pain. The patient reports a history of gout. EXAM: LEFT WRIST - COMPLETE 3+ VIEW COMPARISON:  Plain films of the left hand 11/28/2019. FINDINGS: No acute bony or joint abnormality. The proximal pole the scaphoid is severely diminutive and  thinned. Severe proximal migration of the capitate is present. There is a large defect in the articular surface of the distal radius and adjacent capitate. Erosive change is also seen at the distal ulna. 2 loose osseous fragments project in the posterior aspect of the radiocarpal joint measuring up to approximately 1 cm in diameter. Soft tissues about the wrist are severely swollen. No chondrocalcinosis of the triangular fibrocartilage. No acute bony abnormality. Visualized MCP joints appear normal. IMPRESSION: No acute finding. Severe SLAC  wrist. Cause for this finding is not definitely identified but may be due to remote avascular necrosis the scaphoid, trauma or arthropathy such as gout. The proximal pole the scaphoid is largely absent. Erosive change in the ulnar styloid and soft tissue swelling about the wrist are nonspecific but could be due to crystal or inflammatory arthropathy. Electronically Signed   By: Inge Rise M.D.   On: 01/18/2020 14:46   DG Wrist Complete Right  Result Date: 01/18/2020 CLINICAL DATA:  BILATERAL wrist and knee pain, history of gout, prostate cancer, hypertension, arthritis EXAM: RIGHT WRIST - COMPLETE 3+ VIEW COMPARISON:  None FINDINGS: Osseous demineralization. Larger erosion at the radial margin of the mid scaphoid. Mild scattered chondrocalcinosis. Joint space narrowing radiocarpal joint. Widening of scapholunate interval consistent with torn scapholunate ligament. No acute fracture, dislocation, or bone destruction. Significant soft tissue swelling at dorsum of hand overlying the CMC joints and proximal metacarpals. IMPRESSION: Scattered degenerative changes and chondrocalcinosis question CPPD. Torn scapholunate ligament. Erosion at mid scaphoid highly suggestive of gout. Electronically Signed   By: Lavonia Dana M.D.   On: 01/18/2020 14:31   DG Chest Port 1 View  Result Date: 01/18/2020 CLINICAL DATA:  Fever EXAM: PORTABLE CHEST 1 VIEW COMPARISON:  11/28/2019 FINDINGS: The heart size and mediastinal contours are stable. Atherosclerotic calcification of the aortic knob. No focal airspace consolidation, pleural effusion, or pneumothorax. Severe degenerative changes of the bilateral shoulders. IMPRESSION: No active disease. Electronically Signed   By: Davina Poke D.O.   On: 01/18/2020 13:29   DG Knee Complete 4 Views Left  Result Date: 01/18/2020 CLINICAL DATA:  LEFT knee pain, history gout EXAM: LEFT KNEE - COMPLETE 4+ VIEW COMPARISON:  None FINDINGS: Osseous demineralization. Scattered degenerative  changes with joint space narrowing greatest at medial compartment. Spurring and fragmentation at inferior margin of patella again identified. Overlying anterior soft tissue swelling of the LEFT knee. Small associated LEFT knee joint effusion. No acute fracture, dislocation or bone destruction. Old healed proximal fibular metadiaphyseal fracture. Extensive small vessel vascular disease changes at knee and in runoff vessels. IMPRESSION: Degenerative changes of the LEFT knee with irregularity, spurring and fragmentation at the inferior pole of the patella, could represent a posttraumatic or inflammatory etiology, associated with persistent overlying soft tissue swelling and small joint effusion. Osteomyelitis not completely excluded and if there is clinical evidence of infection, recommend joint aspiration. These results will be called to the ordering clinician or representative by the Radiologist Assistant, and communication documented in the PACS or [employer]. Electronically Signed   By: Lavonia Dana M.D.   On: 01/18/2020 14:38      Flora Lipps, MD  [facility] 01/20/2020

## 2020-01-21 LAB — BODY FLUID CULTURE: Culture: NO GROWTH

## 2020-01-21 LAB — C-REACTIVE PROTEIN: CRP: 25.6 mg/dL — ABNORMAL HIGH (ref <1.0)

## 2020-01-21 LAB — CBC
HCT: 23.9 % — ABNORMAL LOW (ref 39.0–52.0)
Hemoglobin: 7.7 g/dL — ABNORMAL LOW (ref 13.0–17.0)
MCH: 30 pg (ref 26.0–34.0)
MCHC: 32.2 g/dL (ref 30.0–36.0)
MCV: 93 fL (ref 80.0–100.0)
Platelets: 312 10*3/uL (ref 150–400)
RBC: 2.57 MIL/uL — ABNORMAL LOW (ref 4.22–5.81)
RDW: 16.5 % — ABNORMAL HIGH (ref 11.5–15.5)
WBC: 14.5 10*3/uL — ABNORMAL HIGH (ref 4.0–10.5)
nRBC: 0 % (ref 0.0–0.2)

## 2020-01-21 LAB — BASIC METABOLIC PANEL
Anion gap: 9 (ref 5–15)
BUN: 30 mg/dL — ABNORMAL HIGH (ref 8–23)
CO2: 22 mmol/L (ref 22–32)
Calcium: 8.7 mg/dL — ABNORMAL LOW (ref 8.9–10.3)
Chloride: 102 mmol/L (ref 98–111)
Creatinine, Ser: 1.07 mg/dL (ref 0.61–1.24)
GFR calc Af Amer: >60 mL/min (ref >60)
GFR calc non Af Amer: >60 mL/min (ref >60)
Glucose, Bld: 144 mg/dL — ABNORMAL HIGH (ref 70–99)
Potassium: 3.8 mmol/L (ref 3.5–5.1)
Sodium: 133 mmol/L — ABNORMAL LOW (ref 135–145)

## 2020-01-21 LAB — MAGNESIUM: Magnesium: 1.9 mg/dL (ref 1.7–2.4)

## 2020-01-21 NOTE — Progress Notes (Signed)
Notified patient brother of patient new room 1524.

## 2020-01-21 NOTE — Progress Notes (Signed)
Physical Therapy Treatment Patient Details Name: Shawn Hendrix MRN: [id number] DOB: 1950-09-21 Today's Date: 01/21/2020    History of Present Illness 69 yo male admitted with weakness, L UE/knee pain, SIRS, acute gouty arthropathy. Hx of gout, OA, FTT, Sz, ETOH use, prostate cancer, L knee meniscal tear    PT Comments    Pt with improved tolerance for gait training this day, with less physical assist needed vs yesterday. Pt still with profound unsteadiness during gait with lack of safety awareness and insight into deficits. Pt with x1 seated rest break to adjust socks and recover tachycardia up to 120 bpm during gait (PT-encouraged break), tolerated LE exercises well in recliner. PT to continue to recommend SNF, will continue to follow acutely.     Follow Up Recommendations  SNF     Equipment Recommendations  None recommended by PT    Recommendations for Other Services       Precautions / Restrictions Precautions Precautions: Fall Restrictions Weight Bearing Restrictions: No    Mobility  Bed Mobility Overal bed mobility: Needs Assistance Bed Mobility: Supine to Sit     Supine to sit: Min assist;HOB elevated     General bed mobility comments: Min assist for trunk elevation and LE management to EOB. Increased time and effort with verbal cuing for sequencing.  Transfers Overall transfer level: Needs assistance Equipment used: Rolling walker (2 wheeled) Transfers: Sit to/from Stand Sit to Stand: Min assist;From elevated surface;+2 safety/equipment         General transfer comment: Min assist for power up, steadying. Verbal cuing for hand placement when rising.  Ambulation/Gait Ambulation/Gait assistance: +2 safety/equipment;Min assist Gait Distance (Feet): 100 Feet(+100) Assistive device: Rolling walker (2 wheeled) Gait Pattern/deviations: Step-through pattern;Decreased stride length;Scissoring;Narrow base of support;Trunk flexed;Ataxic Gait velocity: decr    General Gait Details: Min assist to steady both pt and RW, pt randomly lifting RW at times and appears very ataxic. Pt states he is always unsteady in standing, but pretty marked with multiple periods of PT correcting LOB especially with directional changes.   Stairs             Wheelchair Mobility    Modified Rankin (Stroke Patients Only)       Balance Overall balance assessment: Needs assistance Sitting-balance support: Feet supported Sitting balance-Leahy Scale: Fair     Standing balance support: During functional activity;Bilateral upper extremity supported Standing balance-Leahy Scale: Poor Standing balance comment: heavily reliant on external support for stability                            Cognition Arousal/Alertness: Awake/alert Behavior During Therapy: WFL for tasks assessed/performed Overall Cognitive Status: No family/caregiver present to determine baseline cognitive functioning                                 General Comments: Pt is difficult to understand at times, confuses yesterday's PT with this PT multiple times      Exercises General Exercises - Lower Extremity Ankle Circles/Pumps: AROM;Both;20 reps;Seated Long Arc Quad: AROM;Both;10 reps;Seated(+ knee flexion against min resistance x10) Hip ABduction/ADduction: AROM;Both;10 reps;Seated Straight Leg Raises: AROM;Both;10 reps;Seated    General Comments        Pertinent Vitals/Pain Pain Assessment: No/denies pain Pain Intervention(s): Limited activity within patient's tolerance;Monitored during session    Home Living  Prior Function            PT Goals (current goals can now be found in the care plan section) Acute Rehab PT Goals Patient Stated Goal: patient wants to go home PT Goal Formulation: With patient Time For Goal Achievement: 02/02/20 Potential to Achieve Goals: Fair Progress towards PT goals: Progressing toward goals     Frequency    Min 3X/week      PT Plan Current plan remains appropriate    Co-evaluation              AM-PAC PT "6 Clicks" Mobility   Outcome Measure  Help needed turning from your back to your side while in a flat bed without using bedrails?: A Little Help needed moving from lying on your back to sitting on the side of a flat bed without using bedrails?: A Little Help needed moving to and from a bed to a chair (including a wheelchair)?: A Little Help needed standing up from a chair using your arms (e.g., wheelchair or bedside chair)?: A Little Help needed to walk in hospital room?: A Little Help needed climbing 3-5 steps with a railing? : A Lot 6 Click Score: 17    End of Session Equipment Utilized During Treatment: Gait belt Activity Tolerance: Patient tolerated treatment well Patient left: in chair;with call bell/phone within reach;with chair alarm set Nurse Communication: Mobility status PT Visit Diagnosis: Muscle weakness (generalized) (M62.81);Pain;Other abnormalities of gait and mobility (R26.89)     Time: 7616-0737 PT Time Calculation (min) (ACUTE ONLY): 24 min  Charges:  $Gait Training: 8-22 mins $Therapeutic Exercise: 8-22 mins                     Diamante Truszkowski E, PT Acute Rehabilitation Services Pager [phone]  Office [phone]   [name] D [name] 01/21/2020, 5:01 PM

## 2020-01-21 NOTE — Progress Notes (Addendum)
Patient is scheduled for lisinopril and diltiazem at 10AM. Checked patient BP 97/65 HR 66. AMION page sent to Dr. Louanne Belton and notified charge nurse who instructed me to hold Lisinopril and Diltiazem until MD gave further instructions.   Dr. Louanne Belton ordered to hold meds and added parameters to medication order.

## 2020-01-21 NOTE — Progress Notes (Signed)
PROGRESS NOTE  Shawn Hendrix RCV:[id number] DOB: [birth year] DOA: 01/18/2020 PCP: Javier Docker, MD   LOS: 2 days   Brief narrative: As per HPI,  Shawn Hendrix is a 69 y.o. male with medical history significant of history of alcohol use disorder, hypertension, vitamin B-12 deficiency, anemia, gout, prostate cancer, malnutrition alcohol withdrawal, seizures, history of failure to thrive  presented to the hospital with complaints of generalized weakness from home. He was found in the bed soaked in urine has been down for the past few days.  Patient states he spent about 3 weeks at the nursing home but then was discharged back to home to care of his girlfriend.  His left knee and left wrist has been bothering him and that only started after he was exposed to cold air.Recently was admitted for presumed sepsis secondary to colitis in end of April 2021 was discharged to SNF. Completed full course of treatment. He had generalized fatigue and had extensive work-up done including MRI brain MRI left knee and aspiration that was consistent with gout. Had an 2D  echogram done that showed preserved EF.  Patient was then admitted to hospital for further evaluation and treatment.  Assessment/Plan:  Active Problems:   Anemia   Malnutrition of moderate degree   Acute gouty arthropathy   Hypomagnesemia   Essential hypertension   SIRS (systemic inflammatory response syndrome) (HCC)   Alcohol abuse, in remission   Generalized weakness, fatigue .  Patient was seen by physical therapy who recommended skilled nursing facility placement on discharge.  Transition of care on board.  Systemic inflammatory response syndrome.  Likely from acute gouty arthritis.  No other source of infection noted so far.  Patient met SIRS criteria with elevated white blood cell count tachycardia and fever. Blood cultures negative so far.  Evaluate this time.  Synovial fluid ulture was sent on 01/18/2020 showed no organisms.   CRP was elevated at 34>25 including sed rate at 143.  Procalcitonin 1.4.  Magnesium of 1.4.  Synovial fluid count showed WBC of 47,000.  Intracellular monosodium urate crystals were found.  Urine analysis was negative for infection.  COVID-19 test was negative.  Off antibiotics.  Acute gouty arthropathy -significant improvement today.  Joint aspiration shows monosodium urate crystals.  No evidence of infection.  Gout can cause low-grade fever as well. Continue colchicine,  IV Solu-Medrol daily. Recent MRI of left knee as well which did not completely rule out osteo but given fluid showed no evidence of infectious process and cultures were negative most likely noninfectious process.  Anemia -ferritin elevated but total iron low at 10 likely iron deficiency.  Vitamin B12 was 717.  Will monitor CBC.  Check fecal occult blood, pending.  Continue iron sulfate.  Malnutrition of moderate degree -nutritional evaluation appreciated.  Continue Ensure Enlive.  Hyponatremia.  Sodium of 133 today  We will close monitor.  Check BMP in a.m.   Essential hypertension -on Cardizem and lisinopril at home.  Resumed.  Blood pressure is stable at this time.  Hypomagnesemia -improved.  Continue magnesium oxide.  Magnesium 1.9 today.  Alcohol use disorder in remission -monitor for any sign of withdrawal.  Patient has not had a drink in the last week or so.  Will closely monitor during hospitalization.  Continue thiamine folic acid.  VTE Prophylaxis: SCD  Code Status: Full code  Family Communication: None  Status is: Inpatient  The patient is inpatient because: Unsafe d/c plan, , generalized debility needing skilled nursing facility  placement  Dispo: The patient is from: Home              Anticipated d/c is to: Skilled nursing facility              Anticipated d/c date is: 2 days              Patient currently is not medically stable to d/c.  Consultants:  None  Procedures:  Left knee joint  aspiration  Antibiotics:  . Vancomycin and cefepime 6/1>6/3  Anti-infectives (From admission, onward)   Start     Dose/Rate Route Frequency Ordered Stop   01/19/20 2245  metroNIDAZOLE (FLAGYL) IVPB 500 mg  Status:  Discontinued     500 mg 100 mL/hr over 60 Minutes Intravenous Every 8 hours 01/19/20 2230 01/20/20 1202   01/19/20 1830  vancomycin (VANCOREADY) IVPB 500 mg/100 mL  Status:  Discontinued     500 mg 100 mL/hr over 60 Minutes Intravenous Every 12 hours 01/19/20 1814 01/20/20 1147   01/19/20 1400  ceFEPIme (MAXIPIME) 2 g in sodium chloride 0.9 % 100 mL IVPB  Status:  Discontinued     2 g 200 mL/hr over 30 Minutes Intravenous Every 8 hours 01/19/20 0958 01/20/20 1147   01/19/20 0200  vancomycin (VANCOREADY) IVPB 500 mg/100 mL  Status:  Discontinued     500 mg 100 mL/hr over 60 Minutes Intravenous Every 12 hours 01/18/20 2119 01/19/20 1814   01/19/20 0100  ceFEPIme (MAXIPIME) 2 g in sodium chloride 0.9 % 100 mL IVPB  Status:  Discontinued     2 g 200 mL/hr over 30 Minutes Intravenous Every 12 hours 01/18/20 2119 01/19/20 0958   01/18/20 2200  metroNIDAZOLE (FLAGYL) IVPB 500 mg  Status:  Discontinued     500 mg 100 mL/hr over 60 Minutes Intravenous Every 8 hours 01/18/20 2113 01/19/20 2229   01/18/20 1300  ceFEPIme (MAXIPIME) 2 g in sodium chloride 0.9 % 100 mL IVPB     2 g 200 mL/hr over 30 Minutes Intravenous  Once 01/18/20 1249 01/18/20 1346   01/18/20 1300  metroNIDAZOLE (FLAGYL) IVPB 500 mg     500 mg 100 mL/hr over 60 Minutes Intravenous  Once 01/18/20 1249 01/18/20 1358   01/18/20 1300  vancomycin (VANCOCIN) IVPB 1000 mg/200 mL premix     1,000 mg 200 mL/hr over 60 Minutes Intravenous  Once 01/18/20 1249 01/18/20 1512     Subjective: Today, patient complains of less knee pain with improved mobility.  Has generalized weakness.  Denies any nausea vomiting or abdominal pain.  No withdrawal symptoms.  Nurse reported borderline hypotension.  Objective: Vitals:    01/20/20 2119 01/21/20 0508  BP: 99/64 107/75  Pulse: 74 69  Resp: 18 20  Temp: 98.6 F (37 C) 97.6 F (36.4 C)  SpO2: 99% 100%    Intake/Output Summary (Last 24 hours) at 01/21/2020 0756 Last data filed at 01/20/2020 1100 Gross per 24 hour  Intake 237 ml  Output 400 ml  Net -163 ml   Filed Weights   01/18/20 1238  Weight: 54 kg   Body mass index is 23.25 kg/m.   Physical Exam:  GENERAL: Patient is alert awake and oriented. Not in obvious distress.  Thinly built HENT: No scleral pallor or icterus. Oral mucosa is mildly dry.  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 CHEST: Clear to auscultation. No crackles or wheezes.  Diminished breath sounds bilaterally. CVS: S1 and S2 heard, no  murmur. Regular rate and rhythm.  ABDOMEN: Soft, non-tender, bowel sounds are present. EXTREMITIES: Left knee with mild tenderness on palpation with mild effusion and improved range of movement. CNS: Cranial nerves are intact. No focal motor deficits.  Generalized weakness noted SKIN: warm and dry without rashes.  Data Review: I have personally reviewed the following laboratory data and studies,  CBC: Recent Labs  Lab 01/18/20 1258 01/19/20 0809 01/20/20 0543 01/21/20 0519  WBC 12.6* 13.0* 11.6* 14.5*  NEUTROABS 10.7* 11.2*  --   --   HGB 8.7* 8.2* 7.9* 7.7*  HCT 26.4* 25.2* 24.7* 23.9*  MCV 94.0 93.7 93.9 93.0  PLT 284 252 278 782   Basic Metabolic Panel: Recent Labs  Lab 01/18/20 1258 01/18/20 2111 01/19/20 0809 01/20/20 0543 01/21/20 0519  NA 130*  --  131* 129* 133*  K 4.2  --  3.7 3.7 3.8  CL 92*  --  97* 99 102  CO2 25  --  21* 22 22  GLUCOSE 141*  --  95 99 144*  BUN 19  --  14 17 30*  CREATININE 0.90  --  0.78 0.79 1.07  CALCIUM 9.0  --  8.1* 8.3* 8.7*  MG  --  1.4* 1.7 1.7 1.9  PHOS  --  2.8 3.5  --   --    Liver Function Tests: Recent Labs  Lab 01/18/20 1258 01/19/20 0809  AST 15 13*  ALT 11 11  ALKPHOS 49 44   BILITOT 1.5* 1.5*  PROT 7.5 6.4*  ALBUMIN 3.2* 2.6*   No results for input(s): LIPASE, AMYLASE in the last 168 hours. No results for input(s): AMMONIA in the last 168 hours. Cardiac Enzymes: Recent Labs  Lab 01/18/20 1258  CKTOTAL 19*   BNP (last 3 results) No results for input(s): BNP in the last 8760 hours.  ProBNP (last 3 results) No results for input(s): PROBNP in the last 8760 hours.  CBG: No results for input(s): GLUCAP in the last 168 hours. Recent Results (from the past 240 hour(s))  Blood Culture (routine x 2)     Status: None (Preliminary result)   Collection Time: 01/18/20 12:49 PM   Specimen: BLOOD RIGHT FOREARM  Result Value Ref Range Status   Specimen Description   Final    BLOOD RIGHT FOREARM Performed at [facility] [address], [place], [place] [address]    Special Requests   Final    BOTTLES DRAWN AEROBIC AND ANAEROBIC Blood Culture adequate volume Performed at [facility] [address] [place], [place] [address]    Culture   Final    NO GROWTH 2 DAYS Performed at [facility] [address] [place], [place] [address]    Report Status PENDING  Incomplete  Urine culture     Status: None   Collection Time: 01/18/20 12:49 PM   Specimen: In/Out Cath Urine  Result Value Ref Range Status   Specimen Description   Final    IN/OUT CATH URINE Performed at [facility] [address] [place], [place] [address]    Special Requests   Final    NONE Performed at [facility], [place] [address], [place], [place] [address]    Culture   Final    NO GROWTH Performed at [facility], [place] [address] [place], [place] [address]    Report Status 01/20/2020 FINAL  Final  SARS Coronavirus 2 by RT PCR (hospital order, performed  in [facility] lab) Nasopharyngeal Nasopharyngeal Swab     Status: None   Collection Time: 01/18/20 12:50 PM   Specimen: Nasopharyngeal  Swab  Result Value Ref Range Status   SARS Coronavirus 2 NEGATIVE NEGATIVE Final    Comment: (NOTE) SARS-CoV-2 target nucleic acids are NOT DETECTED. The SARS-CoV-2 RNA is generally detectable in upper and lower respiratory specimens during the acute phase of infection. The lowest concentration of SARS-CoV-2 viral copies this assay can detect is 250 copies / mL. A negative result does not preclude SARS-CoV-2 infection and should not be used as the sole basis for treatment or other patient management decisions.  A negative result may occur with improper specimen collection / handling, submission of specimen other than nasopharyngeal swab, presence of viral mutation(s) within the areas targeted by this assay, and inadequate number of viral copies (<250 copies / mL). A negative result must be combined with clinical observations, patient history, and epidemiological information. Fact Sheet for Patients:   StrictlyIdeas.no Fact Sheet for Healthcare Providers: BankingDealers.co.za This test is not yet approved or cleared  by the [place] FDA and has been authorized for detection and/or diagnosis of SARS-CoV-2 by FDA under an Emergency Use Authorization (EUA).  This EUA will remain in effect (meaning this test can be used) for the duration of the COVID-19 declaration under Section 564(b)(1) of the Act, 21 U.S.C. section 360bbb-3(b)(1), unless the authorization is terminated or revoked sooner. Performed at [facility], [place] [address], [place], [place] [address]   Blood Culture (routine x 2)     Status: None (Preliminary result)   Collection Time: 01/18/20 12:54 PM   Specimen: BLOOD LEFT FOREARM  Result Value Ref Range Status   Specimen Description   Final    BLOOD LEFT FOREARM Performed at [facility] [address] [place], [place] [address]    Special Requests   Final    BOTTLES DRAWN AEROBIC  ONLY Blood Culture adequate volume Performed at [facility] [address] [place], [place] [address]    Culture   Final    NO GROWTH 2 DAYS Performed at [facility] [address], [place], [place] [address]    Report Status PENDING  Incomplete  Body fluid culture     Status: None (Preliminary result)   Collection Time: 01/18/20  5:06 PM   Specimen: Synovium; Body Fluid  Result Value Ref Range Status   Specimen Description   Final    SYNOVIAL KNEE Performed at [facility] [address] [place], [place] [address]    Special Requests   Final    NONE Performed at [facility], [place] [address], [place], [place] [address]    Gram Stain   Final    WBC PRESENT,BOTH PMN AND MONONUCLEAR NO ORGANISMS SEEN Gram Stain Report Called to,Read Back By and Verified With: GARRISON,G. RN @1850   01/18/20 BILLINGSLEY,L Performed at [facility], [place] [address] [place], [place] [address]    Culture   Final    NO GROWTH 2 DAYS Performed at [facility] [address], [place], [place] [address]    Report Status PENDING  Incomplete     Studies: No results found.   Flora Lipps, MD  [facility] 01/21/2020

## 2020-01-22 LAB — BASIC METABOLIC PANEL
Anion gap: 9 (ref 5–15)
BUN: 33 mg/dL — ABNORMAL HIGH (ref 8–23)
CO2: 23 mmol/L (ref 22–32)
Calcium: 8.8 mg/dL — ABNORMAL LOW (ref 8.9–10.3)
Chloride: 101 mmol/L (ref 98–111)
Creatinine, Ser: 0.71 mg/dL (ref 0.61–1.24)
GFR calc Af Amer: >60 mL/min (ref >60)
GFR calc non Af Amer: >60 mL/min (ref >60)
Glucose, Bld: 117 mg/dL — ABNORMAL HIGH (ref 70–99)
Potassium: 3.7 mmol/L (ref 3.5–5.1)
Sodium: 133 mmol/L — ABNORMAL LOW (ref 135–145)

## 2020-01-22 LAB — CBC
HCT: 22.6 % — ABNORMAL LOW (ref 39.0–52.0)
Hemoglobin: 7.5 g/dL — ABNORMAL LOW (ref 13.0–17.0)
MCH: 31.1 pg (ref 26.0–34.0)
MCHC: 33.2 g/dL (ref 30.0–36.0)
MCV: 93.8 fL (ref 80.0–100.0)
Platelets: 376 10*3/uL (ref 150–400)
RBC: 2.41 MIL/uL — ABNORMAL LOW (ref 4.22–5.81)
RDW: 16.2 % — ABNORMAL HIGH (ref 11.5–15.5)
WBC: 14.6 10*3/uL — ABNORMAL HIGH (ref 4.0–10.5)
nRBC: 0 % (ref 0.0–0.2)

## 2020-01-22 LAB — MAGNESIUM: Magnesium: 1.9 mg/dL (ref 1.7–2.4)

## 2020-01-22 MED ORDER — SENNA 8.6 MG PO TABS
1.0000 | ORAL_TABLET | Freq: Two times a day (BID) | ORAL | Status: DC
  Administered 2020-01-22 – 2020-01-24 (×5): 8.6 mg via ORAL
  Filled 2020-01-22 (×5): qty 1

## 2020-01-22 MED ORDER — POLYETHYLENE GLYCOL 3350 17 G PO PACK
17.0000 g | PACK | Freq: Every day | ORAL | Status: DC
  Administered 2020-01-22 – 2020-01-24 (×3): 17 g via ORAL
  Filled 2020-01-22 (×3): qty 1

## 2020-01-22 NOTE — Progress Notes (Addendum)
PROGRESS NOTE  Shawn Hendrix UKG:[id number] DOB: 1950/12/23 DOA: 01/18/2020 PCP: Javier Docker, MD   LOS: 3 days   Brief narrative: As per HPI,  Shawn Hendrix is a 69 y.o. male with medical history significant of history of alcohol use disorder, hypertension, vitamin B-12 deficiency, anemia, gout, prostate cancer, malnutrition alcohol withdrawal, seizures, history of failure to thrive  presented to the hospital with complaints of generalized weakness from home. He was found in the bed soaked in urine has been down for the past few days.  Patient states he spent about 3 weeks at the nursing home but then was discharged back to home to care of his girlfriend.  His left knee and left wrist has been bothering him and that only started after he was exposed to cold air.Recently was admitted for presumed sepsis secondary to colitis in end of April 2021 was discharged to SNF. Completed full course of treatment. He had generalized fatigue and had extensive work-up done including MRI brain MRI left knee and aspiration that was consistent with gout. Had an 2D  echogram done that showed preserved EF.  Patient was then admitted to hospital for further evaluation and treatment.  Assessment/Plan:  Active Problems:   Anemia   Malnutrition of moderate degree   Acute gouty arthropathy   Hypomagnesemia   Essential hypertension   SIRS (systemic inflammatory response syndrome) (HCC)   Alcohol abuse, in remission   Generalized weakness, fatigue .  Patient was seen by physical therapy who recommended skilled nursing facility placement on discharge.  Awaiting for skilled nursing facility placement.  Systemic inflammatory response syndrome.  On presentation likely from acute gouty arthritis.  No other source of infection noted so far.  Patient met SIRS criteria with elevated white blood cell count tachycardia and fever. Blood cultures negative so far.   Synovial fluid culture was sent on 01/18/2020  showed no organisms.   Synovial fluid count showed WBC of 47,000.  Intracellular monosodium urate crystals were found.  Urine analysis was negative for infection.  COVID-19 test was negative.  Off antibiotics.  Blood cultures negative so far.  Acute gouty arthropathy continues to improve with IV Solu-Medrol, colchicine.  Joint aspiration shows monosodium urate crystals.  No evidence of infection.  . Recent MRI of left knee as well which did not completely rule out osteo but given fluid showed no evidence of infectious process and cultures were negative most likely noninfectious process.  Anemia -ferritin elevated but total iron low at 10 likely iron deficiency.  Vitamin B12 was 717.  Will monitor CBC.  Check fecal occult blood, pending.  Continue iron sulfate.  HIV nonreactive.  Malnutrition of moderate degree -nutritional evaluation appreciated.  Continue Ensure Enlive.  Hyponatremia.  Sodium of 133 today  .  Monitor BMP   Essential hypertension -blood pressure is stable on on Cardizem and lisinopril.  Hypomagnesemia -improved.  Continue magnesium oxide.  Magnesium 1.9 today.  Alcohol use disorder in remission -monitor for any sign of withdrawal.  Patient has not had a drink in the last week or so.  Will closely monitor during hospitalization.  Continue thiamine folic acid.  VTE Prophylaxis: SCD  Code Status: Full code  Family Communication:  I spoke with the patient's brother [name] on the phone and updated him about the clinical condition of the patient.  Status is: Inpatient  The patient is inpatient because: Unsafe d/c plan, , generalized debility and ambulatory dysfunction needing skilled nursing facility placement  Dispo: The patient  is from: Home              Anticipated d/c is to: Skilled nursing facility              Anticipated d/c date is: 2 days              Patient currently is medically stable for disposition.  Consultants:  None  Procedures:  Left knee joint  aspiration  Antibiotics:  . Vancomycin and cefepime 6/1>6/3  Anti-infectives (From admission, onward)   Start     Dose/Rate Route Frequency Ordered Stop   01/19/20 2245  metroNIDAZOLE (FLAGYL) IVPB 500 mg  Status:  Discontinued     500 mg 100 mL/hr over 60 Minutes Intravenous Every 8 hours 01/19/20 2230 01/20/20 1202   01/19/20 1830  vancomycin (VANCOREADY) IVPB 500 mg/100 mL  Status:  Discontinued     500 mg 100 mL/hr over 60 Minutes Intravenous Every 12 hours 01/19/20 1814 01/20/20 1147   01/19/20 1400  ceFEPIme (MAXIPIME) 2 g in sodium chloride 0.9 % 100 mL IVPB  Status:  Discontinued     2 g 200 mL/hr over 30 Minutes Intravenous Every 8 hours 01/19/20 0958 01/20/20 1147   01/19/20 0200  vancomycin (VANCOREADY) IVPB 500 mg/100 mL  Status:  Discontinued     500 mg 100 mL/hr over 60 Minutes Intravenous Every 12 hours 01/18/20 2119 01/19/20 1814   01/19/20 0100  ceFEPIme (MAXIPIME) 2 g in sodium chloride 0.9 % 100 mL IVPB  Status:  Discontinued     2 g 200 mL/hr over 30 Minutes Intravenous Every 12 hours 01/18/20 2119 01/19/20 0958   01/18/20 2200  metroNIDAZOLE (FLAGYL) IVPB 500 mg  Status:  Discontinued     500 mg 100 mL/hr over 60 Minutes Intravenous Every 8 hours 01/18/20 2113 01/19/20 2229   01/18/20 1300  ceFEPIme (MAXIPIME) 2 g in sodium chloride 0.9 % 100 mL IVPB     2 g 200 mL/hr over 30 Minutes Intravenous  Once 01/18/20 1249 01/18/20 1346   01/18/20 1300  metroNIDAZOLE (FLAGYL) IVPB 500 mg     500 mg 100 mL/hr over 60 Minutes Intravenous  Once 01/18/20 1249 01/18/20 1358   01/18/20 1300  vancomycin (VANCOCIN) IVPB 1000 mg/200 mL premix     1,000 mg 200 mL/hr over 60 Minutes Intravenous  Once 01/18/20 1249 01/18/20 1512     Subjective: Today, patient complains of constipation.  Joint pain has improved.  No nausea vomiting fever chills.  Blood pressure better today.  Objective: Vitals:   01/21/20 2102 01/22/20 0519  BP: 115/76 133/80  Pulse: 84 70  Resp: 18 17    Temp: 97.6 F (36.4 C) 97.7 F (36.5 C)  SpO2: 100% 100%   No intake or output data in the 24 hours ending 01/22/20 0835 Filed Weights   01/18/20 1238  Weight: 54 kg   Body mass index is 23.25 kg/m.   Physical Exam:  General: Thinly built, not in obvious distress, wake communicative HENT:   No scleral pallor or icterus noted. Oral mucosa is moist.  Enucleated right eye.  Poor dentition. Chest:  Clear breath sounds.  Diminished breath sounds bilaterally. No crackles or wheezes.  CVS: S1 &S2 heard. No murmur.  Regular rate and rhythm. Abdomen: Soft, nontender, nondistended.  Bowel sounds are heard.   Extremities: No cyanosis, clubbing or edema.  Peripheral pulses are palpable.  Left knee with increased range of movement with mild tenderness on palpation. Psych: Alert, awake and  communicative, normal mood CNS:  No cranial nerve deficits.  Power equal in all extremities.  Generalized weakness noted Skin: Warm and dry.  No rashes noted.   Data Review: I have personally reviewed the following laboratory data and studies,  CBC: Recent Labs  Lab 01/18/20 1258 01/19/20 0809 01/20/20 0543 01/21/20 0519 01/22/20 0449  WBC 12.6* 13.0* 11.6* 14.5* 14.6*  NEUTROABS 10.7* 11.2*  --   --   --   HGB 8.7* 8.2* 7.9* 7.7* 7.5*  HCT 26.4* 25.2* 24.7* 23.9* 22.6*  MCV 94.0 93.7 93.9 93.0 93.8  PLT 284 252 278 312 633   Basic Metabolic Panel: Recent Labs  Lab 01/18/20 1258 01/18/20 2111 01/19/20 0809 01/20/20 0543 01/21/20 0519 01/22/20 0449  NA 130*  --  131* 129* 133* 133*  K 4.2  --  3.7 3.7 3.8 3.7  CL 92*  --  97* 99 102 101  CO2 25  --  21* 22 22 23   GLUCOSE 141*  --  95 99 144* 117*  BUN 19  --  14 17 30* 33*  CREATININE 0.90  --  0.78 0.79 1.07 0.71  CALCIUM 9.0  --  8.1* 8.3* 8.7* 8.8*  MG  --  1.4* 1.7 1.7 1.9 1.9  PHOS  --  2.8 3.5  --   --   --    Liver Function Tests: Recent Labs  Lab 01/18/20 1258 01/19/20 0809  AST 15 13*  ALT 11 11  ALKPHOS 49 44   BILITOT 1.5* 1.5*  PROT 7.5 6.4*  ALBUMIN 3.2* 2.6*   No results for input(s): LIPASE, AMYLASE in the last 168 hours. No results for input(s): AMMONIA in the last 168 hours. Cardiac Enzymes: Recent Labs  Lab 01/18/20 1258  CKTOTAL 19*   BNP (last 3 results) No results for input(s): BNP in the last 8760 hours.  ProBNP (last 3 results) No results for input(s): PROBNP in the last 8760 hours.  CBG: No results for input(s): GLUCAP in the last 168 hours. Recent Results (from the past 240 hour(s))  Blood Culture (routine x 2)     Status: None (Preliminary result)   Collection Time: 01/18/20 12:49 PM   Specimen: BLOOD RIGHT FOREARM  Result Value Ref Range Status   Specimen Description   Final    BLOOD RIGHT FOREARM Performed at [facility] [address] [place], [place] [address]    Special Requests   Final    BOTTLES DRAWN AEROBIC AND ANAEROBIC Blood Culture adequate volume Performed at [facility] [address], [place], [place] [address]    Culture   Final    NO GROWTH 4 DAYS Performed at [facility], [place] [address], [place], [place] [address]    Report Status PENDING  Incomplete  Urine culture     Status: None   Collection Time: 01/18/20 12:49 PM   Specimen: In/Out Cath Urine  Result Value Ref Range Status   Specimen Description   Final    IN/OUT CATH URINE Performed at [facility] [address], [place], [place] [address]    Special Requests   Final    NONE Performed at [facility], [place] [address], [place], [place] [address]    Culture   Final    NO GROWTH Performed at [facility], [place] [address] [place], [place] [address]    Report Status 01/20/2020 FINAL  Final  SARS Coronavirus 2 by RT  PCR (hospital order, performed in [facility] hospital lab) Nasopharyngeal Nasopharyngeal Swab     Status: None   Collection Time: 01/18/20 12:50 PM   Specimen: Nasopharyngeal  Swab  Result Value Ref Range Status   SARS Coronavirus 2 NEGATIVE NEGATIVE Final    Comment: (NOTE) SARS-CoV-2 target nucleic acids are NOT DETECTED. The SARS-CoV-2 RNA is generally detectable in upper and lower respiratory specimens during the acute phase of infection. The lowest concentration of SARS-CoV-2 viral copies this assay can detect is 250 copies / mL. A negative result does not preclude SARS-CoV-2 infection and should not be used as the sole basis for treatment or other patient management decisions.  A negative result may occur with improper specimen collection / handling, submission of specimen other than nasopharyngeal swab, presence of viral mutation(s) within the areas targeted by this assay, and inadequate number of viral copies (<250 copies / mL). A negative result must be combined with clinical observations, patient history, and epidemiological information. Fact Sheet for Patients:   StrictlyIdeas.no Fact Sheet for Healthcare Providers: BankingDealers.co.za This test is not yet approved or cleared  by the [place] FDA and has been authorized for detection and/or diagnosis of SARS-CoV-2 by FDA under an Emergency Use Authorization (EUA).  This EUA will remain in effect (meaning this test can be used) for the duration of the COVID-19 declaration under Section 564(b)(1) of the Act, 21 U.S.C. section 360bbb-3(b)(1), unless the authorization is terminated or revoked sooner. Performed at [facility], [place] [address] [place], [place] [address]   Blood Culture (routine x 2)     Status: None (Preliminary result)   Collection Time: 01/18/20 12:54 PM   Specimen: BLOOD LEFT FOREARM  Result Value Ref Range Status   Specimen Description   Final    BLOOD LEFT FOREARM Performed at [facility] [address] [place], [place] [address]    Special Requests   Final    BOTTLES DRAWN AEROBIC  ONLY Blood Culture adequate volume Performed at [facility] [address] [place], [place] [address]    Culture   Final    NO GROWTH 4 DAYS Performed at [facility], [place] [address], [place], [place] [address]    Report Status PENDING  Incomplete  Body fluid culture     Status: None   Collection Time: 01/18/20  5:06 PM   Specimen: Synovium; Body Fluid  Result Value Ref Range Status   Specimen Description   Final    SYNOVIAL KNEE Performed at [facility] [address] [place], [place] [address]    Special Requests   Final    NONE Performed at [facility], [place] [address], [place], [place] [address]    Gram Stain   Final    WBC PRESENT,BOTH PMN AND MONONUCLEAR NO ORGANISMS SEEN Gram Stain Report Called to,Read Back By and Verified With: GARRISON,G. RN @1850   01/18/20 BILLINGSLEY,L Performed at [facility], [place] [address], [place], [place] [address]    Culture   Final    NO GROWTH 3 DAYS Performed at [facility], [place] [address], [place], [place] [address]    Report Status 01/21/2020 FINAL  Final     Studies: No results found.   Flora Lipps, MD  [facility] 01/22/2020

## 2020-01-23 LAB — CBC
HCT: 23.6 % — ABNORMAL LOW (ref 39.0–52.0)
Hemoglobin: 7.6 g/dL — ABNORMAL LOW (ref 13.0–17.0)
MCH: 29.8 pg (ref 26.0–34.0)
MCHC: 32.2 g/dL (ref 30.0–36.0)
MCV: 92.5 fL (ref 80.0–100.0)
Platelets: 425 10*3/uL — ABNORMAL HIGH (ref 150–400)
RBC: 2.55 MIL/uL — ABNORMAL LOW (ref 4.22–5.81)
RDW: 16.5 % — ABNORMAL HIGH (ref 11.5–15.5)
WBC: 14.2 10*3/uL — ABNORMAL HIGH (ref 4.0–10.5)
nRBC: 0.2 % (ref 0.0–0.2)

## 2020-01-23 LAB — MAGNESIUM: Magnesium: 1.9 mg/dL (ref 1.7–2.4)

## 2020-01-23 LAB — BASIC METABOLIC PANEL
Anion gap: 11 (ref 5–15)
BUN: 29 mg/dL — ABNORMAL HIGH (ref 8–23)
CO2: 23 mmol/L (ref 22–32)
Calcium: 8.9 mg/dL (ref 8.9–10.3)
Chloride: 102 mmol/L (ref 98–111)
Creatinine, Ser: 0.67 mg/dL (ref 0.61–1.24)
GFR calc Af Amer: >60 mL/min (ref >60)
GFR calc non Af Amer: >60 mL/min (ref >60)
Glucose, Bld: 121 mg/dL — ABNORMAL HIGH (ref 70–99)
Potassium: 4.1 mmol/L (ref 3.5–5.1)
Sodium: 136 mmol/L (ref 135–145)

## 2020-01-23 LAB — CULTURE, BLOOD (ROUTINE X 2)
Culture: NO GROWTH
Culture: NO GROWTH
Special Requests: ADEQUATE
Special Requests: ADEQUATE

## 2020-01-23 NOTE — Progress Notes (Signed)
PROGRESS NOTE  Shawn Hendrix GQB:[id number] DOB: 1951-03-24 DOA: 01/18/2020 PCP: Javier Docker, MD   LOS: 4 days   Brief narrative: As per HPI,  Shawn Hendrix is a 69 y.o. male with medical history significant of history of alcohol use disorder, hypertension, vitamin B-12 deficiency, anemia, gout, prostate cancer, malnutrition alcohol withdrawal, seizures, history of failure to thrive  presented to the hospital with complaints of generalized weakness from home. He was found in the bed soaked in urine has been down for the past few days.  Patient states he spent about 3 weeks at the nursing home but then was discharged back to home to care of his girlfriend.  His left knee and left wrist has been bothering him and that only started after he was exposed to cold air.Recently was admitted for presumed sepsis secondary to colitis in end of April 2021 was discharged to SNF. Completed full course of treatment. He had generalized fatigue and had extensive work-up done including MRI brain MRI left knee and aspiration that was consistent with gout. Had an 2D  echogram done that showed preserved EF.  Patient was then admitted to hospital for further evaluation and treatment.  Assessment/Plan:  Active Problems:   Anemia   Malnutrition of moderate degree   Acute gouty arthropathy   Hypomagnesemia   Essential hypertension   SIRS (systemic inflammatory response syndrome) (HCC)   Alcohol abuse, in remission   Generalized weakness, fatigue .  Patient was seen by physical therapy who recommended skilled nursing facility placement on discharge.  Awaiting for skilled nursing facility placement.  Systemic inflammatory response syndrome.  On presentation likely from acute gouty arthritis.  No other source of infection noted so far.  Patient met SIRS criteria with elevated white blood cell count tachycardia and fever. Blood cultures negative so far.   Synovial fluid culture was sent on 01/18/2020  showed no organisms.   Synovial fluid count showed WBC of 47,000.  Intracellular monosodium urate crystals were found.  Urine analysis was negative for infection.  COVID-19 test was negative.  Off antibiotics.  Blood cultures negative so far.  Acute gouty arthropathy continues to improve with IV Solu-Medrol, colchicine.  Joint aspiration shows monosodium urate crystals.  No evidence of infection.  . Recent MRI of left knee as well which did not completely rule out osteo but given fluid showed no evidence of infectious process and cultures were negative most likely noninfectious process.  Anemia -ferritin elevated but total iron low at 10 likely iron deficiency.  Vitamin B12 was 717.  Will monitor CBC.  Check fecal occult blood, pending.  Continue iron sulfate.  HIV nonreactive.  Malnutrition of moderate degree -nutritional evaluation appreciated.  Continue Ensure, Enlive.  Hyponatremia.  Sodium of 136 today  .  Monitor BMP   Essential hypertension -blood pressure is stable, continue Cardizem and lisinopril.  Hypomagnesemia -improved.  Continue magnesium oxide.  Magnesium 1.9 today.  Alcohol use disorder in remission -monitor for any sign of withdrawal.  Patient has not had a drink in the last week or so.  Will closely monitor during hospitalization.  Continue thiamine folic acid.  VTE Prophylaxis: SCD  Code Status: Full code  Family Communication: None today.  I spoke with the patient's brother [name] on the phone yesterday. Status is: Inpatient  The patient is inpatient because: Unsafe d/c plan, , generalized debility and ambulatory dysfunction needing skilled nursing facility placement  Dispo: The patient is from: Home  Anticipated d/c is to: Skilled nursing facility              Anticipated d/c date is: 2 days              Patient currently is medically stable for disposition.  Consultants:  None  Procedures:  Left knee joint  aspiration  Antibiotics:  . Vancomycin and cefepime 6/1>6/3  Anti-infectives (From admission, onward)   Start     Dose/Rate Route Frequency Ordered Stop   01/19/20 2245  metroNIDAZOLE (FLAGYL) IVPB 500 mg  Status:  Discontinued     500 mg 100 mL/hr over 60 Minutes Intravenous Every 8 hours 01/19/20 2230 01/20/20 1202   01/19/20 1830  vancomycin (VANCOREADY) IVPB 500 mg/100 mL  Status:  Discontinued     500 mg 100 mL/hr over 60 Minutes Intravenous Every 12 hours 01/19/20 1814 01/20/20 1147   01/19/20 1400  ceFEPIme (MAXIPIME) 2 g in sodium chloride 0.9 % 100 mL IVPB  Status:  Discontinued     2 g 200 mL/hr over 30 Minutes Intravenous Every 8 hours 01/19/20 0958 01/20/20 1147   01/19/20 0200  vancomycin (VANCOREADY) IVPB 500 mg/100 mL  Status:  Discontinued     500 mg 100 mL/hr over 60 Minutes Intravenous Every 12 hours 01/18/20 2119 01/19/20 1814   01/19/20 0100  ceFEPIme (MAXIPIME) 2 g in sodium chloride 0.9 % 100 mL IVPB  Status:  Discontinued     2 g 200 mL/hr over 30 Minutes Intravenous Every 12 hours 01/18/20 2119 01/19/20 0958   01/18/20 2200  metroNIDAZOLE (FLAGYL) IVPB 500 mg  Status:  Discontinued     500 mg 100 mL/hr over 60 Minutes Intravenous Every 8 hours 01/18/20 2113 01/19/20 2229   01/18/20 1300  ceFEPIme (MAXIPIME) 2 g in sodium chloride 0.9 % 100 mL IVPB     2 g 200 mL/hr over 30 Minutes Intravenous  Once 01/18/20 1249 01/18/20 1346   01/18/20 1300  metroNIDAZOLE (FLAGYL) IVPB 500 mg     500 mg 100 mL/hr over 60 Minutes Intravenous  Once 01/18/20 1249 01/18/20 1358   01/18/20 1300  vancomycin (VANCOCIN) IVPB 1000 mg/200 mL premix     1,000 mg 200 mL/hr over 60 Minutes Intravenous  Once 01/18/20 1249 01/18/20 1512     Subjective: Today, patient complains of improving joint pain.  Denies any shortness of breath,  fever.  Mild cough.  Has been eating better.  Complains of generalized weakness.  Objective: Vitals:   01/22/20 1951 01/23/20 0543  BP: 125/79  115/72  Pulse: 73 62  Resp: 17 18  Temp: 98 F (36.7 C) 98.1 F (36.7 C)  SpO2: 100% 100%    Intake/Output Summary (Last 24 hours) at 01/23/2020 0727 Last data filed at 01/22/2020 1700 Gross per 24 hour  Intake 765 ml  Output 900 ml  Net -135 ml   Filed Weights   01/18/20 1238  Weight: 54 kg   Body mass index is 23.25 kg/m.   Physical Exam:  General: Thinly built, not in obvious distress, awake communicative HENT:   No scleral pallor or icterus noted. Oral mucosa is moist.  Enucleated right eye.  Poor dentition. Chest:  Clear breath sounds.  Diminished breath sounds bilaterally. No crackles or wheezes.  CVS: S1 &S2 heard. No murmur.  Regular rate and rhythm. Abdomen: Soft, nontender, nondistended.  Bowel sounds are heard.   Extremities: No cyanosis, clubbing or edema.  Peripheral pulses are palpable.  Left knee with increased range  of movement with mild tenderness on palpation. Psych: Alert, awake and communicative, normal mood CNS:  No cranial nerve deficits.  Power equal in all extremities.  Generalized weakness noted Skin: Warm and dry.  No rashes noted.   Data Review: I have personally reviewed the following laboratory data and studies,  CBC: Recent Labs  Lab 01/18/20 1258 01/18/20 1258 01/19/20 0809 01/20/20 0543 01/21/20 0519 01/22/20 0449 01/23/20 0503  WBC 12.6*   < > 13.0* 11.6* 14.5* 14.6* 14.2*  NEUTROABS 10.7*  --  11.2*  --   --   --   --   HGB 8.7*   < > 8.2* 7.9* 7.7* 7.5* 7.6*  HCT 26.4*   < > 25.2* 24.7* 23.9* 22.6* 23.6*  MCV 94.0   < > 93.7 93.9 93.0 93.8 92.5  PLT 284   < > 252 278 312 376 425*   < > = values in this interval not displayed.   Basic Metabolic Panel: Recent Labs  Lab 01/18/20 1258 01/18/20 2111 01/19/20 0809 01/20/20 0543 01/21/20 0519 01/22/20 0449 01/23/20 0503  NA   < >  --  131* 129* 133* 133* 136  K   < >  --  3.7 3.7 3.8 3.7 4.1  CL   < >  --  97* 99 102 101 102  CO2   < >  --  21* 22 22 23 23   GLUCOSE   < >   --  95 99 144* 117* 121*  BUN   < >  --  14 17 30* 33* 29*  CREATININE   < >  --  0.78 0.79 1.07 0.71 0.67  CALCIUM   < >  --  8.1* 8.3* 8.7* 8.8* 8.9  MG   < > 1.4* 1.7 1.7 1.9 1.9 1.9  PHOS  --  2.8 3.5  --   --   --   --    < > = values in this interval not displayed.   Liver Function Tests: Recent Labs  Lab 01/18/20 1258 01/19/20 0809  AST 15 13*  ALT 11 11  ALKPHOS 49 44  BILITOT 1.5* 1.5*  PROT 7.5 6.4*  ALBUMIN 3.2* 2.6*   No results for input(s): LIPASE, AMYLASE in the last 168 hours. No results for input(s): AMMONIA in the last 168 hours. Cardiac Enzymes: Recent Labs  Lab 01/18/20 1258  CKTOTAL 19*   BNP (last 3 results) No results for input(s): BNP in the last 8760 hours.  ProBNP (last 3 results) No results for input(s): PROBNP in the last 8760 hours.  CBG: No results for input(s): GLUCAP in the last 168 hours. Recent Results (from the past 240 hour(s))  Blood Culture (routine x 2)     Status: None (Preliminary result)   Collection Time: 01/18/20 12:49 PM   Specimen: BLOOD RIGHT FOREARM  Result Value Ref Range Status   Specimen Description   Final    BLOOD RIGHT FOREARM Performed at [facility] [address], [place], [place] [address]    Special Requests   Final    BOTTLES DRAWN AEROBIC AND ANAEROBIC Blood Culture adequate volume Performed at [facility] [address] [place], [place] [address]    Culture   Final    NO GROWTH 4 DAYS Performed at [facility], [place] [address] [place], [place] [address]    Report Status PENDING  Incomplete  Urine culture     Status: None   Collection  Time: 01/18/20 12:49 PM   Specimen: In/Out Cath Urine  Result Value Ref Range Status   Specimen Description   Final    IN/OUT CATH URINE Performed at [facility], [place] [address], [place], [place] [address]    Special Requests   Final    NONE Performed at [facility], [place]  [address], [place], [place] [address]    Culture   Final    NO GROWTH Performed at [facility], [place] [address] [place], [place] [address]    Report Status 01/20/2020 FINAL  Final  SARS Coronavirus 2 by RT PCR (hospital order, performed in [facility] hospital lab) Nasopharyngeal Nasopharyngeal Swab     Status: None   Collection Time: 01/18/20 12:50 PM   Specimen: Nasopharyngeal Swab  Result Value Ref Range Status   SARS Coronavirus 2 NEGATIVE NEGATIVE Final    Comment: (NOTE) SARS-CoV-2 target nucleic acids are NOT DETECTED. The SARS-CoV-2 RNA is generally detectable in upper and lower respiratory specimens during the acute phase of infection. The lowest concentration of SARS-CoV-2 viral copies this assay can detect is 250 copies / mL. A negative result does not preclude SARS-CoV-2 infection and should not be used as the sole basis for treatment or other patient management decisions.  A negative result may occur with improper specimen collection / handling, submission of specimen other than nasopharyngeal swab, presence of viral mutation(s) within the areas targeted by this assay, and inadequate number of viral copies (<250 copies / mL). A negative result must be combined with clinical observations, patient history, and epidemiological information. Fact Sheet for Patients:   StrictlyIdeas.no Fact Sheet for Healthcare Providers: BankingDealers.co.za This test is not yet approved or cleared  by the [place] FDA and has been authorized for detection and/or diagnosis of SARS-CoV-2 by FDA under an Emergency Use Authorization (EUA).  This EUA will remain in effect (meaning this test can be used) for the duration of the COVID-19 declaration under Section 564(b)(1) of the Act, 21 U.S.C. section 360bbb-3(b)(1), unless the authorization is terminated or revoked sooner. Performed at [facility], [place] [address] [place], [place] [address]   Blood Culture (routine x 2)     Status: None (Preliminary result)   Collection Time: 01/18/20 12:54 PM   Specimen: BLOOD LEFT FOREARM  Result Value Ref Range Status   Specimen Description   Final    BLOOD LEFT FOREARM Performed at [facility] [address], [place], [place] [address]    Special Requests   Final    BOTTLES DRAWN AEROBIC ONLY Blood Culture adequate volume Performed at [facility] [address], [place], [place] [address]    Culture   Final    NO GROWTH 4 DAYS Performed at [facility], [place] [address], [place], [place] [address]    Report Status PENDING  Incomplete  Body fluid culture     Status: None   Collection Time: 01/18/20  5:06 PM   Specimen: Synovium; Body Fluid  Result Value Ref Range Status   Specimen Description   Final    SYNOVIAL KNEE Performed at [facility] [address], [place], [place] [address]    Special Requests   Final    NONE Performed at [facility], [place] [address], [place], [place] [address]    Gram Stain   Final    WBC PRESENT,BOTH PMN AND MONONUCLEAR NO ORGANISMS SEEN Gram  Stain Report Called to,Read Back By and Verified With: GARRISON,G. RN @1850   01/18/20 BILLINGSLEY,L Performed at [facility], [place] [address] [place], [place] [address]    Culture   Final    NO GROWTH 3 DAYS Performed at [facility], [place] [address] [place], [place] [address]    Report Status 01/21/2020 FINAL  Final     Studies: No results found.   Flora Lipps, MD  [facility] 01/23/2020

## 2020-01-24 LAB — BASIC METABOLIC PANEL
Anion gap: 7 (ref 5–15)
BUN: 25 mg/dL — ABNORMAL HIGH (ref 8–23)
CO2: 27 mmol/L (ref 22–32)
Calcium: 9 mg/dL (ref 8.9–10.3)
Chloride: 98 mmol/L (ref 98–111)
Creatinine, Ser: 0.72 mg/dL (ref 0.61–1.24)
GFR calc Af Amer: >60 mL/min (ref >60)
GFR calc non Af Amer: >60 mL/min (ref >60)
Glucose, Bld: 116 mg/dL — ABNORMAL HIGH (ref 70–99)
Potassium: 4.3 mmol/L (ref 3.5–5.1)
Sodium: 132 mmol/L — ABNORMAL LOW (ref 135–145)

## 2020-01-24 LAB — MAGNESIUM: Magnesium: 1.8 mg/dL (ref 1.7–2.4)

## 2020-01-24 LAB — CBC
HCT: 24.6 % — ABNORMAL LOW (ref 39.0–52.0)
Hemoglobin: 7.9 g/dL — ABNORMAL LOW (ref 13.0–17.0)
MCH: 29.8 pg (ref 26.0–34.0)
MCHC: 32.1 g/dL (ref 30.0–36.0)
MCV: 92.8 fL (ref 80.0–100.0)
Platelets: 467 10*3/uL — ABNORMAL HIGH (ref 150–400)
RBC: 2.65 MIL/uL — ABNORMAL LOW (ref 4.22–5.81)
RDW: 16.6 % — ABNORMAL HIGH (ref 11.5–15.5)
WBC: 15.4 10*3/uL — ABNORMAL HIGH (ref 4.0–10.5)
nRBC: 0.7 % — ABNORMAL HIGH (ref 0.0–0.2)

## 2020-01-24 MED ORDER — ENSURE ENLIVE PO LIQD: 237.0000 mL | Freq: Two times a day (BID) | ORAL | 12 refills | Status: AC

## 2020-01-24 MED ORDER — DOCUSATE SODIUM 100 MG PO CAPS: 100.0000 mg | ORAL_CAPSULE | Freq: Two times a day (BID) | ORAL | Status: AC

## 2020-01-24 MED ORDER — TRAMADOL HCL 50 MG PO TABS: 50.0000 mg | ORAL_TABLET | Freq: Four times a day (QID) | ORAL | 0 refills | Status: AC | PRN

## 2020-01-24 NOTE — Progress Notes (Signed)
Called report to [place] at [facility].

## 2020-01-24 NOTE — Progress Notes (Signed)
Attempted to call report x2 no answer will try again later.

## 2020-01-24 NOTE — TOC Transition Note (Addendum)
Transition of Care [facility]) - CM/SW Discharge Note   Patient Details  Name: Shawn Hendrix MRN: [id number] Date of Birth: [birth year]  Transition of Care [facility]) CM/SW Contact:  Shawn Mage, LCSW Phone Number: 01/24/2020, 9:32 AM   Clinical Narrative:  According to MD, patient will d/c today.  Unable to leave v/m message for Shawn Hendrix.  Texted her and am awaiting confirmation. TOC will continue to follow during the course of hospitalization.  Addendum:  Shawn Hendrix called back, confirmed room number Shawn Hendrix.  Nursing, please call report to [phone]. PTAR arranged, brother informed. TOC sign off.     Final next level of care: [place] Barriers to Discharge: SNF Pending bed offer   Patient Goals and CMS Choice Patient states their goals for this hospitalization and ongoing recovery are:: "I'd rather go home, but you should talk to my bother Shawn Hendrix."   Choice offered to / list presented to : Patient, Sibling  Discharge Placement                       Discharge Plan and Services   Discharge Planning Services: CM Consult                                 Social Determinants of Health (SDOH) Interventions     Readmission Risk Interventions No flowsheet data found.

## 2020-01-24 NOTE — Progress Notes (Signed)
Physical Therapy Treatment Patient Details Name: Shawn Hendrix MRN: [id number] DOB: 1950-10-20 Today's Date: 01/24/2020    History of Present Illness 69 yo male admitted with weakness, L UE/knee pain, SIRS, acute gouty arthropathy. Hx of gout, OA, FTT, Sz, ETOH use, prostate cancer, L knee meniscal tear    PT Comments    Pt anticipates d/c home today.  Pt states SNF was recommended however he feels he is doing better and wants to d/c home.  Pt states he has RW at home to use as well.  If home, recommend HHPT and supervision for safety with mobility.    Follow Up Recommendations  Supervision for mobility/OOB;Home health PT     Equipment Recommendations  None recommended by PT    Recommendations for Other Services       Precautions / Restrictions Precautions Precautions: Fall    Mobility  Bed Mobility Overal bed mobility: Needs Assistance Bed Mobility: Supine to Sit;Sit to Supine     Supine to sit: Supervision Sit to supine: Supervision      Transfers Overall transfer level: Needs assistance Equipment used: Rolling walker (2 wheeled) Transfers: Sit to/from Stand Sit to Stand: Min guard         General transfer comment: verbal cues for safe technique  Ambulation/Gait Ambulation/Gait assistance: Min guard Gait Distance (Feet): 200 Feet Assistive device: Rolling walker (2 wheeled) Gait Pattern/deviations: Step-through pattern;Decreased stride length;Narrow base of support     General Gait Details: unsteady however no overt LOB or assist required, able to improve distance   Stairs             Wheelchair Mobility    Modified Rankin (Stroke Patients Only)       Balance Overall balance assessment: Needs assistance         Standing balance support: Bilateral upper extremity supported Standing balance-Leahy Scale: Poor Standing balance comment: reliant on UEs support                            Cognition Arousal/Alertness:  Awake/alert Behavior During Therapy: WFL for tasks assessed/performed Overall Cognitive Status: Within Functional Limits for tasks assessed                                        Exercises      General Comments        Pertinent Vitals/Pain Pain Assessment: No/denies pain    Home Living                      Prior Function            PT Goals (current goals can now be found in the care plan section) Progress towards PT goals: Progressing toward goals    Frequency    Min 3X/week      PT Plan Current plan remains appropriate    Co-evaluation              AM-PAC PT "6 Clicks" Mobility   Outcome Measure  Help needed turning from your back to your side while in a flat bed without using bedrails?: A Little Help needed moving from lying on your back to sitting on the side of a flat bed without using bedrails?: A Little Help needed moving to and from a bed to a chair (including a wheelchair)?: A Little Help needed standing  up from a chair using your arms (e.g., wheelchair or bedside chair)?: A Little Help needed to walk in hospital room?: A Little Help needed climbing 3-5 steps with a railing? : A Lot 6 Click Score: 17    End of Session Equipment Utilized During Treatment: Gait belt Activity Tolerance: Patient tolerated treatment well Patient left: with call bell/phone within reach;in bed;with bed alarm set   PT Visit Diagnosis: Muscle weakness (generalized) (M62.81);Other abnormalities of gait and mobility (R26.89)     Time: 1000-1016 PT Time Calculation (min) (ACUTE ONLY): 16 min  Charges:  $Gait Training: 8-22 mins                    Arlyce Dice, DPT Acute Rehabilitation Services Office: [phone]  Falicia Lizotte,KATHrine E 01/24/2020, 1:24 PM

## 2020-01-24 NOTE — Discharge Summary (Addendum)
Physician Discharge Summary  Shawn Hendrix IDC:[id number] DOB: 1951/07/23 DOA: 01/18/2020  PCP: Javier Docker, MD  Admit date: 01/18/2020 Discharge date: 01/24/2020  Admitted From: Home  Discharge disposition: SNF   Recommendations for Outpatient Follow-Up:   . Follow up with your primary care provider at the skilled nursing facility in 3-5 days. . Check CBC, BMP, MG in the next visit . Consider Ensure for moderate malnutrition.  Discharge Diagnosis:   Principal Problem:   SIRS (systemic inflammatory response syndrome) (HCC) Active Problems:   Anemia   Malnutrition of moderate degree   Acute gouty arthropathy   Hypomagnesemia   Essential hypertension   Alcohol abuse, in remission  Discharge Condition: Improved.  Diet recommendation: Low sodium, heart healthy.   Wound care: None.  Code status: Full.   History of Present Illness:   Shawn Trompeter Williamsonis a 69 y.o.malewith medical history significant of history of alcohol use disorder, hypertension, vitamin B-12 deficiency, anemia, gout, prostate cancer, malnutrition alcohol withdrawal, seizures, history of failure to thrive presented to the hospital with complaints of generalized weakness from home. He was found in the bed soaked in urine has been down for the past few days.Patient states he spent about 3 weeks at the nursing home but then was discharged back to home to care of his girlfriend. His left knee and left wrist has been bothering him and that only started after he was exposed to cold air.Recently was admitted for presumed sepsis secondary to colitis in end of April 2021 was discharged to SNF. Completed full course of treatment. He had generalized fatigue and had extensive work-up done including MRI brain MRI left knee and aspiration that was consistent with gout. Had an 2D  echogram done that showed preserved EF.  Patient was then admitted to hospital for further evaluation and treatment.   Hospital  Course:   Following conditions were addressed during hospitalization as listed below,  Generalized weakness, fatigue .  Patient was seen by physical therapy who recommended skilled nursing facility placement on discharge.    Systemic inflammatory response syndrome.  On presentation, likely from acute gouty arthritis.    Resolved at this time.    No other source of infection noted so far.  Patient met SIRS criteria with elevated white blood cell count tachycardia and fever. Blood cultures negative so far.   Synovial fluid culture was sent on 01/18/2020 showed no organisms.   Synovial fluid count showed WBC of 47,000.  Intracellular monosodium urate crystals were found.  Urine analysis was negative for infection.  COVID-19 test was negative.  Off antibiotics.  Blood cultures negative so far.  Acute gouty arthropathy significantly improved with IV Solu-Medrol, colchicine and allopurinol.  Will discontinue Solu-Medrol on discharge.  We will continue colchicine and allopurinol as outpatient.    Joint aspiration shows monosodium urate crystals.  No evidence of infection.  . Recent MRI of left knee as well which did not completely rule out osteo but given fluid showed no evidence of infectious process and cultures were negative most likely noninfectiousprocess.  Anemia-ferritin elevated but total iron low at 10 likely iron deficiency.  Vitamin B12 was 717.  Will monitor CBC.  Check fecal occult blood, pending.    Add iron sulfate on discharge. HIV nonreactive.  Malnutrition of moderate degree -nutritional evaluation appreciated.  Continue Ensure, Enlive.  Hyponatremia.  Sodium of 132 today  .  Monitor BMP periodically.  Essential hypertension -blood pressure is stable, continue Cardizem and lisinopril.  Hypomagnesemia-improved.  Received magnesium oxide. Magnesium 1.8 today.  Alcohol use disorderin remission -monitor for any sign of withdrawal.  Patient has not had a drink in more than  a week or so. Continue thiamine folic acid.  Disposition.  At this time, patient is stable for disposition to skilled nursing facility.  Medical Consultants:    None.  Procedures:    Left knee joint aspiration Subjective:   Today, patient feels okay.  Denies any knee pain.  Denies fever chills or rigor.  Has mild weakness.  Discharge Exam:   Vitals:   01/24/20 0552 01/24/20 0927  BP: 129/82 118/75  Pulse: 71 63  Resp: 17   Temp: 98.2 F (36.8 C)   SpO2: 100%    Vitals:   01/23/20 1359 01/23/20 2207 01/24/20 0552 01/24/20 0927  BP: 119/79 117/75 129/82 118/75  Pulse: 66 65 71 63  Resp: 16  17   Temp: 98.3 F (36.8 C) 98 F (36.7 C) 98.2 F (36.8 C)   TempSrc: Oral Oral Oral   SpO2: 100% 98% 100%   Weight:      Height:        Thinly built, not in obvious distress, awake communicative HENT:   No scleral pallor or icterus noted. Oral mucosa is moist.  Enucleated right eye.  Poor dentition. Chest:  Clear breath sounds.  Diminished breath sounds bilaterally. No crackles or wheezes.  CVS: S1 &S2 heard. No murmur.  Regular rate and rhythm. Abdomen: Soft, nontender, nondistended.  Bowel sounds are heard.   Extremities: No cyanosis, clubbing or edema.  Peripheral pulses are palpable.  Left knee with increased range of movement with mild tenderness on palpation. Psych: Alert, awake and communicative, normal mood CNS:  No cranial nerve deficits.  Power equal in all extremities.  Generalized weakness noted Skin: Warm and dry.  No rashes noted.  The results of significant diagnostics from this hospitalization (including imaging, microbiology, ancillary and laboratory) are listed below for reference.     Diagnostic Studies:   DG Wrist Complete Left  Result Date: 01/18/2020 CLINICAL DATA:  Bilateral wrist pain. The patient reports a history of gout. EXAM: LEFT WRIST - COMPLETE 3+ VIEW COMPARISON:  Plain films of the left hand 11/28/2019. FINDINGS: No acute bony or joint  abnormality. The proximal pole the scaphoid is severely diminutive and thinned. Severe proximal migration of the capitate is present. There is a large defect in the articular surface of the distal radius and adjacent capitate. Erosive change is also seen at the distal ulna. 2 loose osseous fragments project in the posterior aspect of the radiocarpal joint measuring up to approximately 1 cm in diameter. Soft tissues about the wrist are severely swollen. No chondrocalcinosis of the triangular fibrocartilage. No acute bony abnormality. Visualized MCP joints appear normal. IMPRESSION: No acute finding. Severe SLAC wrist. Cause for this finding is not definitely identified but may be due to remote avascular necrosis the scaphoid, trauma or arthropathy such as gout. The proximal pole the scaphoid is largely absent. Erosive change in the ulnar styloid and soft tissue swelling about the wrist are nonspecific but could be due to crystal or inflammatory arthropathy. Electronically Signed   By: Inge Rise M.D.   On: 01/18/2020 14:46   DG Wrist Complete Right  Result Date: 01/18/2020 CLINICAL DATA:  BILATERAL wrist and knee pain, history of gout, prostate cancer, hypertension, arthritis EXAM: RIGHT WRIST - COMPLETE 3+ VIEW COMPARISON:  None FINDINGS: Osseous demineralization. Larger erosion at the radial margin of the  mid scaphoid. Mild scattered chondrocalcinosis. Joint space narrowing radiocarpal joint. Widening of scapholunate interval consistent with torn scapholunate ligament. No acute fracture, dislocation, or bone destruction. Significant soft tissue swelling at dorsum of hand overlying the CMC joints and proximal metacarpals. IMPRESSION: Scattered degenerative changes and chondrocalcinosis question CPPD. Torn scapholunate ligament. Erosion at mid scaphoid highly suggestive of gout. Electronically Signed   By: Lavonia Dana M.D.   On: 01/18/2020 14:31   DG Chest Port 1 View  Result Date: 01/18/2020 CLINICAL  DATA:  Fever EXAM: PORTABLE CHEST 1 VIEW COMPARISON:  11/28/2019 FINDINGS: The heart size and mediastinal contours are stable. Atherosclerotic calcification of the aortic knob. No focal airspace consolidation, pleural effusion, or pneumothorax. Severe degenerative changes of the bilateral shoulders. IMPRESSION: No active disease. Electronically Signed   By: Davina Poke D.O.   On: 01/18/2020 13:29   DG Knee Complete 4 Views Left  Result Date: 01/18/2020 CLINICAL DATA:  LEFT knee pain, history gout EXAM: LEFT KNEE - COMPLETE 4+ VIEW COMPARISON:  None FINDINGS: Osseous demineralization. Scattered degenerative changes with joint space narrowing greatest at medial compartment. Spurring and fragmentation at inferior margin of patella again identified. Overlying anterior soft tissue swelling of the LEFT knee. Small associated LEFT knee joint effusion. No acute fracture, dislocation or bone destruction. Old healed proximal fibular metadiaphyseal fracture. Extensive small vessel vascular disease changes at knee and in runoff vessels. IMPRESSION: Degenerative changes of the LEFT knee with irregularity, spurring and fragmentation at the inferior pole of the patella, could represent a posttraumatic or inflammatory etiology, associated with persistent overlying soft tissue swelling and small joint effusion. Osteomyelitis not completely excluded and if there is clinical evidence of infection, recommend joint aspiration. These results will be called to the ordering clinician or representative by the Radiologist Assistant, and communication documented in the PACS or [employer]. Electronically Signed   By: Lavonia Dana M.D.   On: 01/18/2020 14:38     Labs:   Basic Metabolic Panel: Recent Labs  Lab 01/18/20 1258 01/18/20 2111 01/19/20 0809 01/19/20 0809 01/20/20 0543 01/20/20 0543 01/21/20 8119 01/21/20 0519 01/22/20 0449 01/22/20 0449 01/23/20 0503 01/24/20 0558  NA   < >  --  131*   < > 129*  --   133*  --  133*  --  136 132*  K   < >  --  3.7   < > 3.7   < > 3.8   < > 3.7   < > 4.1 4.3  CL   < >  --  97*   < > 99  --  102  --  101  --  102 98  CO2   < >  --  21*   < > 22  --  22  --  23  --  23 27  GLUCOSE   < >  --  95   < > 99  --  144*  --  117*  --  121* 116*  BUN   < >  --  14   < > 17  --  30*  --  33*  --  29* 25*  CREATININE   < >  --  0.78   < > 0.79  --  1.07  --  0.71  --  0.67 0.72  CALCIUM   < >  --  8.1*   < > 8.3*  --  8.7*  --  8.8*  --  8.9 9.0  MG   < >  1.4* 1.7   < > 1.7  --  1.9  --  1.9  --  1.9 1.8  PHOS  --  2.8 3.5  --   --   --   --   --   --   --   --   --    < > = values in this interval not displayed.   GFR Estimated Creatinine Clearance: 61.6 mL/min (by C-G formula based on SCr of 0.72 mg/dL). Liver Function Tests: Recent Labs  Lab 01/18/20 1258 01/19/20 0809  AST 15 13*  ALT 11 11  ALKPHOS 49 44  BILITOT 1.5* 1.5*  PROT 7.5 6.4*  ALBUMIN 3.2* 2.6*   No results for input(s): LIPASE, AMYLASE in the last 168 hours. No results for input(s): AMMONIA in the last 168 hours. Coagulation profile Recent Labs  Lab 01/18/20 1258  INR 1.1    CBC: Recent Labs  Lab 01/18/20 1258 01/18/20 1258 01/19/20 0809 01/19/20 0809 01/20/20 0543 01/21/20 0519 01/22/20 0449 01/23/20 0503 01/24/20 0558  WBC 12.6*   < > 13.0*   < > 11.6* 14.5* 14.6* 14.2* 15.4*  NEUTROABS 10.7*  --  11.2*  --   --   --   --   --   --   HGB 8.7*   < > 8.2*   < > 7.9* 7.7* 7.5* 7.6* 7.9*  HCT 26.4*   < > 25.2*   < > 24.7* 23.9* 22.6* 23.6* 24.6*  MCV 94.0   < > 93.7   < > 93.9 93.0 93.8 92.5 92.8  PLT 284   < > 252   < > 278 312 376 425* 467*   < > = values in this interval not displayed.   Cardiac Enzymes: Recent Labs  Lab 01/18/20 1258  CKTOTAL 19*   BNP: Invalid input(s): POCBNP CBG: No results for input(s): GLUCAP in the last 168 hours. D-Dimer No results for input(s): DDIMER in the last 72 hours. Hgb A1c No results for input(s): HGBA1C in the last 72  hours. Lipid Profile No results for input(s): CHOL, HDL, LDLCALC, TRIG, CHOLHDL, LDLDIRECT in the last 72 hours. Thyroid function studies No results for input(s): TSH, T4TOTAL, T3FREE, THYROIDAB in the last 72 hours.  Invalid input(s): FREET3 Anemia work up No results for input(s): VITAMINB12, FOLATE, FERRITIN, TIBC, IRON, RETICCTPCT in the last 72 hours. Microbiology Recent Results (from the past 240 hour(s))  Blood Culture (routine x 2)     Status: None   Collection Time: 01/18/20 12:49 PM   Specimen: BLOOD RIGHT FOREARM  Result Value Ref Range Status   Specimen Description   Final    BLOOD RIGHT FOREARM Performed at [facility] [address], [place], [place] [address]    Special Requests   Final    BOTTLES DRAWN AEROBIC AND ANAEROBIC Blood Culture adequate volume Performed at [facility] [address], [place], [place] [address]    Culture   Final    NO GROWTH 5 DAYS Performed at [facility], [place] [address] [place], [place] [address]    Report Status 01/23/2020 FINAL  Final  Urine culture     Status: None   Collection Time: 01/18/20 12:49 PM   Specimen: In/Out Cath Urine  Result Value Ref Range Status   Specimen Description   Final    IN/OUT CATH URINE Performed at [facility] [address] [place], [place] [address]    Special Requests  Final    NONE Performed at [facility], [place] [address] [place], [place] [address]    Culture   Final    NO GROWTH Performed at [facility], [place] [address], [place], [place] [address]    Report Status 01/20/2020 FINAL  Final  SARS Coronavirus 2 by RT PCR (hospital order, performed in [facility] hospital lab) Nasopharyngeal Nasopharyngeal Swab     Status: None   Collection Time: 01/18/20 12:50 PM   Specimen: Nasopharyngeal Swab  Result Value Ref Range Status   SARS Coronavirus 2 NEGATIVE NEGATIVE Final    Comment:  (NOTE) SARS-CoV-2 target nucleic acids are NOT DETECTED. The SARS-CoV-2 RNA is generally detectable in upper and lower respiratory specimens during the acute phase of infection. The lowest concentration of SARS-CoV-2 viral copies this assay can detect is 250 copies / mL. A negative result does not preclude SARS-CoV-2 infection and should not be used as the sole basis for treatment or other patient management decisions.  A negative result may occur with improper specimen collection / handling, submission of specimen other than nasopharyngeal swab, presence of viral mutation(s) within the areas targeted by this assay, and inadequate number of viral copies (<250 copies / mL). A negative result must be combined with clinical observations, patient history, and epidemiological information. Fact Sheet for Patients:   StrictlyIdeas.no Fact Sheet for Healthcare Providers: BankingDealers.co.za This test is not yet approved or cleared  by the [place] FDA and has been authorized for detection and/or diagnosis of SARS-CoV-2 by FDA under an Emergency Use Authorization (EUA).  This EUA will remain in effect (meaning this test can be used) for the duration of the COVID-19 declaration under Section 564(b)(1) of the Act, 21 U.S.C. section 360bbb-3(b)(1), unless the authorization is terminated or revoked sooner. Performed at [facility], [place] [address], [place], [place] [address]   Blood Culture (routine x 2)     Status: None   Collection Time: 01/18/20 12:54 PM   Specimen: BLOOD LEFT FOREARM  Result Value Ref Range Status   Specimen Description   Final    BLOOD LEFT FOREARM Performed at [facility] [address], [place], [place] [address]    Special Requests   Final    BOTTLES DRAWN AEROBIC ONLY Blood Culture adequate volume Performed at [facility] [address] [place],  [place] [address]    Culture   Final    NO GROWTH 5 DAYS Performed at [facility], [place] [address] [place], [place] [address]    Report Status 01/23/2020 FINAL  Final  Body fluid culture     Status: None   Collection Time: 01/18/20  5:06 PM   Specimen: Synovium; Body Fluid  Result Value Ref Range Status   Specimen Description   Final    SYNOVIAL KNEE Performed at [facility] [address] [place], [place] [address]    Special Requests   Final    NONE Performed at [facility], [place] [address] [place], [place] [address]    Gram Stain   Final    WBC PRESENT,BOTH PMN AND MONONUCLEAR NO ORGANISMS SEEN Gram Stain Report Called to,Read Back By and Verified With: GARRISON,G. RN _0   01/18/20 BILLINGSLEY,L Performed at [facility], [place] [address], [place], [place] [address]    Culture   Final    NO GROWTH 3 DAYS Performed at [facility], [place]  [address], [place], [place] [address]    Report Status 01/21/2020 FINAL  Final     Discharge Instructions:   Discharge Instructions    Diet - low sodium heart healthy   Complete by: As directed    Discharge instructions   Complete by: As directed    Follow-up with your primary care provider at the skilled nursing facility in 3 to 5 days.  Check CBC, BMP, magnesium in the next visit.   Increase activity slowly   Complete by: As directed      Allergies as of 01/24/2020   No Known Allergies     Medication List    STOP taking these medications   magnesium oxide 400 (241.3 Mg) MG tablet Commonly known as: MAG-OX     TAKE these medications   acetaminophen 325 MG tablet Commonly known as: TYLENOL Take 2 tablets (650 mg total) by mouth every 6 (six) hours as needed for mild pain (or Fever >/= 101).   allopurinol 100 MG tablet Commonly known as: ZYLOPRIM Take 1 tablet (100 mg total) by mouth daily.   colchicine 0.6 MG tablet Take 1 tablet (0.6 mg total) by mouth  daily.   diltiazem 240 MG 24 hr capsule Commonly known as: CARDIZEM CD Take 1 capsule (240 mg total) by mouth daily. What changed: Another medication with the same name was removed. Continue taking this medication, and follow the directions you see here.   docusate sodium 100 MG capsule Commonly known as: COLACE Take 1 capsule (100 mg total) by mouth 2 (two) times daily.   feeding supplement Liqd Take 1 Container by mouth 3 (three) times daily between meals.   ferrous sulfate 325 (65 FE) MG tablet Take 1 tablet (325 mg total) by mouth daily with breakfast.   folic acid 1 MG tablet Commonly known as: FOLVITE Take 1 tablet (1 mg total) by mouth daily.   gabapentin 300 MG capsule Commonly known as: NEURONTIN Take 300 mg by mouth 2 (two) times daily as needed (pain).   lisinopril 20 MG tablet Commonly known as: ZESTRIL Take 1 tablet (20 mg total) by mouth daily.   magnesium oxide 400 MG tablet Commonly known as: MAG-OX Take 1 tablet by mouth daily.   multivitamin Tabs tablet Take 1 tablet by mouth daily.   omeprazole 20 MG capsule Commonly known as: PRILOSEC Take 20 mg by mouth 2 (two) times daily. What changed: Another medication with the same name was removed. Continue taking this medication, and follow the directions you see here.   thiamine 100 MG tablet Take 1 tablet (100 mg total) by mouth daily.   traMADol 50 MG tablet Commonly known as: ULTRAM Take 1 tablet (50 mg total) by mouth every 6 (six) hours as needed.   vitamin B-12 1000 MCG tablet Commonly known as: CYANOCOBALAMIN Take 1 tablet (1,000 mcg total) by mouth daily.      Contact information for after-discharge care    [facility] .   Service: Skilled Nursing Contact information: [place] [phone]               Time coordinating discharge: 39 minutes  Signed:  Deshundra Waller  [facility] 01/24/2020, 9:46  AM

## 2021-01-15 IMAGING — DX DG KNEE COMPLETE 4+V*L*
4 series · 4 of 4 positions shown · non-contrast
Comparison: None

CLINICAL DATA: LEFT knee pain, history gout

EXAM:
LEFT KNEE - COMPLETE 4+ VIEW

[knee ap]
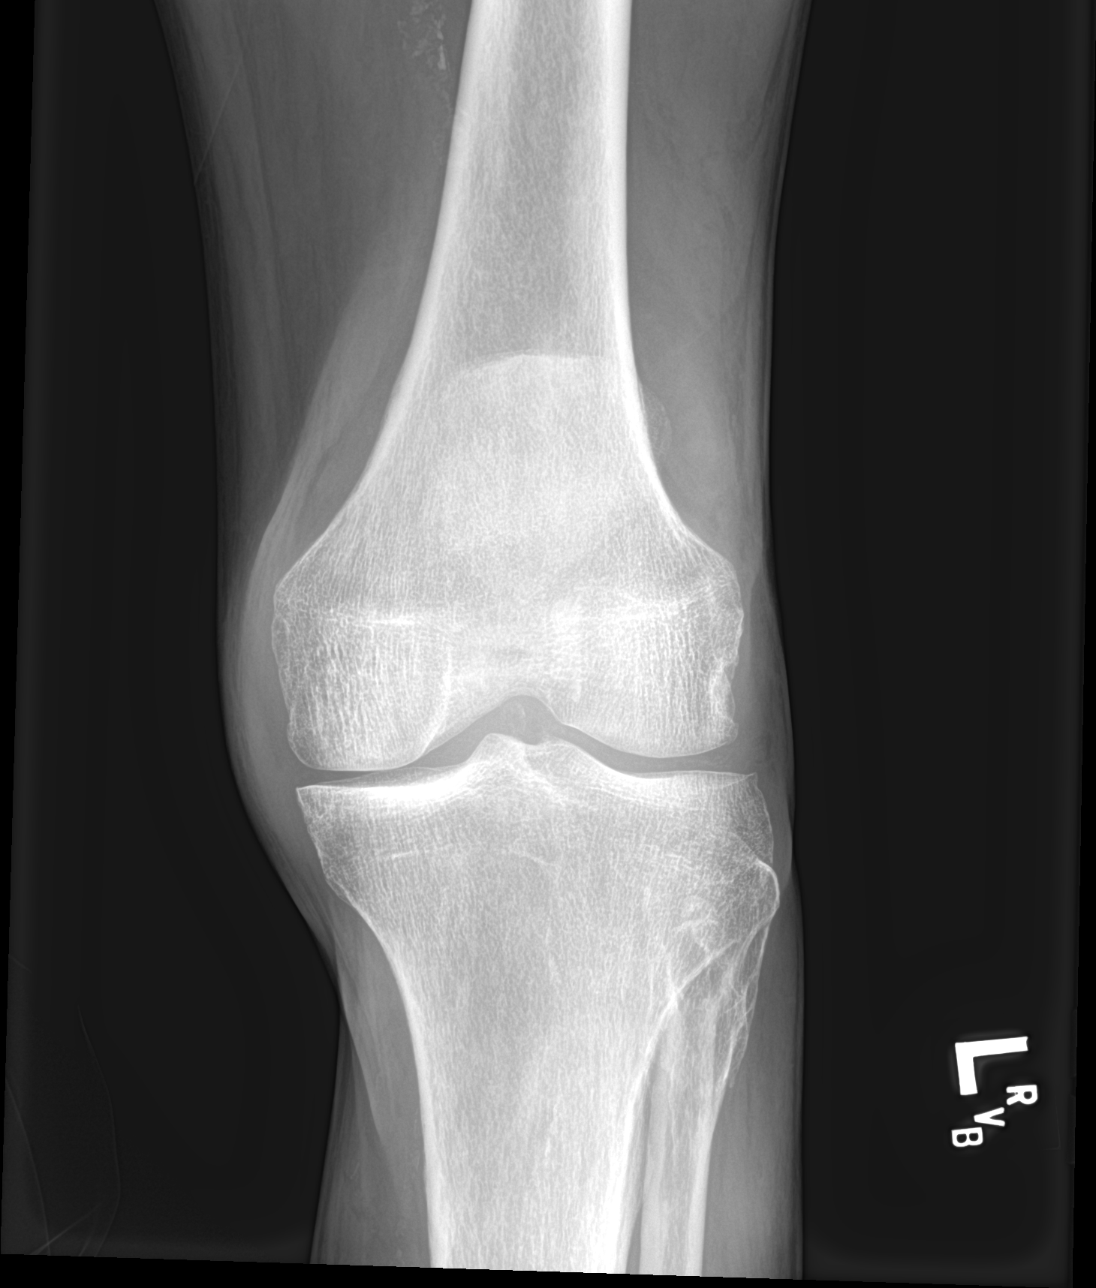

[knee lat]
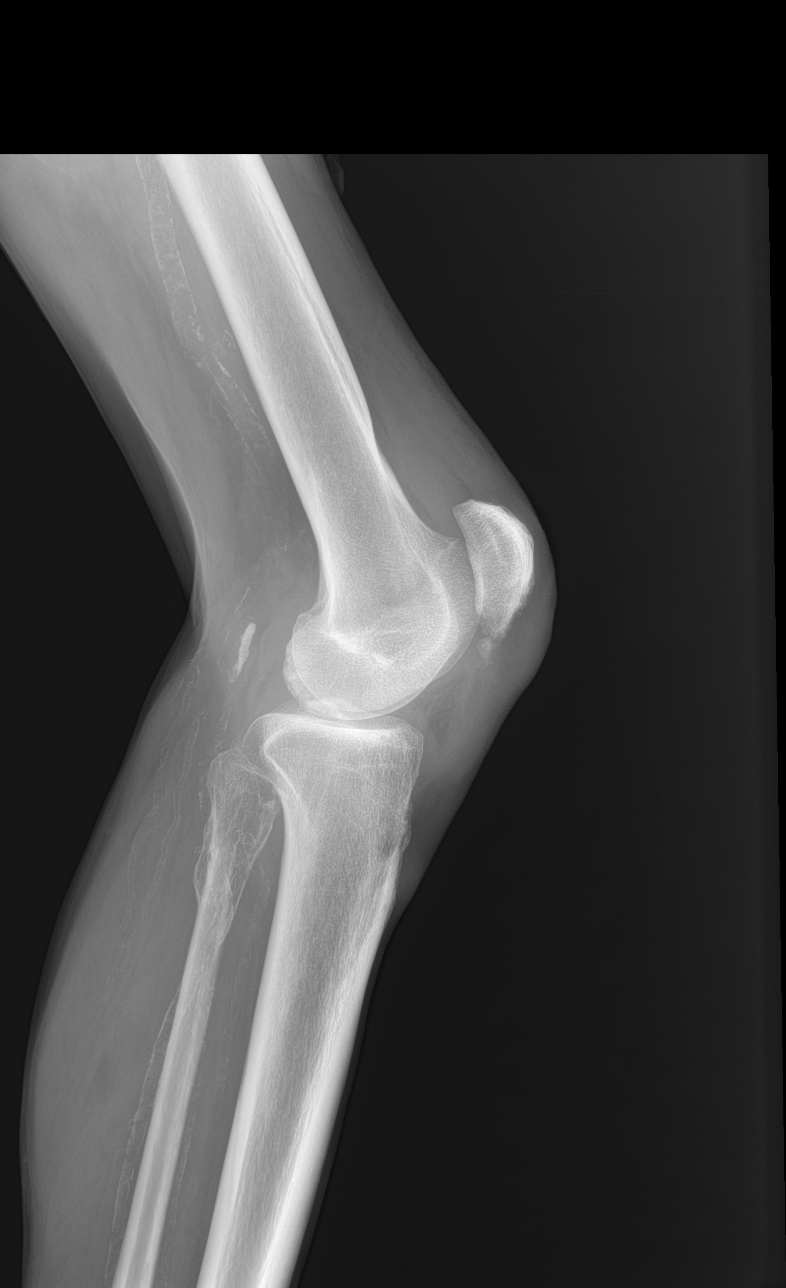

[knee obl (1 of 2)]
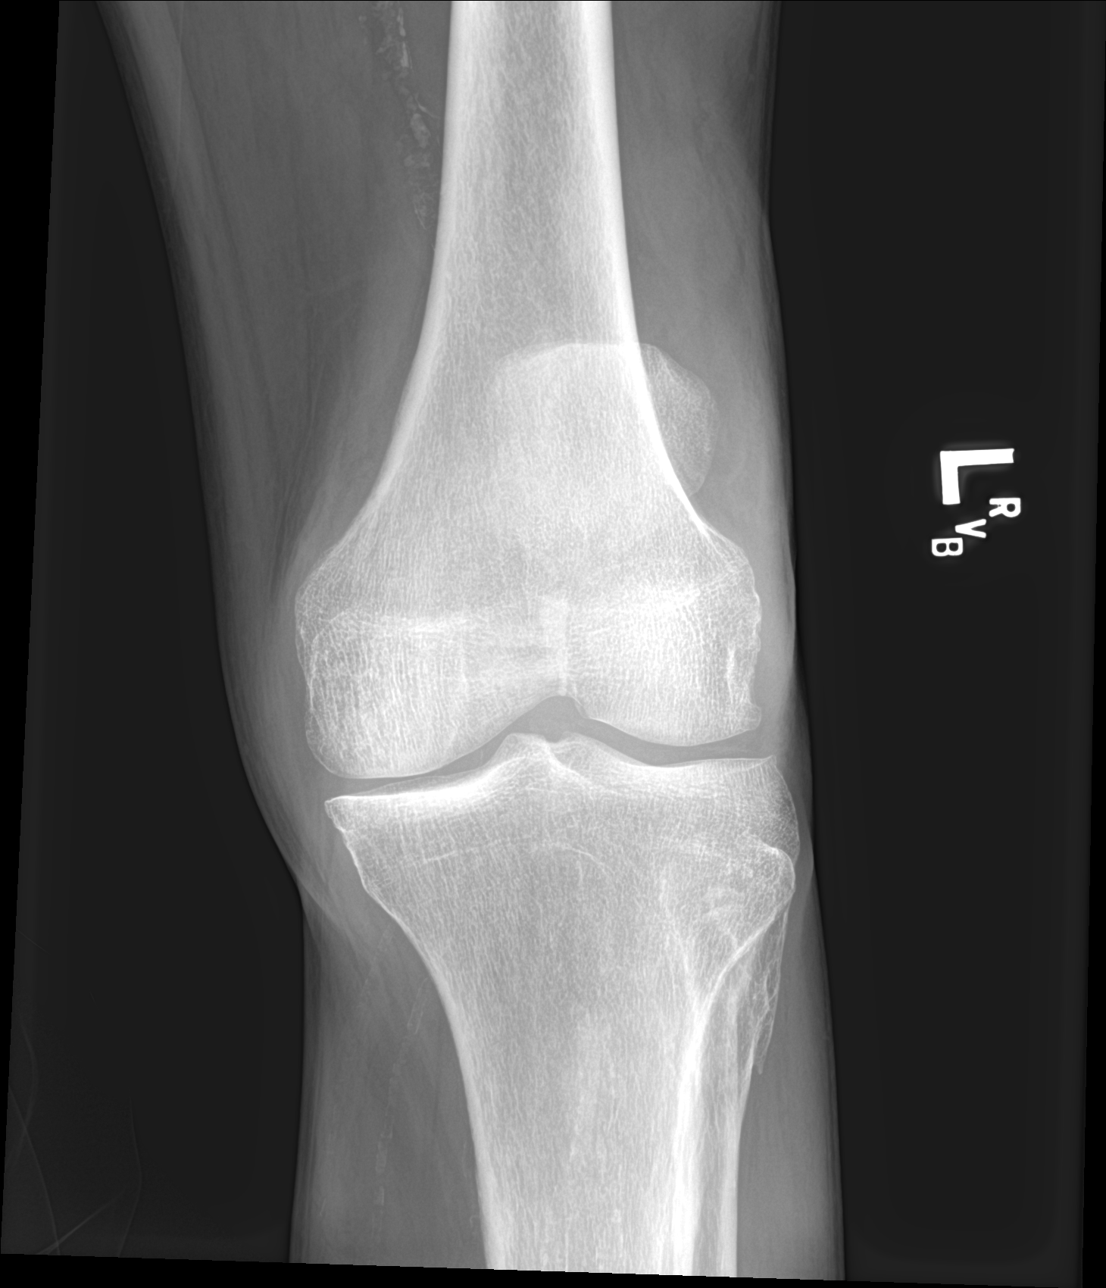

[knee obl (2 of 2)]
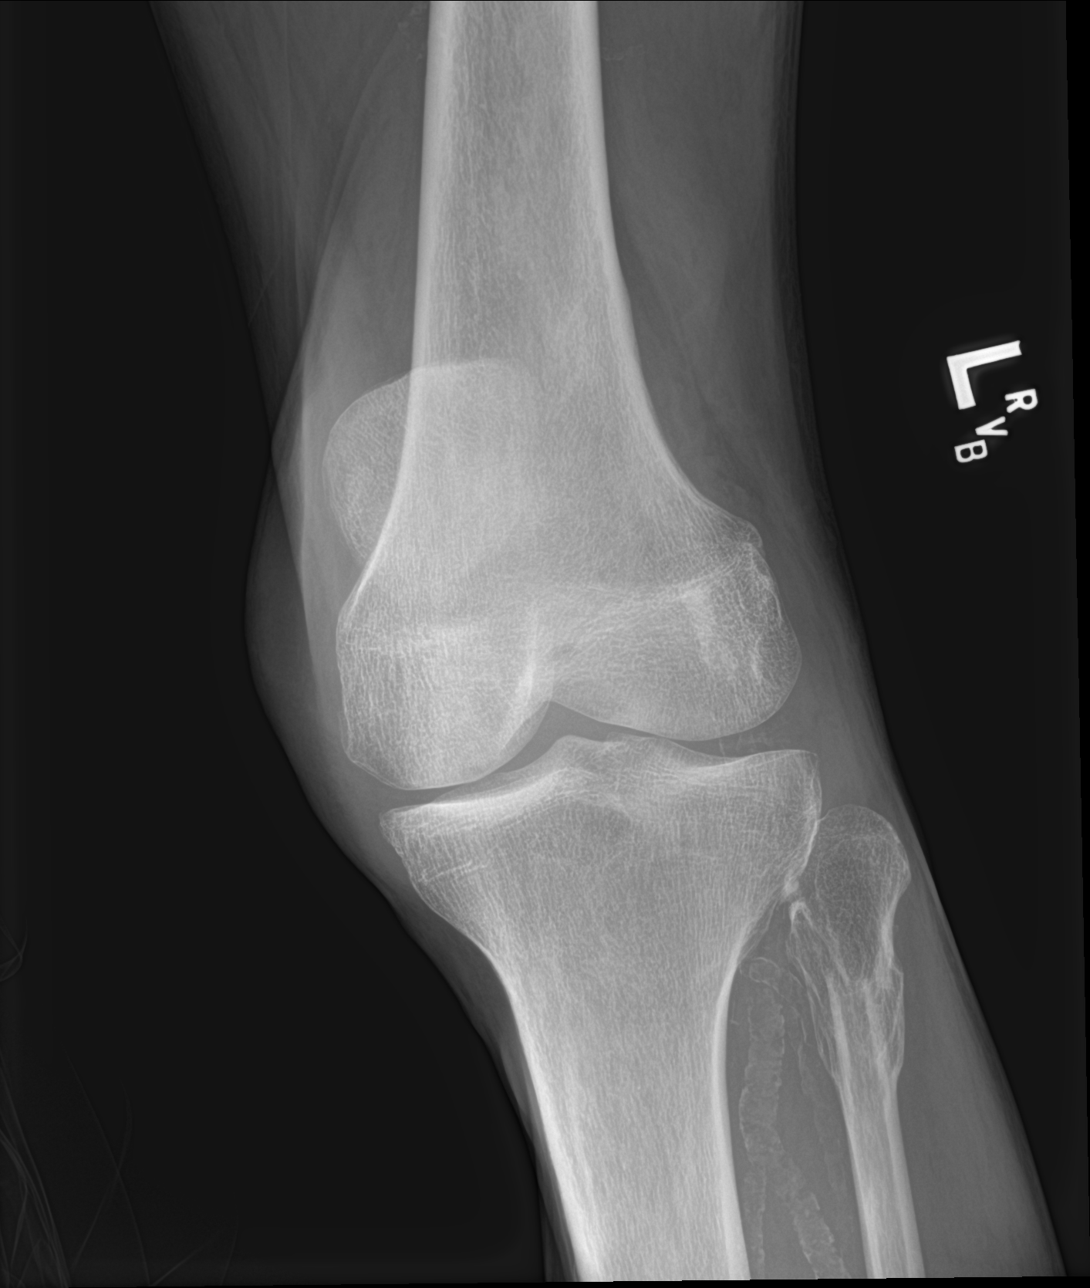

[4 of 4 positions shown; findings below may reference images not displayed]

FINDINGS: Osseous demineralization.

Scattered degenerative changes with joint space narrowing greatest
at medial compartment.

Spurring and fragmentation at inferior margin of patella again
identified.

Overlying anterior soft tissue swelling of the LEFT knee.

Small associated LEFT knee joint effusion.

No acute fracture, dislocation or bone destruction.

Old healed proximal fibular metadiaphyseal fracture.

Extensive small vessel vascular disease changes at knee and in
runoff vessels.
IMPRESSION: Degenerative changes of the LEFT knee with irregularity, spurring
and fragmentation at the inferior pole of the patella, could
represent a posttraumatic or inflammatory etiology, associated with
persistent overlying soft tissue swelling and small joint effusion.

Osteomyelitis not completely excluded and if there is clinical
evidence of infection, recommend joint aspiration.

These results will be called to the ordering clinician or
representative by the Radiologist Assistant, and communication
documented in the PACS or [REDACTED].

## 2021-09-24 ENCOUNTER — Encounter: Payer: Self-pay | Admitting: Family Medicine

## 2022-07-09 ENCOUNTER — Other Ambulatory Visit: Payer: Self-pay | Admitting: Family Medicine

## 2022-07-09 ENCOUNTER — Ambulatory Visit
Admission: RE | Admit: 2022-07-09 | Discharge: 2022-07-09 | Disposition: A | Discharge status: Home / Self Care | Payer: Medicare Other | Source: Ambulatory Visit | Attending: Family Medicine | Admitting: Family Medicine

## 2022-07-09 DIAGNOSIS — R0989 Other specified symptoms and signs involving the circulatory and respiratory systems: Secondary | ICD-10-CM

## 2024-02-11 ENCOUNTER — Other Ambulatory Visit: Payer: Self-pay | Admitting: Family Medicine

## 2024-02-11 DIAGNOSIS — I719 Aortic aneurysm of unspecified site, without rupture: Secondary | ICD-10-CM

## 2024-02-25 ENCOUNTER — Inpatient Hospital Stay
Admission: RE | Admit: 2024-02-25 | Discharge: 2024-02-25 | Discharge status: Home / Self Care | Source: Ambulatory Visit | Attending: Family Medicine | Admitting: Family Medicine

## 2024-02-25 DIAGNOSIS — I719 Aortic aneurysm of unspecified site, without rupture: Secondary | ICD-10-CM

## 2024-02-25 MED ORDER — IOPAMIDOL (ISOVUE-370) INJECTION 76%
75.0000 mL | Freq: Once | INTRAVENOUS | Status: AC | PRN
  Administered 2024-02-25: 75 mL via INTRAVENOUS

## 2024-03-11 ENCOUNTER — Other Ambulatory Visit: Payer: Self-pay | Admitting: Family Medicine

## 2024-03-11 DIAGNOSIS — K7460 Unspecified cirrhosis of liver: Secondary | ICD-10-CM
